# Patient Record
Sex: Female | Born: 1955 | Race: Black or African American | Hispanic: No | Marital: Single | Smoking: Never smoker
Health system: Southern US, Community
[De-identification: ages and names within clinical notes are randomized; demographics above are authoritative.]

## PROBLEM LIST (undated history)

## (undated) DIAGNOSIS — I639 Cerebral infarction, unspecified: Secondary | ICD-10-CM

## (undated) DIAGNOSIS — I10 Essential (primary) hypertension: Secondary | ICD-10-CM

## (undated) DIAGNOSIS — E7800 Pure hypercholesterolemia, unspecified: Secondary | ICD-10-CM

## (undated) HISTORY — PX: APPENDECTOMY: SHX54

## (undated) HISTORY — PX: ABDOMINAL HYSTERECTOMY: SHX81

## (undated) HISTORY — DX: Cerebral infarction, unspecified: I63.9

---

## 2014-06-13 ENCOUNTER — Emergency Department (HOSPITAL_BASED_OUTPATIENT_CLINIC_OR_DEPARTMENT_OTHER)
Admission: EM | Admit: 2014-06-13 | Discharge: 2014-06-13 | Disposition: A | Discharge status: Home / Self Care | Payer: No Typology Code available for payment source | Attending: Emergency Medicine | Admitting: Emergency Medicine

## 2014-06-13 ENCOUNTER — Encounter (HOSPITAL_BASED_OUTPATIENT_CLINIC_OR_DEPARTMENT_OTHER): Payer: Self-pay | Admitting: Emergency Medicine

## 2014-06-13 DIAGNOSIS — E7800 Pure hypercholesterolemia, unspecified: Secondary | ICD-10-CM | POA: Insufficient documentation

## 2014-06-13 DIAGNOSIS — T465X5A Adverse effect of other antihypertensive drugs, initial encounter: Secondary | ICD-10-CM | POA: Insufficient documentation

## 2014-06-13 DIAGNOSIS — I10 Essential (primary) hypertension: Secondary | ICD-10-CM | POA: Insufficient documentation

## 2014-06-13 DIAGNOSIS — T783XXA Angioneurotic edema, initial encounter: Secondary | ICD-10-CM | POA: Insufficient documentation

## 2014-06-13 DIAGNOSIS — R221 Localized swelling, mass and lump, neck: Secondary | ICD-10-CM

## 2014-06-13 DIAGNOSIS — R220 Localized swelling, mass and lump, head: Secondary | ICD-10-CM | POA: Insufficient documentation

## 2014-06-13 DIAGNOSIS — Z79899 Other long term (current) drug therapy: Secondary | ICD-10-CM | POA: Diagnosis not present

## 2014-06-13 HISTORY — DX: Pure hypercholesterolemia, unspecified: E78.00

## 2014-06-13 HISTORY — DX: Essential (primary) hypertension: I10

## 2014-06-13 NOTE — ED Provider Notes (Addendum)
CSN: [id number]     Arrival date & time 06/13/14  0272 History   First MD Initiated Contact with Patient 06/13/14 0708     No chief complaint on file.  Chief complaint swelling of face  (Consider location/radiation/quality/duration/timing/severity/associated sxs/prior Treatment) HPI Patient noticed swelling of her face and lips onset yesterday 12:15 PM. She was seen at her PMDs office. Lisinopril was stopped. She was administered Depo-Medrol, also told to take Benadryl. She presents today as swelling continues. She denies difficulty speaking, swallowing or breathing. No other associated symptoms. She feels slightly worse today. No past medical history on file. past medical history hypertension hypercholesterolemia No past surgical history on file. No family history on file. History  Substance Use Topics  . Smoking status: Not on file  . Smokeless tobacco: Not on file  . Alcohol Use: Not on file   no tobacco no alcohol no drug OB History   No data available     Review of Systems  Constitutional: Negative.   HENT: Positive for facial swelling.   Respiratory: Negative.   Cardiovascular: Negative.   Gastrointestinal: Negative.   Musculoskeletal: Negative.   Skin: Negative.   Neurological: Negative.   Psychiatric/Behavioral: Negative.   All other systems reviewed and are negative.     Allergies  Review of patient's allergies indicates not on file.  Home Medications  Atorvastatin, Zantac, Norvasc Benadryl, HCTZ,lisinopril; Prior to Admission medications   Not on File   BP 163/92  Pulse 100  Temp(Src) 98.2 F (36.8 C) (Oral)  Resp 18  Ht  (1.626 m)  Wt 180 lb (81.647 kg)  BMI 30.88 kg/m2  SpO2 100% Physical Exam  Nursing note and vitals reviewed. Constitutional: She appears well-developed and well-nourished. No distress.  Healing secretions well no distress  HENT:  Head: Normocephalic and atraumatic.  Right Ear: External ear normal.  Left Ear: External ear  normal.  Mouth/Throat: Oropharynx is clear and moist.  Lips swollen cheeks are slightly swollen. No swelling of tongue or uvula  Eyes: Conjunctivae are normal. Pupils are equal, round, and reactive to light.  Neck: Neck supple. No tracheal deviation present. No thyromegaly present.  Cardiovascular: Normal rate and regular rhythm.   No murmur heard. Pulmonary/Chest: Effort normal and breath sounds normal.  Speaks in paragraphs no respiratory distress  Abdominal: Soft. Bowel sounds are normal. She exhibits no distension. There is no tenderness.  Musculoskeletal: Normal range of motion. She exhibits no edema and no tenderness.  Neurological: She is alert. Coordination normal.  Skin: Skin is warm and dry. No rash noted.  Psychiatric: She has a normal mood and affect.    ED Course  Procedures (including critical care time) Labs Review Labs Reviewed - No data to display  Imaging Review No results found.   EKG Interpretation None      MDM  No further treatment indicated presently. I've advised patient to watch for difficulty swallowing breathing or speaking. Return immediately if she develops any of those problems .She is to stay off lisinopril. Final diagnoses:  None   Diagnosis angioedema     Doug Sou, MD 06/13/14 5366  Doug Sou, MD 06/13/14 [phone]

## 2014-06-13 NOTE — Discharge Instructions (Signed)
Angioedema Your body is having a reaction to lisinopril. You cannot take more lisinopril or the class of medications known as ACE inhibitors. Return immediately for difficulty speaking swallowing or breathing. Angioedema is sudden puffiness (swelling), often of the skin. It can happen:  On your face or privates (genitals).  In your belly (abdomen) or other body parts. It usually happens quickly and gets better in 1 or 2 days. It often starts at night and is found when you wake up. You may get red, itchy patches of skin (hives). Attacks can be dangerous if your breathing passages get puffy. The condition may happen only once, or it can come back at random times. It may happen for several years before it goes away for good. HOME CARE  Only take medicines as told by your doctor.  Always carry your emergency allergy medicines with you.  Wear a medical bracelet as told by your doctor.  Avoid things that you know will cause attacks (triggers). GET HELP IF:  You have another attack.  Your attacks happen more often or get worse.  The condition was passed to you by your parents and you want to have children. GET HELP RIGHT AWAY IF:   Your mouth, tongue, or lips are very puffy.  You have trouble breathing.  You have trouble swallowing.  You pass out (faint). MAKE SURE YOU:   Understand these instructions.  Will watch your condition.  Will get help right away if you are not doing well or get worse. Document Released: 09/13/2009 Document Revised: 07/16/2013 Document Reviewed: 05/19/2013 [facility] Patient Information 2015 [place], [place]. This information is not intended to replace advice given to you by your health care provider. Make sure you discuss any questions you have with your health care provider.

## 2014-06-13 NOTE — ED Notes (Signed)
Lip swelling secondary to lisinopril. Went to dr yesterday for same.

## 2021-01-23 ENCOUNTER — Observation Stay (HOSPITAL_COMMUNITY)
Admission: EM | Admit: 2021-01-23 | Discharge: 2021-01-24 | Disposition: A | Discharge status: Home / Self Care | Payer: Self-pay | Attending: Emergency Medicine | Admitting: Emergency Medicine

## 2021-01-23 ENCOUNTER — Encounter (HOSPITAL_COMMUNITY): Payer: Self-pay

## 2021-01-23 ENCOUNTER — Emergency Department (HOSPITAL_COMMUNITY): Payer: Self-pay

## 2021-01-23 ENCOUNTER — Other Ambulatory Visit: Payer: Self-pay

## 2021-01-23 DIAGNOSIS — Z79899 Other long term (current) drug therapy: Secondary | ICD-10-CM | POA: Insufficient documentation

## 2021-01-23 DIAGNOSIS — R112 Nausea with vomiting, unspecified: Secondary | ICD-10-CM | POA: Insufficient documentation

## 2021-01-23 DIAGNOSIS — E876 Hypokalemia: Secondary | ICD-10-CM | POA: Insufficient documentation

## 2021-01-23 DIAGNOSIS — G43829 Menstrual migraine, not intractable, without status migrainosus: Secondary | ICD-10-CM

## 2021-01-23 DIAGNOSIS — I1A0 Resistant hypertension: Secondary | ICD-10-CM

## 2021-01-23 DIAGNOSIS — E785 Hyperlipidemia, unspecified: Secondary | ICD-10-CM

## 2021-01-23 DIAGNOSIS — R42 Dizziness and giddiness: Principal | ICD-10-CM | POA: Insufficient documentation

## 2021-01-23 DIAGNOSIS — G43909 Migraine, unspecified, not intractable, without status migrainosus: Secondary | ICD-10-CM

## 2021-01-23 DIAGNOSIS — R519 Headache, unspecified: Secondary | ICD-10-CM

## 2021-01-23 DIAGNOSIS — I10 Essential (primary) hypertension: Secondary | ICD-10-CM | POA: Insufficient documentation

## 2021-01-23 DIAGNOSIS — Z20822 Contact with and (suspected) exposure to covid-19: Secondary | ICD-10-CM | POA: Insufficient documentation

## 2021-01-23 LAB — CBC WITH DIFFERENTIAL/PLATELET
Abs Immature Granulocytes: 0.09 10*3/uL — ABNORMAL HIGH (ref 0.00–0.07)
Basophils Absolute: 0 10*3/uL (ref 0.0–0.1)
Basophils Relative: 0 %
Eosinophils Absolute: 0 10*3/uL (ref 0.0–0.5)
Eosinophils Relative: 0 %
HCT: 38.7 % (ref 36.0–46.0)
Hemoglobin: 12.4 g/dL (ref 12.0–15.0)
Immature Granulocytes: 1 %
Lymphocytes Relative: 7 %
Lymphs Abs: 1 10*3/uL (ref 0.7–4.0)
MCH: 28.4 pg (ref 26.0–34.0)
MCHC: 32 g/dL (ref 30.0–36.0)
MCV: 88.6 fL (ref 80.0–100.0)
Monocytes Absolute: 0.7 10*3/uL (ref 0.1–1.0)
Monocytes Relative: 5 %
Neutro Abs: 13.7 10*3/uL — ABNORMAL HIGH (ref 1.7–7.7)
Neutrophils Relative %: 87 %
Platelets: 389 10*3/uL (ref 150–400)
RBC: 4.37 MIL/uL (ref 3.87–5.11)
RDW: 16.8 % — ABNORMAL HIGH (ref 11.5–15.5)
WBC: 15.6 10*3/uL — ABNORMAL HIGH (ref 4.0–10.5)
nRBC: 0 % (ref 0.0–0.2)

## 2021-01-23 LAB — COMPREHENSIVE METABOLIC PANEL
ALT: 26 U/L (ref 0–44)
AST: 23 U/L (ref 15–41)
Albumin: 3.9 g/dL (ref 3.5–5.0)
Alkaline Phosphatase: 77 U/L (ref 38–126)
Anion gap: 11 (ref 5–15)
BUN: 11 mg/dL (ref 8–23)
CO2: 25 mmol/L (ref 22–32)
Calcium: 9.5 mg/dL (ref 8.9–10.3)
Chloride: 102 mmol/L (ref 98–111)
Creatinine, Ser: 0.74 mg/dL (ref 0.44–1.00)
GFR, Estimated: >60 mL/min (ref >60)
Glucose, Bld: 186 mg/dL — ABNORMAL HIGH (ref 70–99)
Potassium: 3.3 mmol/L — ABNORMAL LOW (ref 3.5–5.1)
Sodium: 138 mmol/L (ref 135–145)
Total Bilirubin: 0.5 mg/dL (ref 0.3–1.2)
Total Protein: 8.6 g/dL — ABNORMAL HIGH (ref 6.5–8.1)

## 2021-01-23 LAB — URINALYSIS, ROUTINE W REFLEX MICROSCOPIC
Bilirubin Urine: NEGATIVE
Glucose, UA: 150 mg/dL — AB
Hgb urine dipstick: NEGATIVE
Ketones, ur: 5 mg/dL — AB
Leukocytes,Ua: NEGATIVE
Nitrite: NEGATIVE
Protein, ur: NEGATIVE mg/dL
Specific Gravity, Urine: 1.011 (ref 1.005–1.030)
pH: 7 (ref 5.0–8.0)

## 2021-01-23 LAB — RESP PANEL BY RT-PCR (FLU A&B, COVID) ARPGX2
Influenza A by PCR: NEGATIVE
Influenza B by PCR: NEGATIVE
SARS Coronavirus 2 by RT PCR: NEGATIVE

## 2021-01-23 LAB — TROPONIN I (HIGH SENSITIVITY)
Troponin I (High Sensitivity): 4 ng/L (ref <18)
Troponin I (High Sensitivity): 3 ng/L (ref <18)

## 2021-01-23 IMAGING — CR DG CHEST 2V
2 series · 2 of 2 positions shown · non-contrast
Comparison: None.

CLINICAL DATA: Shortness of breath, dizziness

EXAM:
CHEST - 2 VIEW

[chest lat]
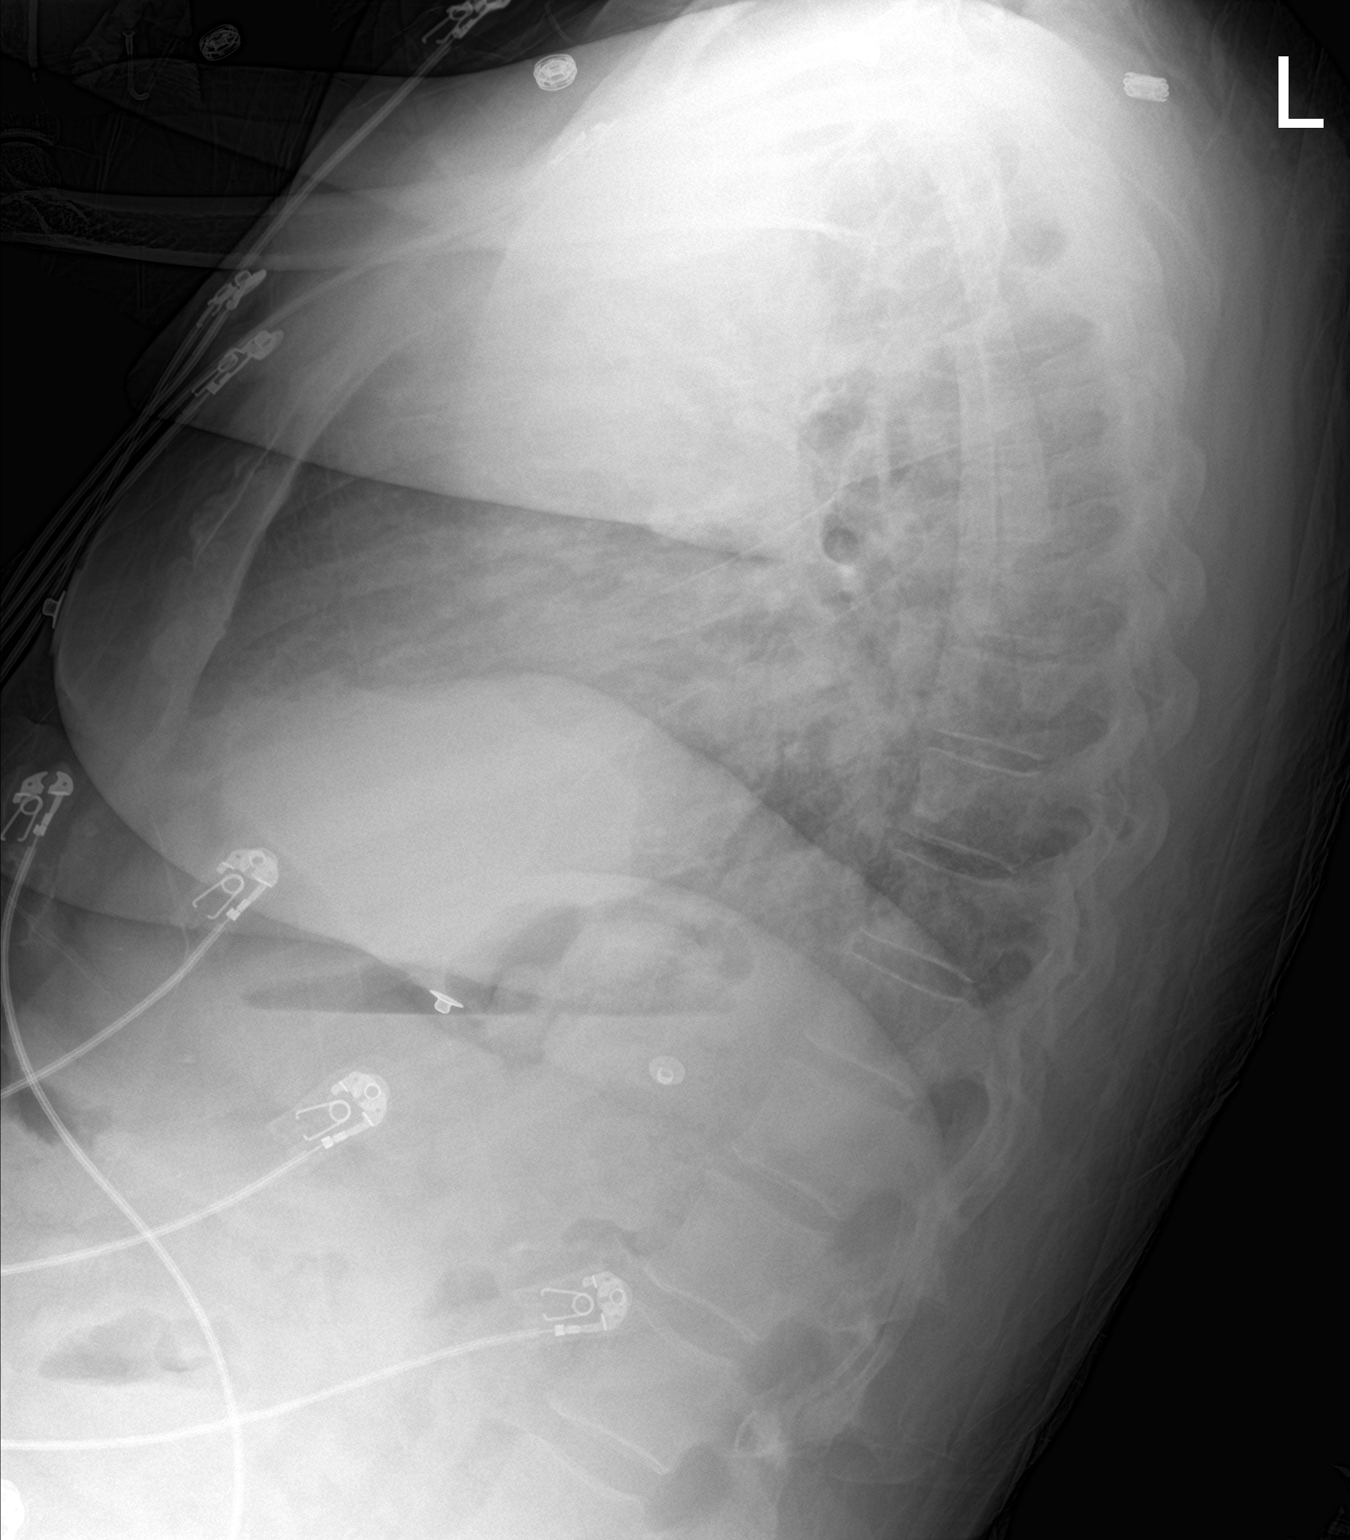

[chest ap]
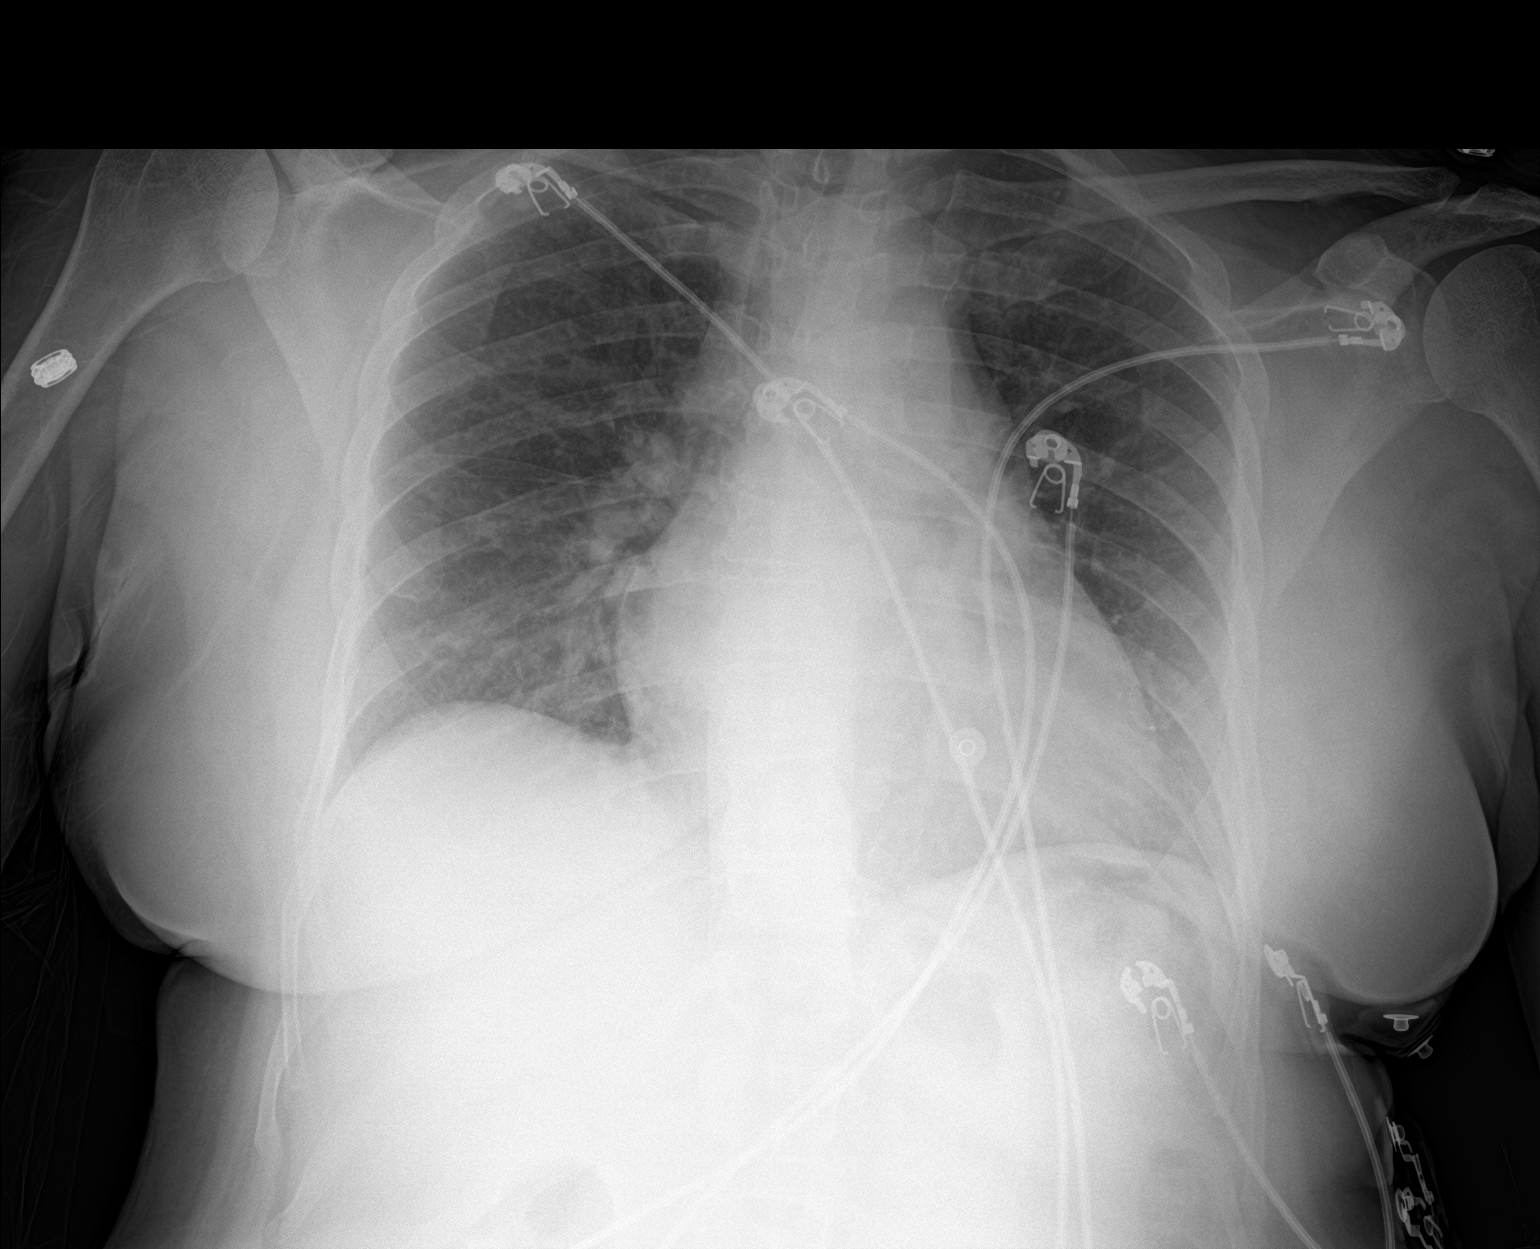

[2 of 2 positions shown; findings below may reference images not displayed]

FINDINGS: Prominent cardiac silhouette. Some mild pulmonary vascular
congestion with indistinct vascularity as well as perihilar hazy
opacity fissural thickening. No pneumothorax or layering pleural
effusion. No acute osseous or soft tissue abnormality. Telemetry
leads overlie the chest.
IMPRESSION: Prominent cardiac silhouette, possibly accentuated by portable
technique though could reflect cardiomegaly particularly given the
presence of some pulmonary vascular congestion and hazy perihilar
predominant opacity which could reflect early developing. Appearance
suggestive of CHF/volume. Correlate with clinical symptoms.

## 2021-01-23 IMAGING — CT CT HEAD W/O CM
4 series · 16 of 47 positions shown, 18 images · non-contrast
Comparison: None.

CLINICAL DATA: Dizziness

EXAM:
CT HEAD WITHOUT CONTRAST
TECHNIQUE: Contiguous axial images were obtained from the base of the skull
through the vertex without intravenous contrast.

[Series 3: head wo · axial · 0.41mm/px · z∈[-106,-6]mm · 7 of 28 slices shown, 9 images]
[im 4/28  brain]
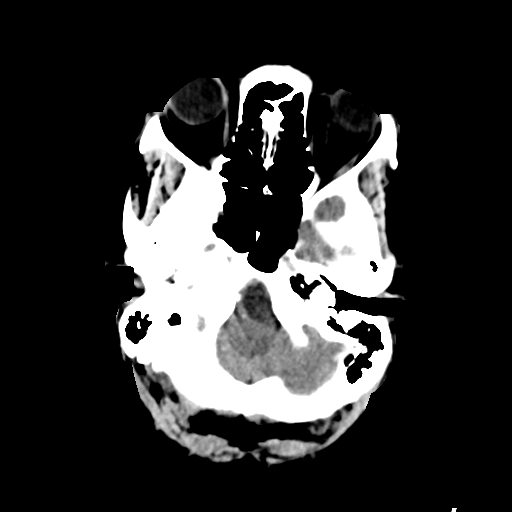
[im 4/28  bone]
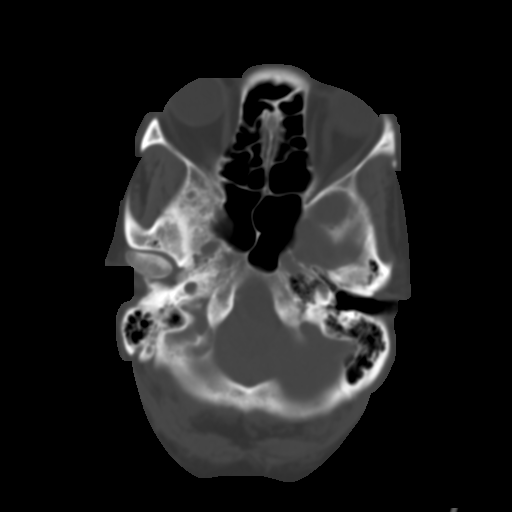
[im 7/28  brain]
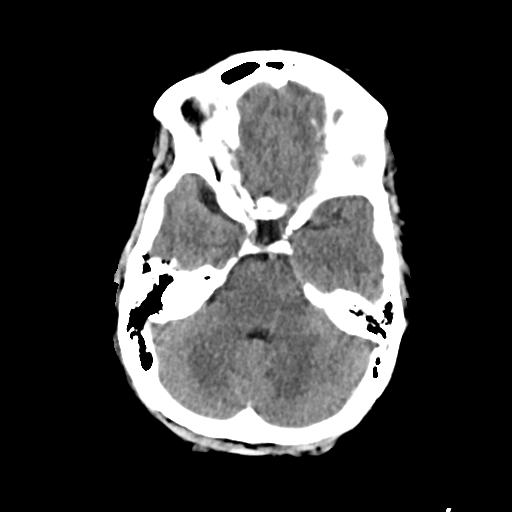
[im 11/28  brain]
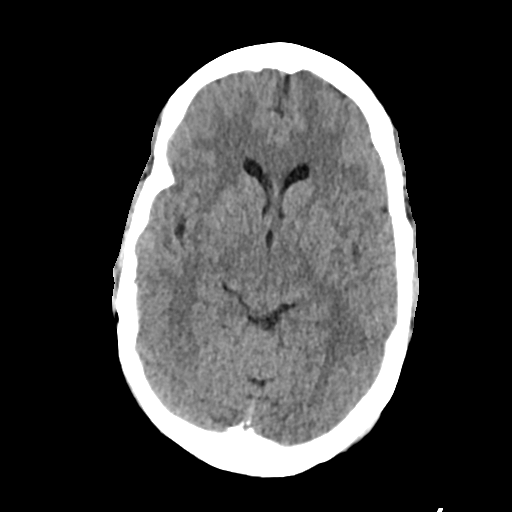
[im 14/28  brain]
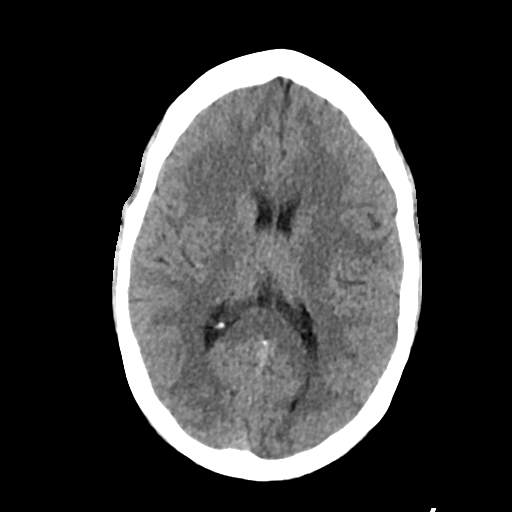
[im 17/28  brain]
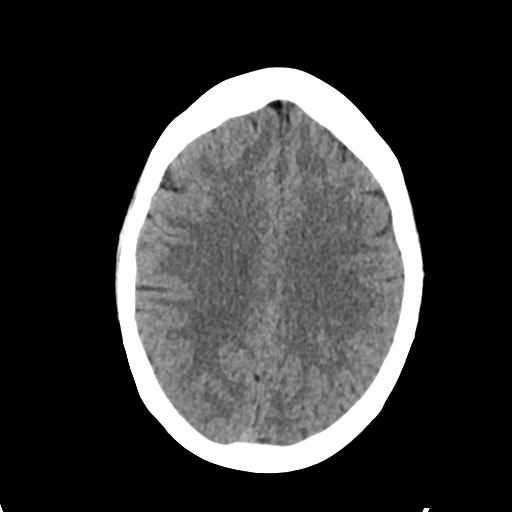
[im 17/28  bone]
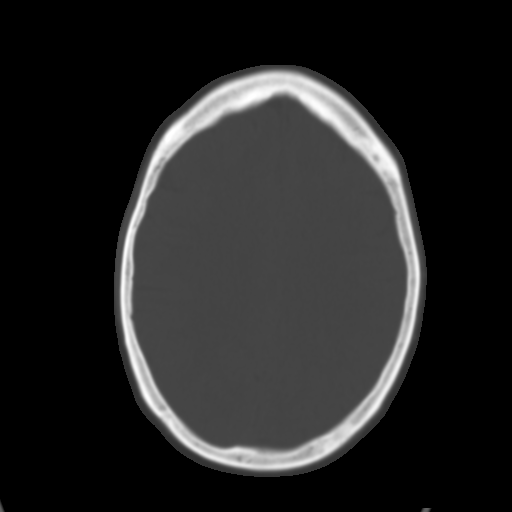
[im 21/28  brain]
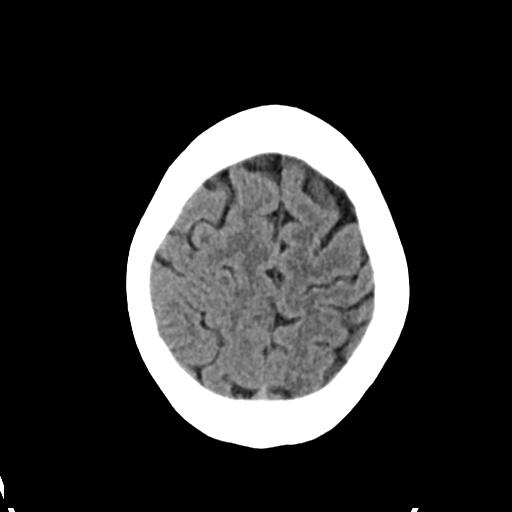
[im 24/28  brain]
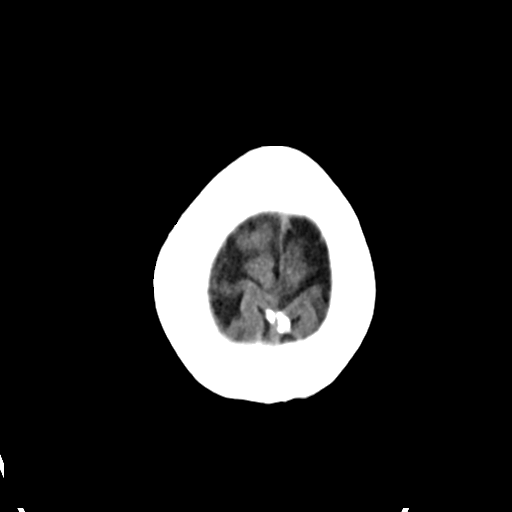

[Series 4: head bone · axial · 0.41mm/px · z∈[-109,-81]mm · 3 of 70 slices shown]
[im 7/70  bone]
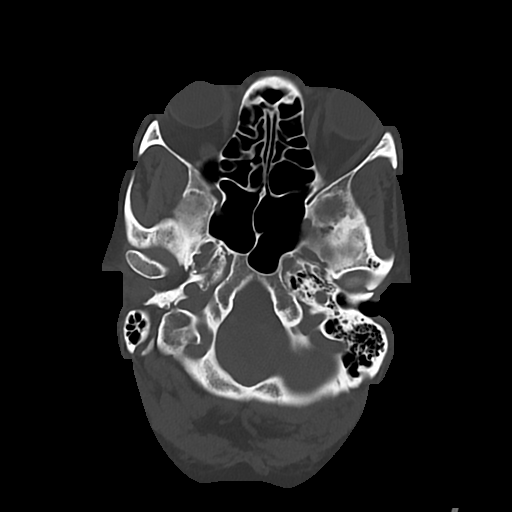
[im 14/70  bone]
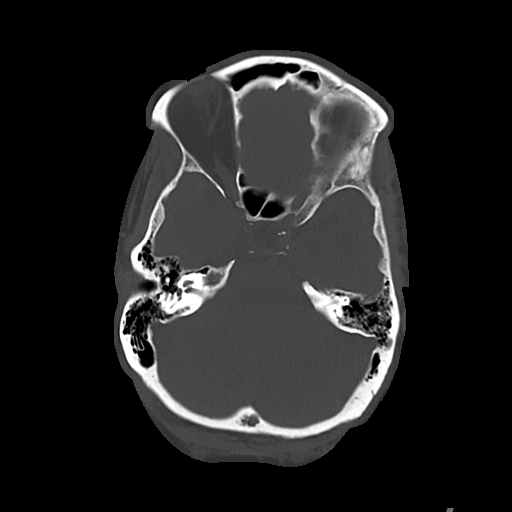
[im 21/70  bone]
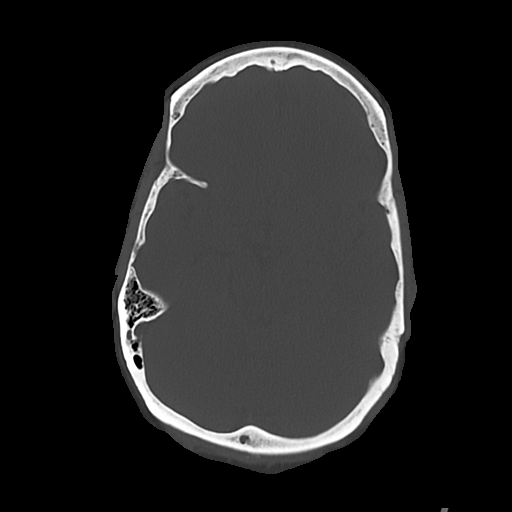

[Series 5: cor soft · coronal · 0.29mm/px · 3 of 68 slices shown]
[im 23/68  brain]
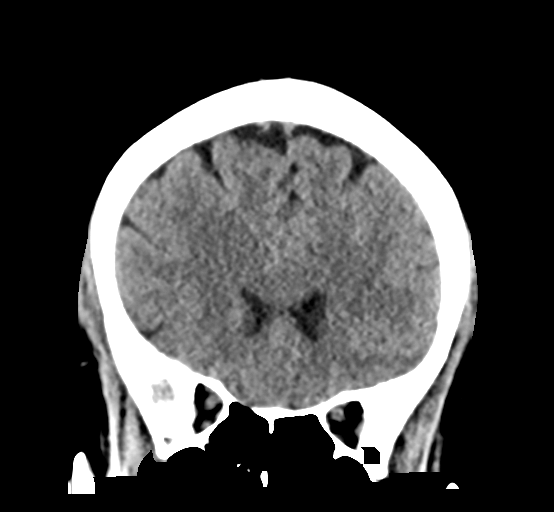
[im 30/68  brain]
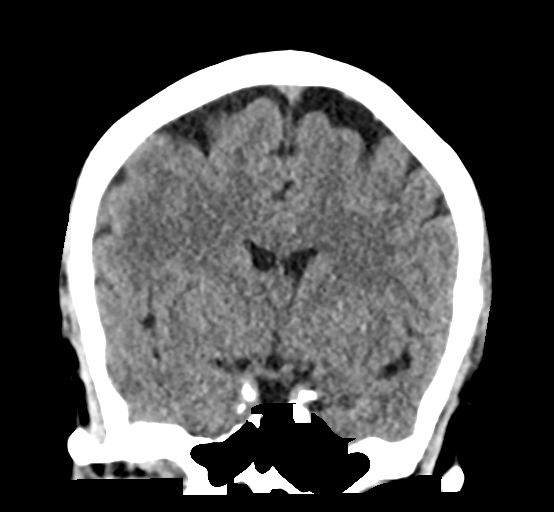
[im 38/68  brain]
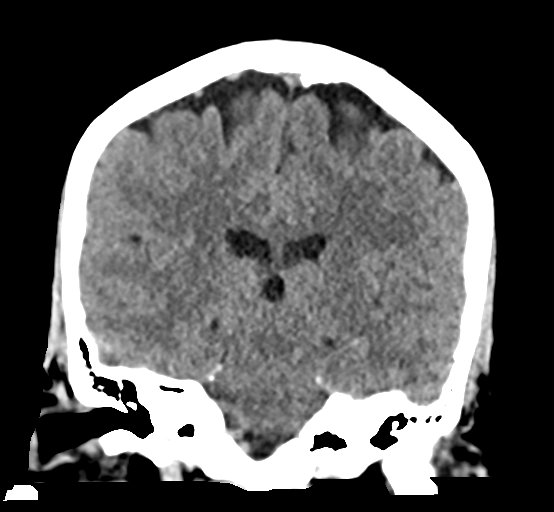

[Series 6: sag soft · sagittal · 0.29mm/px · 3 of 54 slices shown]
[im 18/54  brain]
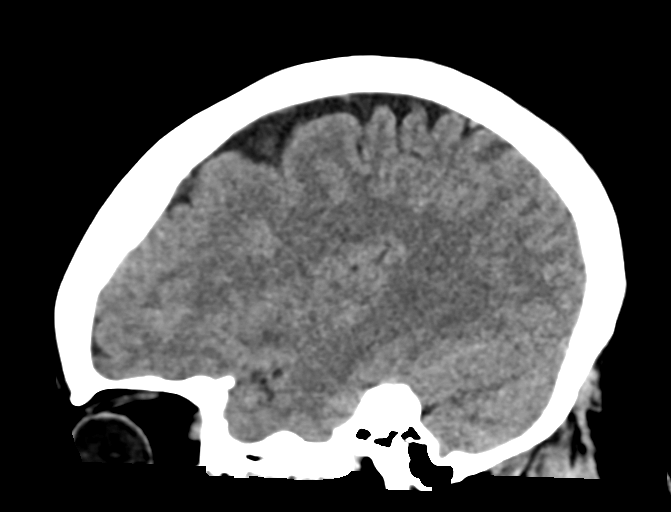
[im 27/54  brain]
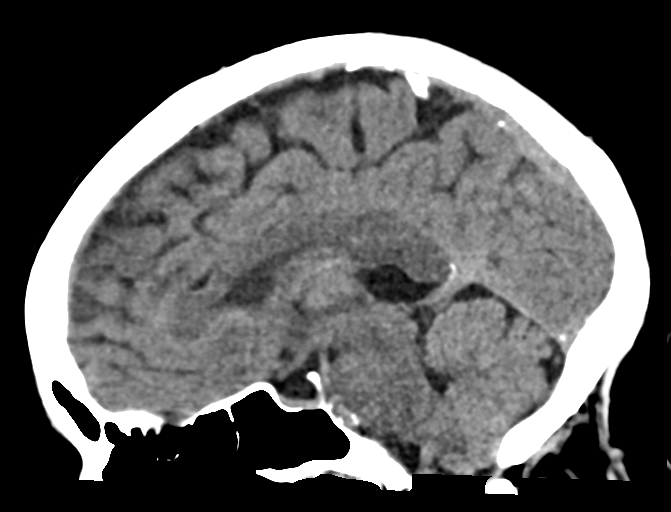
[im 36/54  brain]
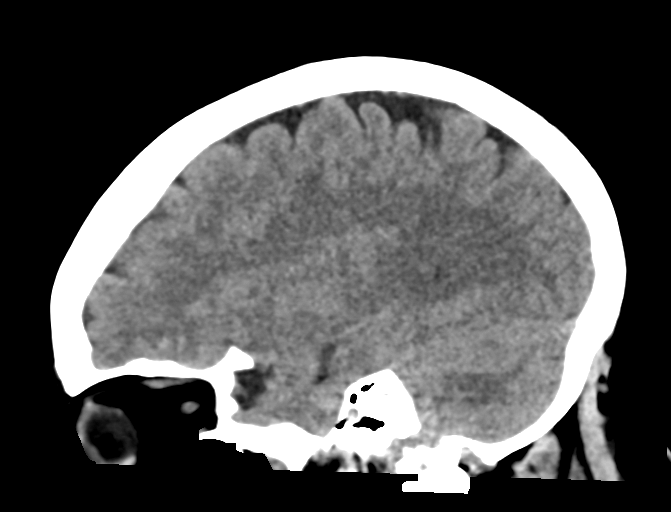

[16 of 47 positions shown; findings below may reference images not displayed]

FINDINGS: Brain: No evidence of acute infarction, hemorrhage, hydrocephalus,
extra-axial collection, visible mass lesion or mass effect.
Symmetric prominence of the ventricles, cisterns and sulci
compatible with parenchymal volume loss. Patchy areas of white
matter hypoattenuation are most compatible with chronic
microvascular angiopathy.

Vascular: No evidence of acute infarction, hemorrhage,
hydrocephalus, extra-axial collection, visible mass lesion or mass
effect.

Skull: No calvarial fracture or suspicious osseous lesion. No scalp
swelling or hematoma.

Sinuses/Orbits: Paranasal sinuses and mastoid air cells are
predominantly clear. Included orbital structures are unremarkable.

Other: None
IMPRESSION: 1. No acute intracranial findings.
2. Mild parenchymal volume loss and chronic microvascular
angiopathy.

## 2021-01-23 IMAGING — MR MR HEAD W/O CM
9 of 10 series · 41 of 48 positions shown · non-contrast
Comparison: Head CT from earlier today

CLINICAL DATA: Central vertigo

EXAM:
MRI HEAD WITHOUT CONTRAST
TECHNIQUE: Multiplanar, multiecho pulse sequences of the brain and surrounding
structures were obtained without intravenous contrast.

[Series 5: DWI · axial · 3.0mm · 0.88mm/px · z∈[-64,+69]mm · 10 of 96 slices shown (1 of 4)]
[im 1/96]
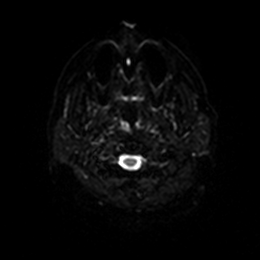
[im 11/96]
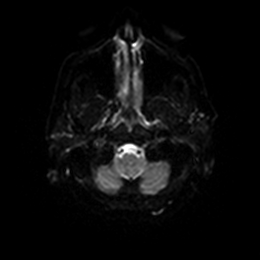
[im 22/96]
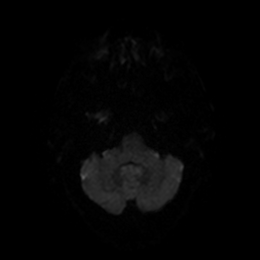
[im 32/96]
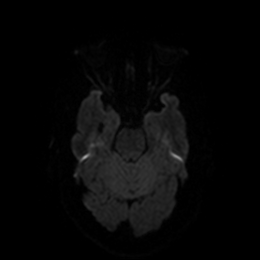
[im 43/96]
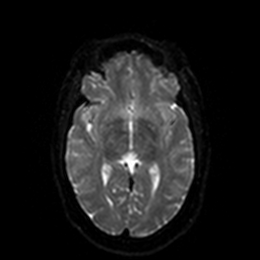
[im 53/96]
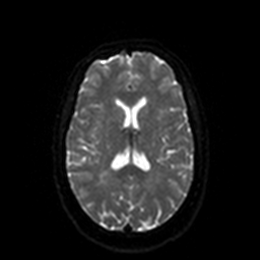
[im 64/96]
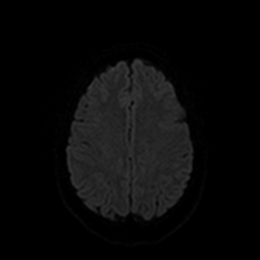
[im 74/96]
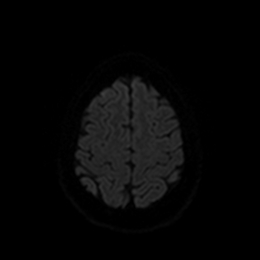
[im 85/96]
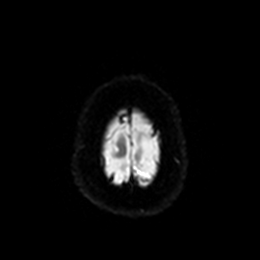
[im 96/96]
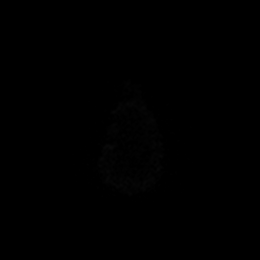

[Series 6: DWI · axial · 3.0mm · 0.88mm/px · z∈[-64,+69]mm · 4 of 48 slices shown (2 of 4)]
[im 1/48]
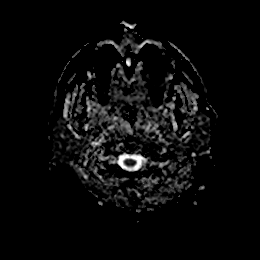
[im 16/48]
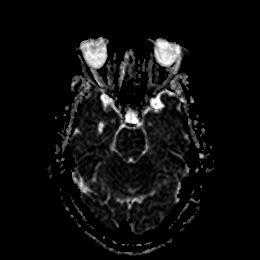
[im 32/48]
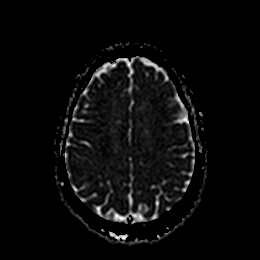
[im 48/48]
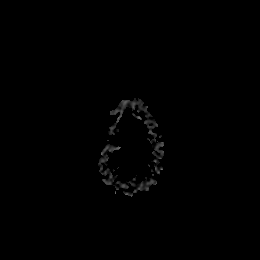

[Series 7: DWI · coronal · 4.0mm · 0.88mm/px · 8 of 68 slices shown (3 of 4)]
[im 1/68]
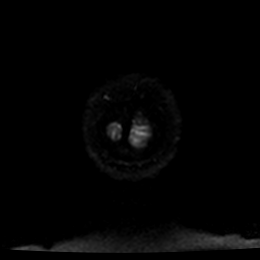
[im 10/68]
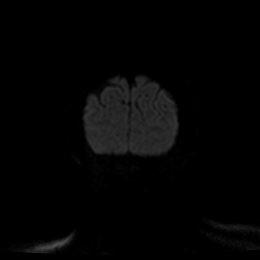
[im 20/68]
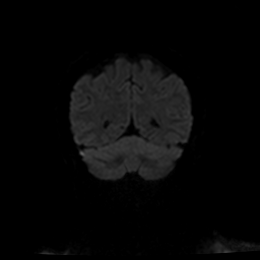
[im 29/68]
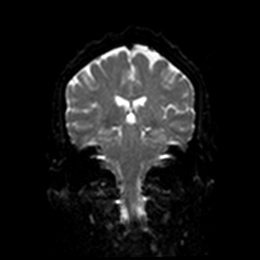
[im 39/68]
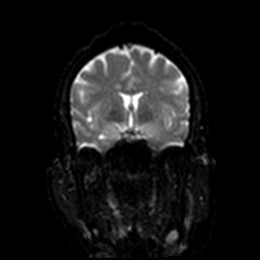
[im 48/68]
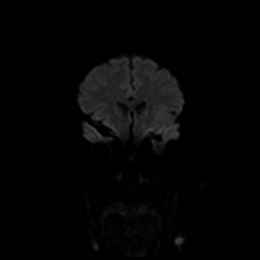
[im 58/68]
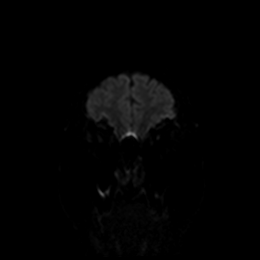
[im 68/68]
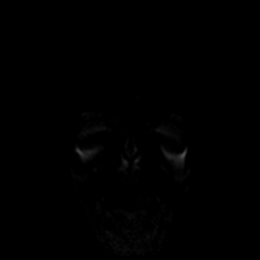

[Series 8: DWI · coronal · 4.0mm · 0.88mm/px · 4 of 34 slices shown (4 of 4)]
[im 1/34]
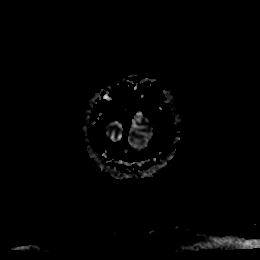
[im 12/34]
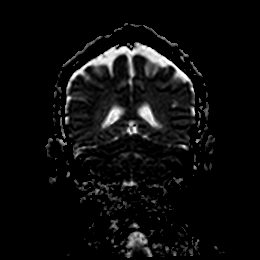
[im 23/34]
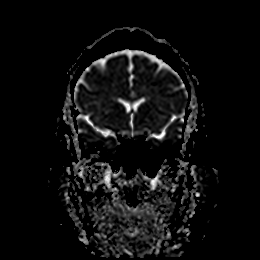
[im 34/34]
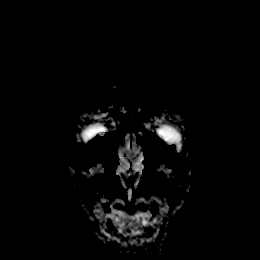

[Series 10: T2 · axial · 5.0mm · 0.72mm/px · z∈[-80,+60]mm · 3 of 25 slices shown (1 of 2)]
[im 1/25]
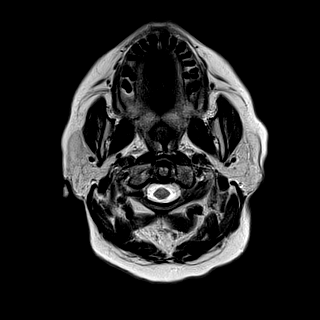
[im 13/25]
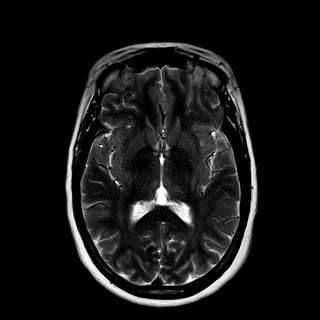
[im 25/25]
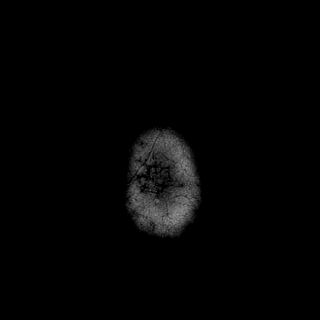

[Series 18: T1 · sagittal · 5.0mm · 0.90mm/px · 3 of 25 slices shown]
[im 1/25]
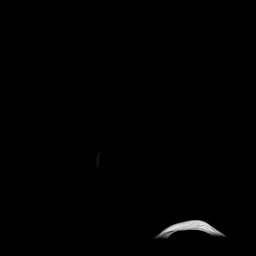
[im 13/25]
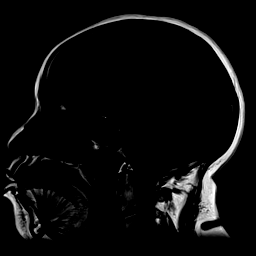
[im 25/25]
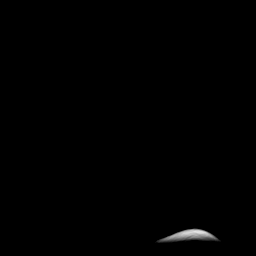

[Series 19: FLAIR · axial · 5.0mm · 0.90mm/px · z∈[-80,+60]mm · 3 of 25 slices shown]
[im 1/25]
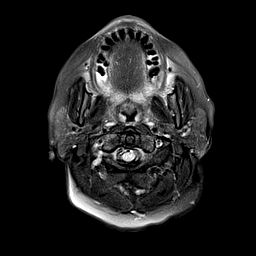
[im 13/25]
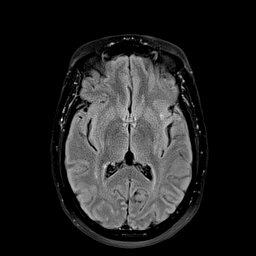
[im 25/25]
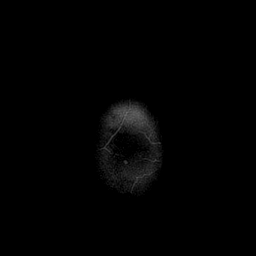

[Series 20: ax hemo · axial · 5.0mm · 0.86mm/px · z∈[-69,+67]mm · 3 of 25 slices shown]
[im 1/25]
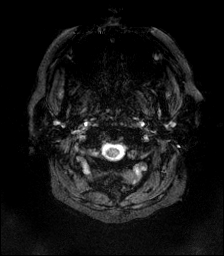
[im 13/25]
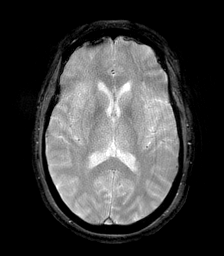
[im 25/25]
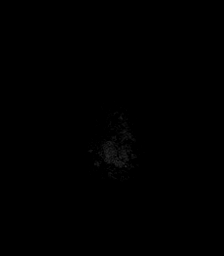

[Series 21: T2 · coronal · 5.0mm · 0.72mm/px · 3 of 29 slices shown (2 of 2)]
[im 1/29]
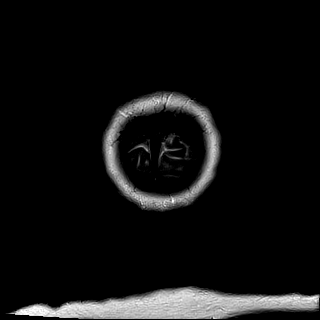
[im 15/29]
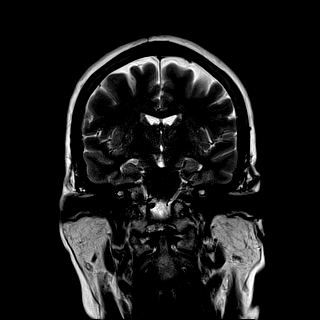
[im 29/29]
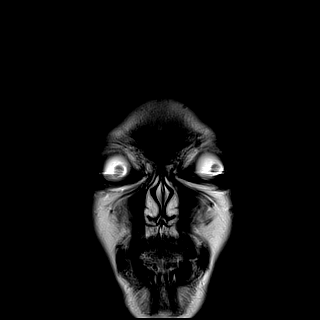

[41 of 48 positions shown; findings below may reference images not displayed]

FINDINGS: Brain: No acute infarction, hemorrhage, hydrocephalus, extra-axial
collection or mass lesion. Mild chronic small vessel ischemic type
change in the cerebral white matter. Normal brain volume.

Vascular: Preserved flow voids, including vertebrobasilar.

Skull and upper cervical spine: Normal marrow signal

Sinuses/Orbits: No acute finding
IMPRESSION: Negative for infarct or other acute finding.

## 2021-01-23 IMAGING — CT CT ANGIO HEAD
2 of 9 series · 16 of 47 positions shown · IV contrast (omnipaque)
Comparison: Brain MRI from earlier today

CLINICAL DATA: Dizziness and headache since this afternoon

EXAM:
CT ANGIOGRAPHY HEAD
TECHNIQUE: Multidetector CT imaging of the head was performed using the
standard protocol during bolus administration of intravenous
contrast. Multiplanar CT image reconstructions and MIPs were
obtained to evaluate the vascular anatomy.
CONTRAST:  75mL OMNIPAQUE IOHEXOL 350 MG/ML SOLN

[Series 7: headangio 1.0 mpr cor · coronal · 0.31mm/px · 3 of 201 slices shown]
[im 58/201  brain]
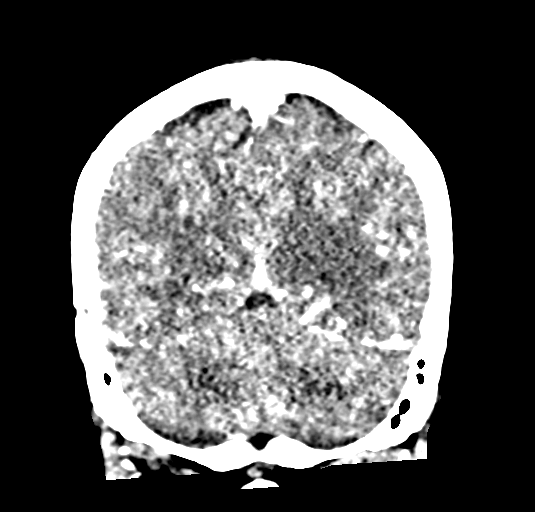
[im 86/201  brain]
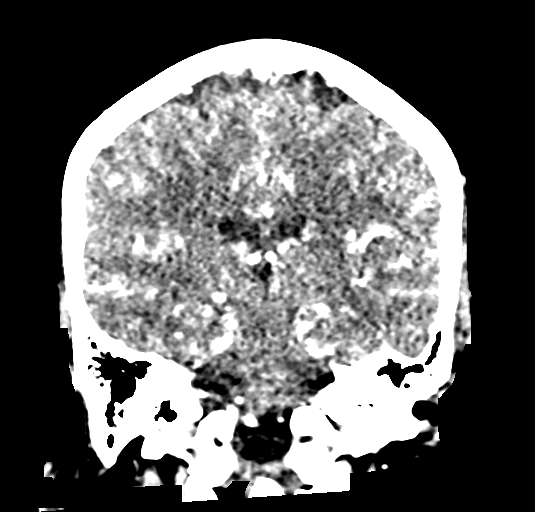
[im 115/201  brain]
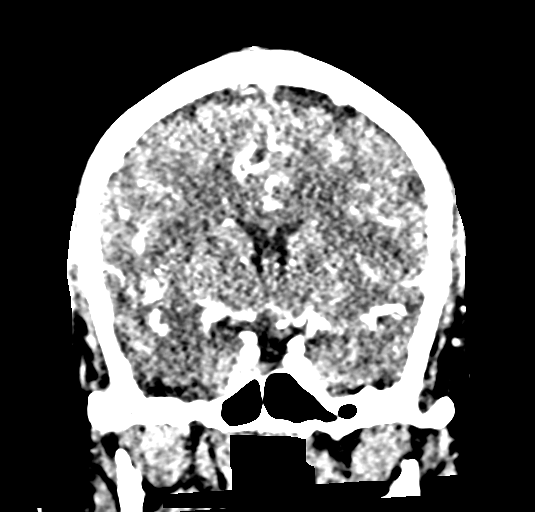

[Series 8: headangio 1.0 mpr ax · axial · 0.32mm/px · z∈[-138,-0]mm · 13 of 159 slices shown]
[im 10/159  brain]
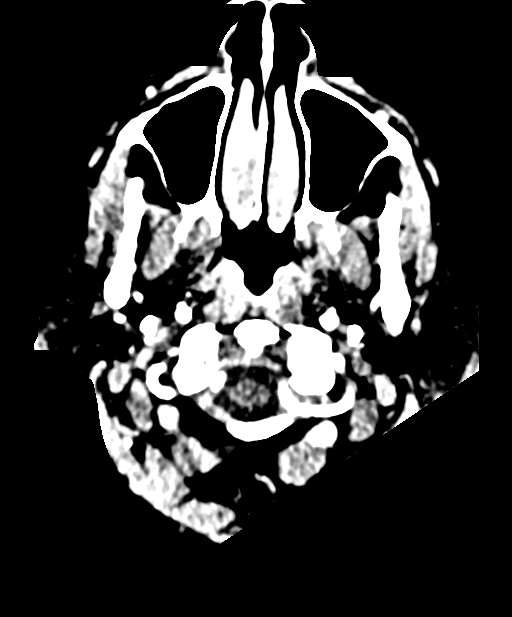
[im 20/159  bone]
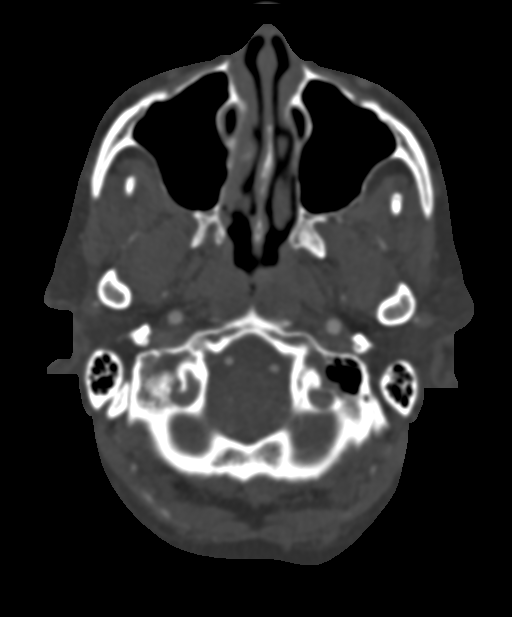
[im 30/159  brain]
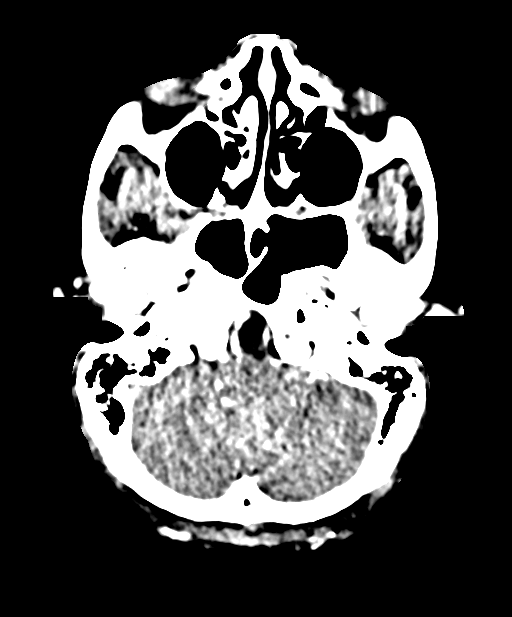
[im 50/159  bone]
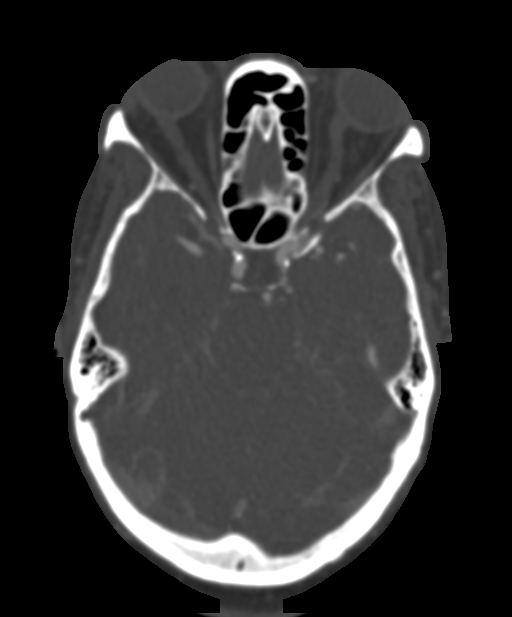
[im 60/159  brain]
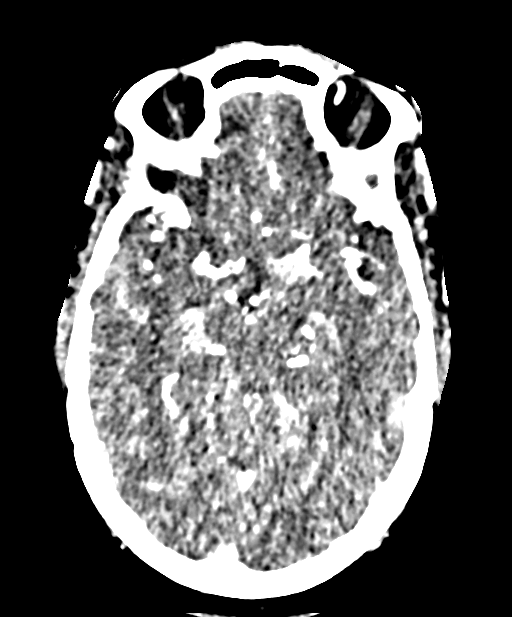
[im 70/159  bone]
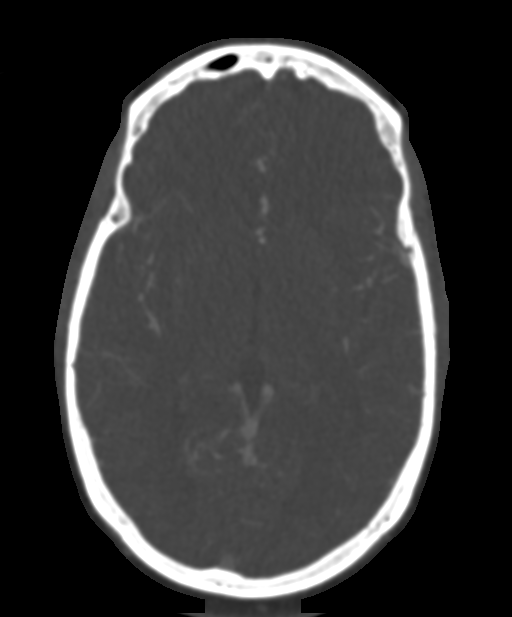
[im 80/159  brain]
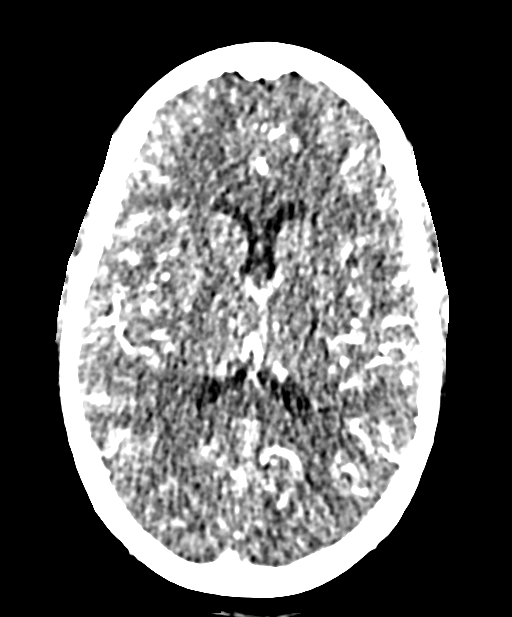
[im 89/159  bone]
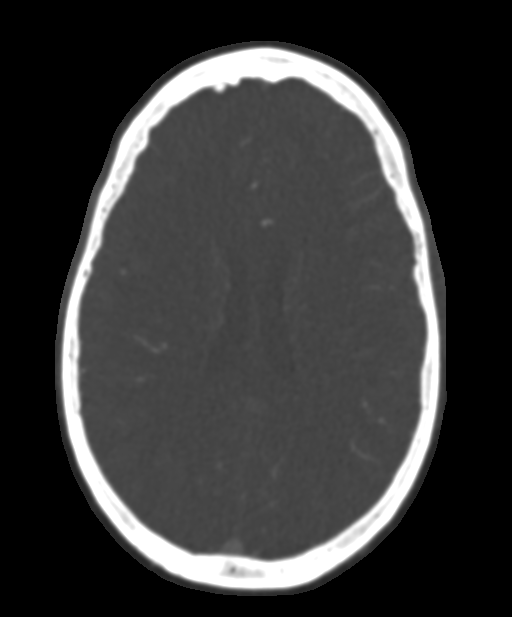
[im 99/159  brain]
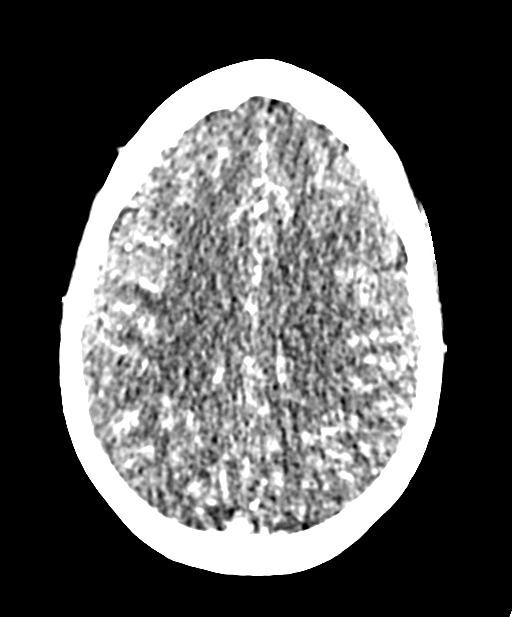
[im 109/159  bone]
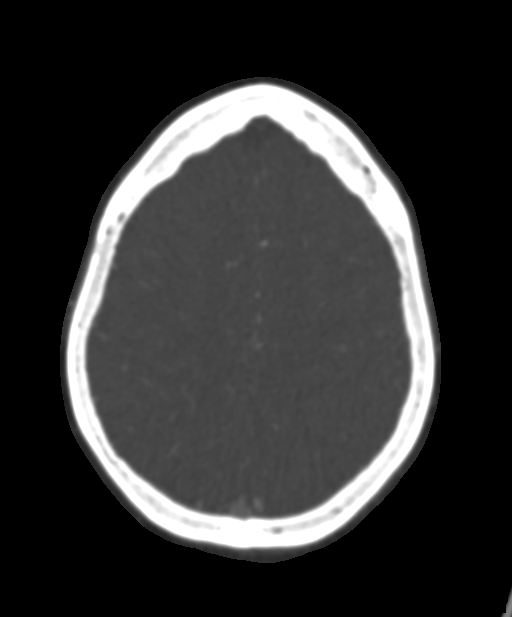
[im 129/159  brain]
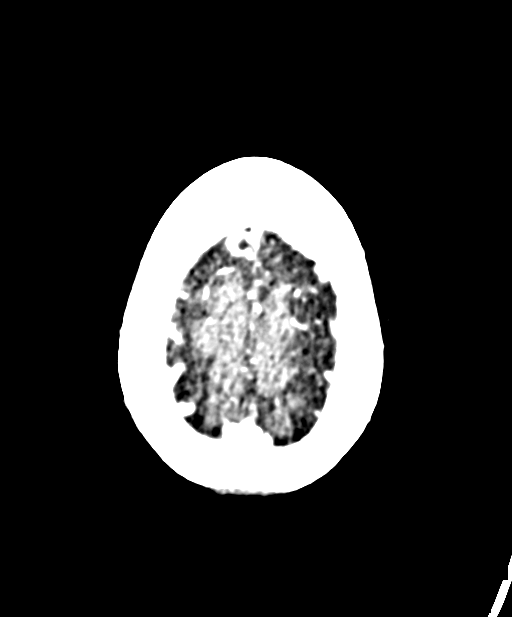
[im 139/159  bone]
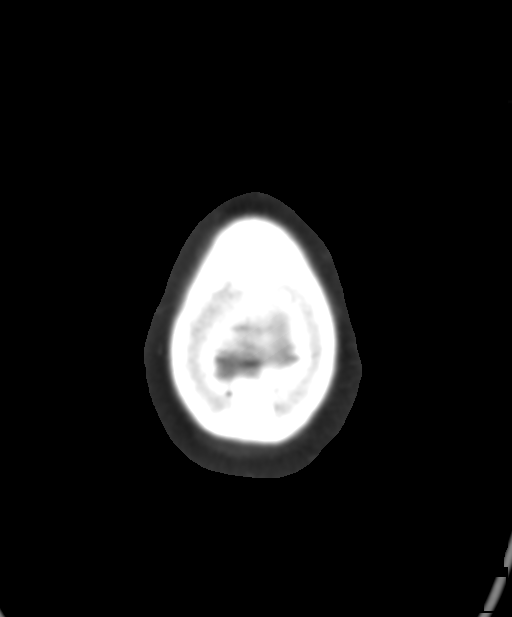
[im 149/159  brain]
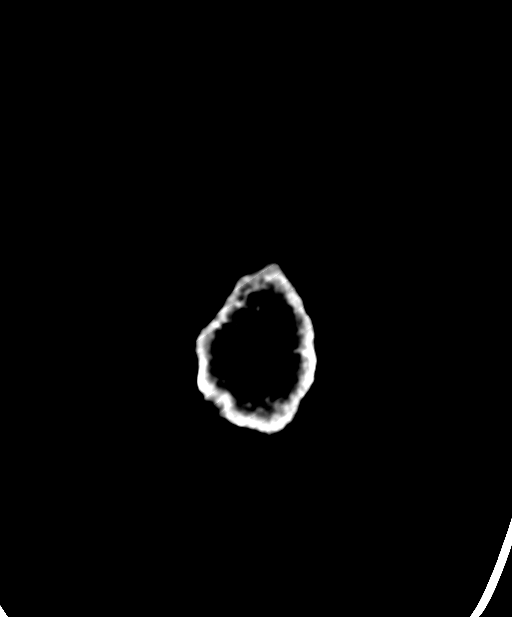

[16 of 47 positions shown; findings below may reference images not displayed]

FINDINGS: CT HEAD

Head CT was performed earlier the same day.

CTA HEAD

Anterior circulation: Atheromatous irregularity affecting bilateral
carotid siphons with 50% right paraclinoid segment stenosis. No
branch occlusion or flow limiting stenosis, generalized vessel
beading, or aneurysm seen.

Posterior circulation: Codominant vertebral arteries. Atheromatous
irregularity of bilateral distal V4 segments with advanced narrowing
on the left. Moderate distal basilar stenosis. Fetal type left PCA.
No branch occlusion or proximal flow limiting stenosis.

Venous sinuses: Unremarkable

Anatomic variants: As above

Review of the MIP images confirms the above findings.
IMPRESSION: 1. No emergent finding.
2. Intracranial atherosclerosis most heavily affecting the posterior
circulation with severe left V4 segment and moderate distal basilar
narrowings.

## 2021-01-23 MED ORDER — MECLIZINE HCL 25 MG PO TABS
25.0000 mg | ORAL_TABLET | Freq: Once | ORAL | Status: AC
  Administered 2021-01-23: 25 mg via ORAL
  Filled 2021-01-23: qty 1

## 2021-01-23 MED ORDER — AMLODIPINE BESYLATE 5 MG PO TABS
10.0000 mg | ORAL_TABLET | Freq: Once | ORAL | Status: AC
  Administered 2021-01-23: 10 mg via ORAL
  Filled 2021-01-23: qty 2

## 2021-01-23 MED ORDER — LOSARTAN POTASSIUM 50 MG PO TABS
50.0000 mg | ORAL_TABLET | Freq: Every day | ORAL | Status: DC
  Administered 2021-01-24: 50 mg via ORAL
  Filled 2021-01-23: qty 1

## 2021-01-23 MED ORDER — IOHEXOL 350 MG/ML SOLN
75.0000 mL | Freq: Once | INTRAVENOUS | Status: AC | PRN
  Administered 2021-01-23: 75 mL via INTRAVENOUS

## 2021-01-23 MED ORDER — ACETAMINOPHEN 325 MG PO TABS
650.0000 mg | ORAL_TABLET | Freq: Once | ORAL | Status: AC
  Administered 2021-01-23: 650 mg via ORAL
  Filled 2021-01-23: qty 2

## 2021-01-23 MED ORDER — ENOXAPARIN SODIUM 40 MG/0.4ML ~~LOC~~ SOLN
40.0000 mg | SUBCUTANEOUS | Status: DC
  Administered 2021-01-23: 40 mg via SUBCUTANEOUS
  Filled 2021-01-23: qty 0.4

## 2021-01-23 MED ORDER — HYDRALAZINE HCL 25 MG PO TABS
25.0000 mg | ORAL_TABLET | Freq: Once | ORAL | Status: AC
Start: 2021-01-23 — End: 2021-01-23
  Administered 2021-01-23: 25 mg via ORAL
  Filled 2021-01-23: qty 1

## 2021-01-23 MED ORDER — DROPERIDOL 2.5 MG/ML IJ SOLN
2.5000 mg | Freq: Once | INTRAMUSCULAR | Status: AC
  Administered 2021-01-23: 2.5 mg via INTRAVENOUS
  Filled 2021-01-23: qty 2

## 2021-01-23 MED ORDER — MECLIZINE HCL 12.5 MG PO TABS
12.5000 mg | ORAL_TABLET | Freq: Four times a day (QID) | ORAL | Status: DC | PRN
  Administered 2021-01-23 – 2021-01-24 (×2): 12.5 mg via ORAL
  Filled 2021-01-23 (×3): qty 1

## 2021-01-23 MED ORDER — SENNA 8.6 MG PO TABS
1.0000 | ORAL_TABLET | Freq: Two times a day (BID) | ORAL | Status: DC
  Administered 2021-01-23 – 2021-01-24 (×2): 8.6 mg via ORAL
  Filled 2021-01-23 (×2): qty 1

## 2021-01-23 MED ORDER — LACTATED RINGERS IV BOLUS
1000.0000 mL | Freq: Once | INTRAVENOUS | Status: AC
  Administered 2021-01-23: 1000 mL via INTRAVENOUS

## 2021-01-23 MED ORDER — PROCHLORPERAZINE EDISYLATE 10 MG/2ML IJ SOLN
10.0000 mg | Freq: Once | INTRAMUSCULAR | Status: AC
Start: 2021-01-23 — End: 2021-01-23
  Administered 2021-01-23: 10 mg via INTRAVENOUS
  Filled 2021-01-23: qty 2

## 2021-01-23 MED ORDER — POTASSIUM CHLORIDE CRYS ER 20 MEQ PO TBCR
40.0000 meq | EXTENDED_RELEASE_TABLET | Freq: Once | ORAL | Status: AC
  Administered 2021-01-23: 40 meq via ORAL
  Filled 2021-01-23: qty 2

## 2021-01-23 MED ORDER — ONDANSETRON HCL 4 MG/2ML IJ SOLN: 4.0000 mg | Freq: Four times a day (QID) | INTRAMUSCULAR | Status: DC | PRN

## 2021-01-23 MED ORDER — ACETAMINOPHEN 650 MG RE SUPP: 650.0000 mg | Freq: Four times a day (QID) | RECTAL | Status: DC | PRN

## 2021-01-23 MED ORDER — ACETAMINOPHEN 325 MG PO TABS
650.0000 mg | ORAL_TABLET | Freq: Four times a day (QID) | ORAL | Status: DC | PRN
  Administered 2021-01-23: 650 mg via ORAL
  Filled 2021-01-23: qty 2

## 2021-01-23 MED ORDER — KETOROLAC TROMETHAMINE 15 MG/ML IJ SOLN
15.0000 mg | Freq: Once | INTRAMUSCULAR | Status: AC
  Administered 2021-01-23: 15 mg via INTRAVENOUS
  Filled 2021-01-23: qty 1

## 2021-01-23 NOTE — ED Notes (Signed)
Attempted to call report to [facility], spoke with Charge RN, will call back directly to [employer] soon to give report .

## 2021-01-23 NOTE — ED Triage Notes (Signed)
Pt comes via [facility] for n/v/d and dizziness and headache since this afternoon.

## 2021-01-23 NOTE — ED Provider Notes (Signed)
[facility] EMERGENCY DEPARTMENT Provider Note   CSN: [id number] Arrival date & time: 01/23/21  0045   History Chief Complaint  Patient presents with  . Dizziness    Cheyenne Valenzuela is a 65 y.o. female.  The history is provided by the patient.  Dizziness She has history of hypertension, hyperlipidemia and comes in because of severe dizziness with associated nausea and vomiting.  Dizziness started this evening with an abrupt onset.  She describes a spinning sensation which is worse with any movement.  There has been associated nausea and vomiting.  She has vomited about 6 times.  She came in by ambulance and received a dose of ondansetron which did give temporary relief of nausea, but nausea has recurred.  She also developed a headache after multiple episodes of emesis.  She denies fever or chills.  She denies any ear pain or hearing loss or tinnitus.  She has never had an episode like this before.  Past Medical History:  Diagnosis Date  . Hypercholesteremia   . Hypertension     There are no problems to display for this patient.   Past Surgical History:  Procedure Laterality Date  . ABDOMINAL HYSTERECTOMY    . APPENDECTOMY       OB History   No obstetric history on file.     No family history on file.  Social History   Tobacco Use  . Smoking status: Never Smoker  Substance Use Topics  . Alcohol use: No    Home Medications Prior to Admission medications   Medication Sig Start Date End Date Taking? Authorizing Provider  amLODipine-benazepril (LOTREL) 10-20 MG per capsule Take 1 capsule by mouth daily.    [provider]  atorvastatin (LIPITOR) 20 MG tablet Take 20 mg by mouth daily.    [provider]  hydrochlorothiazide (HYDRODIURIL) 12.5 MG tablet Take 12.5 mg by mouth daily.    [provider]  ranitidine (ZANTAC) 150 MG capsule Take 150 mg by mouth 2 (two) times daily.    [provider]    Allergies     Lisinopril  Review of Systems   Review of Systems  Neurological: Positive for dizziness.  All other systems reviewed and are negative.   Physical Exam Updated Vital Signs BP (!) 189/93 (BP Location: Left Arm)   Pulse 71   Temp 97.7 F (36.5 C) (Oral)   Resp 15   SpO2 100%   Physical Exam Vitals and nursing note reviewed.   65 year old female, resting comfortably and in no acute distress. Vital signs are significant for elevated blood pressure. Oxygen saturation is 100%, which is normal. Head is normocephalic and atraumatic. PERRLA, EOMI. Oropharynx is clear.  Horizontal nystagmus is noted on right lateral gaze. Neck is nontender and supple without adenopathy or JVD. Back is nontender and there is no CVA tenderness. Lungs are clear without rales, wheezes, or rhonchi. Chest is nontender. Heart has regular rate and rhythm without murmur. Abdomen is soft, flat, nontender without masses or hepatosplenomegaly and peristalsis is normoactive. Extremities have no cyanosis or edema, full range of motion is present. Skin is warm and dry without rash. Neurologic: Mental status is normal, cranial nerves are intact, there are no motor or sensory deficits.  Nystagmus present as noted above.  Dizziness is reproduced by rightward gaze.  Dizziness also reproduced by any passive head movement.  Finger-nose testing is normal bilaterally.  ED Results / Procedures / Treatments   Labs (all labs ordered  are listed, but only abnormal results are displayed) Labs Reviewed  CBC WITH DIFFERENTIAL/PLATELET - Abnormal; Notable for the following components:      Result Value   WBC 15.6 (*)    RDW 16.8 (*)    Neutro Abs 13.7 (*)    Abs Immature Granulocytes 0.09 (*)    All other components within normal limits  COMPREHENSIVE METABOLIC PANEL - Abnormal; Notable for the following components:   Potassium 3.3 (*)    Glucose, Bld 186 (*)    Total Protein 8.6 (*)    All other components within normal  limits  RESP PANEL BY RT-PCR (FLU A&B, COVID) ARPGX2  URINALYSIS, ROUTINE W REFLEX MICROSCOPIC  TROPONIN I (HIGH SENSITIVITY)  TROPONIN I (HIGH SENSITIVITY)    EKG EKG Interpretation  Date/Time:  Sunday January 23 2021 01:58:07 EDT Ventricular Rate:  81 PR Interval:  176 QRS Duration: 80 QT Interval:  416 QTC Calculation: 483 R Axis:   0 Text Interpretation: Normal sinus rhythm Moderate voltage criteria for LVH, may be normal variant ( R in aVL , Cornell product ) ST & T wave abnormality, consider inferolateral ischemia Prolonged QT Abnormal ECG No old tracing to compare Confirmed by Dione Booze ([id number]) on 01/23/2021 3:15:44 AM   Radiology CT Head Wo Contrast  Result Date: 01/23/2021 CLINICAL DATA:  Dizziness EXAM: CT HEAD WITHOUT CONTRAST TECHNIQUE: Contiguous axial images were obtained from the base of the skull through the vertex without intravenous contrast. COMPARISON:  None. FINDINGS: Brain: No evidence of acute infarction, hemorrhage, hydrocephalus, extra-axial collection, visible mass lesion or mass effect. Symmetric prominence of the ventricles, cisterns and sulci compatible with parenchymal volume loss. Patchy areas of white matter hypoattenuation are most compatible with chronic microvascular angiopathy. Vascular: No evidence of acute infarction, hemorrhage, hydrocephalus, extra-axial collection, visible mass lesion or mass effect. Skull: No calvarial fracture or suspicious osseous lesion. No scalp swelling or hematoma. Sinuses/Orbits: Paranasal sinuses and mastoid air cells are predominantly clear. Included orbital structures are unremarkable. Other: None IMPRESSION: 1. No acute intracranial findings. 2. 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 Electronically Signed   By: Kreg Shropshire M.D.   On: 01/23/2021 02:23    Procedures Procedures   Medications Ordered in ED Medications  droperidol (INAPSINE) 2.5 MG/ML injection 2.5 mg (has no administration in  time range)  lactated ringers bolus 1,000 mL (has no administration in time range)  potassium chloride SA (KLOR-CON) CR tablet 40 mEq (has no administration in time range)    ED Course  I have reviewed the triage vital signs and the nursing notes.  Pertinent labs & imaging results that were available during my care of the patient were reviewed by me and considered in my medical decision making (see chart for details).  MDM Rules/Calculators/A&P Peripheral vertigo.  No red flags to suggest more serious pathology.  CT of head was ordered at triage and shows no acute process.  Labs show mild hypokalemia, and she is given a dose of oral potassium.  Moderate leukocytosis is present and felt to represent a stress reaction.  ECG has borderline QT prolongation.  She will be given IV fluids, droperidol, oral K-Dur.  Old records are reviewed, and she has no relevant past visits.  She had partial relief of symptoms with above-noted treatment.  She continues to have headache and continues to have some vertigo but nausea has resolved.  Will give meclizine and acetaminophen, sent for MRI to rule out stroke.  Case is signed out to  Dr. Audley Hose.  Final Clinical Impression(s) / ED Diagnoses Final diagnoses:  Vertigo  Hypokalemia  Bad headache  Elevated blood pressure reading with diagnosis of hypertension    Rx / DC Orders ED Discharge Orders    None       Dione Booze, MD 01/23/21 [phone]

## 2021-01-23 NOTE — ED Notes (Signed)
Pt still in MRI 

## 2021-01-23 NOTE — H&P (Signed)
Date: 01/23/2021               Patient Name:  Cheyenne Valenzuela MRN: [id number]  DOB: 09-24-1956 Age / Sex: 65 y.o., female   PCP: Ananias Pilgrim, MD         Medical Service: Internal Medicine Teaching Service         Attending Physician: Dr. Mayford Knife, Dorene Ar, MD    First Contact: Dr. Cyndie Chime Pager: [phone]  Second Contact: Dr. Ephriam Knuckles Pager: [phone]       After Hours (After 5p/  First Contact Pager: [phone]  weekends / holidays): Second Contact Pager: [phone]   Chief Complaint: Vertigo  History of Present Illness:   Cheyenne Valenzuela is a 65 year old female with past medical history of hypertension, hyperlipidemia who presented to the ED for sudden onset of dizziness with associated nausea and vomiting.  She stated that the dizziness happened after dinner last night.  Spinning sensation per ED provider.  States that she vomited about 6 times since last night.  Also report associated headache with photophobia.  She states that the dizziness is constant overnight but worse with movement or lying on her right side, and better with rest or lying on her left side.  Never had vertigo before.  Denies history of migraine headache.  States that her vision was blurry during the vertigo episode but returned to normal at this time.  Denies any hearing loss.  Denies any recent illness, viral infection or Covid infection.  Denies neck stiffness, chest pain, shortness of breath abdominal pain, dysuria.  Patient states that all her symptoms have resolved.   Patient denies any chronic medical condition.  Patient is not taking medication at home.  Per chart review, looks that she has not seen a PCP since 2017.  States that her systolic blood pressure at home is usually in the 160s.  In the ED, CBC remarkable for leukocytosis of 15.6, likely reactive.  CMP showed mild hypokalemia of 3.3 with normal creatinine.  CT head shows no acute intracranial findings with chronic microvascular angiopathy.  Brain MRI  was negative for any infarct.  CTA was obtained because of patient's severe posterior headache, which showed no emergent finding.  There was intracranial atherosclerosis affecting the posterior circulation with severe left V4 narrowings.  Meds:  No outpatient medications have been marked as taking for the 01/23/21 encounter [facility] Encounter).     Allergies: Allergies as of 01/22/2021 - Review Complete 06/13/2014  Allergen Reaction Noted  . Lisinopril Swelling 06/13/2014   Past Medical History:  Diagnosis Date  . Hypercholesteremia   . Hypertension     Family History:  Sister has TIA  Social History:  Lives alone, dependent with ADLs Denies alcohol, cigarettes or drug use  Review of Systems: A complete ROS was negative except as per HPI.   Physical Exam: Blood pressure (!) 181/93, pulse (!) 101, temperature 97.7 F (36.5 C), temperature source Oral, resp. rate 18, SpO2 97 %. Physical Exam Constitutional:      General: She is not in acute distress. HENT:     Head: Normocephalic.  Eyes:     General:        Right eye: No discharge.        Left eye: No discharge.     Conjunctiva/sclera: Conjunctivae normal.  Pulmonary:     Effort: Pulmonary effort is normal. No respiratory distress.     Breath sounds: Normal breath sounds.  Abdominal:     General: Bowel  sounds are normal. There is no distension.     Tenderness: There is no abdominal tenderness.  Musculoskeletal:     Right lower leg: No edema.     Left lower leg: No edema.  Skin:    General: Skin is warm.  Neurological:     Mental Status: She is alert.     Comments: Cranial nerves no deficit Right lateral nystagmus 5/5 strength of upper and lower extremities bilaterally Sensation intact When standing, patient tends to drift to her right side  Psychiatric:        Mood and Affect: Mood normal.     EKG: personally reviewed my interpretation is normal sinus with LVH, T wave inversion of II, III, avF, V4-6.  No  prior tracing to compare  CXR: personally reviewed my interpretation is unremarkable  Assessment & Plan by Problem: Principal Problem:   Vertigo Active Problems:   Hypertension   Hyperlipidemia   Migraine headache  Cheyenne Valenzuela is a 65 year old female with past medical history of hypertension, hyperlipidemia who presented to the ED for sudden onset of dizziness with associated nausea and vomiting, suspicious for BPPV and migraine headache.  Vertigo This sudden onset of vertigo, triggered by movement is consistent with BPPV.    Physical exam pointed towards the affected right side.  Her concomitant headache with photophobia can also suggest vestibular migraine.  Low suspicion for vestibular neuritis or Mnire disease.  Unremarkable CT and brain MRI rules out other central causes. -PT/OT with vestibular rehab -Meclizine and Zofran as needed for symptomatic management  Hypertension Per chart review, patient was taking losartan 50 mg with HCTZ 12.5 mg in 2017.  She has not been taking any medications recently.  Reports systolic blood pressure at home 160s.  Will resume her blood pressure medication slowly to avoid dropping too quickly. -Resume losartan 50 mg daily  Intracranial atherosclerosis Posterior headache-resolved Hyperlipidemia Per ED provider, patient reports severe posterior headache.  CTA was negative for any aneurysm but showed intracranial atherosclerosis.  Will obtain lipid panel, hemoglobin A1c for secondary prevention. -Pending lipid panel and A1c -Lower blood pressure gradually  Leukocytosis Likely reactive.  CBC in a.m.  CODE: Full Diet: regular IVF: NA DVT: lovenox  Dispo: Admit patient to Observation with expected length of stay less than 2 midnights.  Signed: Doran Stabler, DO 01/23/2021, 2:33 PM  Pager: [phone] After 5pm on weekdays and 1pm on weekends: On Call pager: [phone]

## 2021-01-23 NOTE — ED Provider Notes (Signed)
Patient signed out to me is pending repeat evaluation however she remains unsteady in her gait.  MRI of the brain appears unremarkable for acute findings.  Patient is complaining to me of a severe headache in the posterior region.  States that she does not typically get headaches like these.  CT angio of the brain pursued with no evidence of aneurysms.  Blood pressure still remains high at 552-589 systolic.  Given Norvasc with no improvement.  Given hydralazine as well.  Given the myriad of her symptoms, will be brought into the hospitalist team for further evaluation.    Luna Fuse, MD 01/23/21 [phone]

## 2021-01-23 NOTE — ED Notes (Signed)
Pt to MRI at this time.

## 2021-01-23 NOTE — ED Triage Notes (Signed)
Emergency Medicine Provider Triage Evaluation Note  Marney Treloar , a 65 y.o. female  was evaluated in triage.  Pt complains of dizziness.  The patient endorses room spinning dizziness and lightheadedness, onset today accompanied by posterior headache, nausea, vomiting, diarrhea, and shortness of breath.  No fever chills, chest pain, abdominal pain, diaphoresis, numbness, visual changes, or weakness.  Patient has had difficulty walking due to feeling off balance since symptoms began more than 12 hours ago.  Review of Systems  Positive: Dizziness, lightheadedness, headache, nausea, vomiting, shortness of breath Negative: Abdominal pain, chest pain Karenz fever, chills, diaphoresis, numbness, visual changes, weakness  Physical Exam  BP (!) 189/93 (BP Location: Left Arm)   Pulse 71   Temp 97.7 F (36.5 C) (Oral)   Resp 15   SpO2 100%  Gen:   Awake, no distress, ill-appearing HEENT:  Atraumatic  Resp:  Normal effort  Cardiac:  Normal rate  Abd:   Nondistended, nontender  MSK:   Able to stand with assistance.  Unsteady gait. Neuro:  Speech clear   Medical Decision Making  Medically screening exam initiated at 1:52 AM.  Appropriate orders placed.  Guillermo Difrancesco was informed that the remainder of the evaluation will be completed by another provider, this initial triage assessment does not replace that evaluation, and the importance of remaining in the ED until their evaluation is complete.  Clinical Impression  65 year old female presenting with dizziness, headache, nausea, vomiting, diarrhea, and shortness of breath since this afternoon.  There is concern for a posterior stroke.  She is hypertensive and will order head CT to evaluate for hypertensive hemorrhage.  Labs have also been ordered.  She will require close evaluation for further work-up in the emergency department.   Frederik Pear A, PA-C 01/23/21 [phone]

## 2021-01-23 NOTE — ED Notes (Signed)
Patient transported to CT 

## 2021-01-23 NOTE — ED Notes (Signed)
Attempted 2 more times to call report to 5N RN.

## 2021-01-24 ENCOUNTER — Encounter (HOSPITAL_COMMUNITY): Payer: Self-pay | Admitting: Internal Medicine

## 2021-01-24 DIAGNOSIS — R42 Dizziness and giddiness: Secondary | ICD-10-CM

## 2021-01-24 DIAGNOSIS — E785 Hyperlipidemia, unspecified: Secondary | ICD-10-CM

## 2021-01-24 DIAGNOSIS — I10 Essential (primary) hypertension: Secondary | ICD-10-CM

## 2021-01-24 DIAGNOSIS — R519 Headache, unspecified: Secondary | ICD-10-CM

## 2021-01-24 LAB — BASIC METABOLIC PANEL
Anion gap: 8 (ref 5–15)
BUN: 9 mg/dL (ref 8–23)
CO2: 27 mmol/L (ref 22–32)
Calcium: 9.2 mg/dL (ref 8.9–10.3)
Chloride: 106 mmol/L (ref 98–111)
Creatinine, Ser: 0.95 mg/dL (ref 0.44–1.00)
GFR, Estimated: >60 mL/min (ref >60)
Glucose, Bld: 108 mg/dL — ABNORMAL HIGH (ref 70–99)
Potassium: 3.7 mmol/L (ref 3.5–5.1)
Sodium: 141 mmol/L (ref 135–145)

## 2021-01-24 LAB — LIPID PANEL
Cholesterol: 394 mg/dL — ABNORMAL HIGH (ref 0–200)
HDL: 33 mg/dL — ABNORMAL LOW (ref >40)
LDL Cholesterol: 331 mg/dL — ABNORMAL HIGH (ref 0–99)
Total CHOL/HDL Ratio: 11.9 RATIO
Triglycerides: 150 mg/dL — ABNORMAL HIGH (ref <150)
VLDL: 30 mg/dL (ref 0–40)

## 2021-01-24 LAB — HIV ANTIBODY (ROUTINE TESTING W REFLEX): HIV Screen 4th Generation wRfx: NONREACTIVE

## 2021-01-24 LAB — CBC
HCT: 36.7 % (ref 36.0–46.0)
Hemoglobin: 11.8 g/dL — ABNORMAL LOW (ref 12.0–15.0)
MCH: 27.9 pg (ref 26.0–34.0)
MCHC: 32.2 g/dL (ref 30.0–36.0)
MCV: 86.8 fL (ref 80.0–100.0)
Platelets: 436 10*3/uL — ABNORMAL HIGH (ref 150–400)
RBC: 4.23 MIL/uL (ref 3.87–5.11)
RDW: 17 % — ABNORMAL HIGH (ref 11.5–15.5)
WBC: 9.9 10*3/uL (ref 4.0–10.5)
nRBC: 0 % (ref 0.0–0.2)

## 2021-01-24 LAB — HEMOGLOBIN A1C
Hgb A1c MFr Bld: 6.7 % — ABNORMAL HIGH (ref 4.8–5.6)
Mean Plasma Glucose: 146 mg/dL

## 2021-01-24 MED ORDER — ASPIRIN EC 81 MG PO TBEC: 81.0000 mg | DELAYED_RELEASE_TABLET | Freq: Every day | ORAL | 0 refills | Status: AC

## 2021-01-24 MED ORDER — SUMATRIPTAN SUCCINATE 50 MG PO TABS: 50.0000 mg | ORAL_TABLET | Freq: Once | ORAL | 0 refills | Status: DC | PRN

## 2021-01-24 MED ORDER — PREDNISONE 10 MG PO TABS: ORAL_TABLET | ORAL | 0 refills | Status: AC

## 2021-01-24 MED ORDER — LOSARTAN POTASSIUM 50 MG PO TABS: 50.0000 mg | ORAL_TABLET | Freq: Every day | ORAL | 0 refills | Status: DC

## 2021-01-24 MED ORDER — ATORVASTATIN CALCIUM 80 MG PO TABS: 80.0000 mg | ORAL_TABLET | Freq: Every day | ORAL | 0 refills | Status: DC

## 2021-01-24 NOTE — Progress Notes (Signed)
Patient discharging home. Instructions given, all concerns addressed. Waiting on case manager to get help on prescription medications.

## 2021-01-24 NOTE — Discharge Instructions (Signed)
Benign Positional Vertigo Vertigo is the feeling that you or your surroundings are moving when they are not. Benign positional vertigo is the most common form of vertigo. This is usually a harmless condition (benign). This condition is positional. This means that symptoms are triggered by certain movements and positions. This condition can be dangerous if it occurs while you are doing something that could cause harm to you or others. This includes activities such as driving or operating machinery. What are the causes? The inner ear has fluid-filled canals that help your brain sense movement and balance. When the fluid moves, the brain receives messages about your body's position. With benign positional vertigo, crystals in the inner ear break free and disturb the inner ear area. This causes your brain to receive confusing messages about your body's position. What increases the risk? You are more likely to develop this condition if:  You are a woman.  You are 50 years of age or older.  You have recently had a head injury.  You have an inner ear disease. What are the signs or symptoms? Symptoms of this condition usually happen when you move your head or your eyes in different directions. Symptoms may start suddenly, and usually last for less than a minute. They include:  Loss of balance and falling.  Feeling like you are spinning or moving.  Feeling like your surroundings are spinning or moving.  Nausea and vomiting.  Blurred vision.  Dizziness.  Involuntary eye movement (nystagmus). Symptoms can be mild and cause only minor problems, or they can be severe and interfere with daily life. Episodes of benign positional vertigo may return (recur) over time. Symptoms may improve over time. How is this diagnosed? This condition may be diagnosed based on:  Your medical history.  Physical exam of the head, neck, and ears.  Positional tests to check for or stimulate vertigo. You may be  asked to turn your head and change positions, such as going from sitting to lying down. A health care provider will watch for symptoms of vertigo. You may be referred to a health care provider who specializes in ear, nose, and throat problems (ENT, or otolaryngologist) or a provider who specializes in disorders of the nervous system (neurologist). How is this treated? This condition may be treated in a session in which your health care provider moves your head in specific positions to help the displaced crystals in your inner ear move. Treatment for this condition may take several sessions. Surgery may be needed in severe cases, but this is rare. In some cases, benign positional vertigo may resolve on its own in 2-4 weeks.   Follow these instructions at home: Safety  Move slowly. Avoid sudden body or head movements or certain positions, as told by your health care provider.  Avoid driving until your health care provider says it is safe for you to do so.  Avoid operating heavy machinery until your health care provider says it is safe for you to do so.  Avoid doing any tasks that would be dangerous to you or others if vertigo occurs.  If you have trouble walking or keeping your balance, try using a cane for stability. If you feel dizzy or unstable, sit down right away.  Return to your normal activities as told by your health care provider. Ask your health care provider what activities are safe for you. General instructions  Take over-the-counter and prescription medicines only as told by your health care provider.  Drink enough fluid   to keep your urine pale yellow.  Keep all follow-up visits as told by your health care provider. This is important. Contact a health care provider if:  You have a fever.  Your condition gets worse or you develop new symptoms.  Your family or friends notice any behavioral changes.  You have nausea or vomiting that gets worse.  You have numbness or a  prickling and tingling sensation. Get help right away if you:  Have difficulty speaking or moving.  Are always dizzy.  Faint.  Develop severe headaches.  Have weakness in your legs or arms.  Have changes in your hearing or vision.  Develop a stiff neck.  Develop sensitivity to light. Summary  Vertigo is the feeling that you or your surroundings are moving when they are not. Benign positional vertigo is the most common form of vertigo.  This condition is caused by crystals in the inner ear that become displaced. This causes a disturbance in an area of the inner ear that helps your brain sense movement and balance.  Symptoms include loss of balance and falling, feeling that you or your surroundings are moving, nausea and vomiting, and blurred vision.  This condition can be diagnosed based on symptoms, a physical exam, and positional tests.  Follow safety instructions as told by your health care provider. You will also be told when to contact your health care provider in case of problems. This information is not intended to replace advice given to you by your health care provider. Make sure you discuss any questions you have with your health care provider. Document Revised: 08/19/2019 Document Reviewed: 03/06/2018 Elsevier Patient Education  2021 [employer] Inc[employer]  

## 2021-01-24 NOTE — Evaluation (Signed)
Physical Therapy Evaluation/Vestibular Evaluation  Patient Details Name: Cheyenne Valenzuela MRN: [id number] DOB: Jun 25, 1956 Today's Date: 01/24/2021   History of Present Illness  65 y.o. female admitted as observation on 01/23/21 for sudden onset dizziness, N/V, HA.  MRI and CT negative.  CTA reveals intracranial atherosclerosis affecting the posterior circulation with severe left V4 narrowings.  Pt with significant PMH of HTN.  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  See testing below.  All of her canal/BPPV testing was negative.  HEP x1 gaze stability exercises given and reviewed.  She is significantly better than yesterday and if she does her HEP she should not need therapy follow up at discharge.  I screened for OT needs, none needed.  PT will continue to follow acutely until d/c confirmed.      Follow Up Recommendations No PT follow up    Equipment Recommendations  None recommended by PT    Recommendations for Other Services       Precautions / Restrictions Precautions Precautions: None      Mobility  Bed Mobility Overal bed mobility: Independent                  Transfers Overall transfer level: Independent                  Ambulation/Gait Ambulation/Gait assistance: Modified independent (Device/Increase time) Gait Distance (Feet): 200 Feet Assistive device: None Gait Pattern/deviations: Step-through pattern;Staggering left;Staggering right Gait velocity: decreased Gait velocity interpretation: >2.62 ft/sec, indicative of community ambulatory General Gait Details: pt with mildly staggering gait pattern, slow turns, self corrects without assist.  Stairs            Wheelchair Mobility    Modified Rankin (Stroke Patients Only)       Balance Overall balance assessment: Needs assistance                           High level balance activites: Other (comment);Head turns (picking up  object from floor) High Level Balance Comments: supervision for safety       01/24/21 0001  Vestibular Assessment  General Observation head tipped to the R  Symptom Behavior  Subjective history of current problem sudden onset spinning, no URI, no head trauma, blurry, but no dobule vision, no recent medication change, no recent whiplash, does not wear glasses (readers on her table), some intermittent tinnitus, no fullness, no hearing loss,  Type of Dizziness  Spinning  Frequency of Dizziness near constant  Duration of Dizziness long  Symptom Nature Motion provoked;Positional  Aggravating Factors Turning head sideways;Turning head quickly;Rolling to right  Relieving Factors Not applicable  Progression of Symptoms Better  History of similar episodes none  Oculomotor Exam  Oculomotor Alignment Normal  Gaze-induced  Right beating nystagmus with R gaze;Right beating nystagmus with L gaze (follows alexander's law)  Smooth Pursuits Saccades (however, may be nystagmus intrusions)  Saccades Intact  Oculomotor Exam-Fixation Suppressed   Left Head Impulse pos  Right Head Impulse neg  Vestibulo-Ocular Reflex  VOR 1 Head Only (x 1 viewing) symptomatic with horizontal, slow speed  VOR Cancellation Normal  Auditory  Comments grossly equal  Positional Testing  Dix-Hallpike Dix-Hallpike Right;Dix-Hallpike Left  Horizontal Canal Testing Horizontal Canal Right;Horizontal Canal Left  Dix-Hallpike Right  Dix-Hallpike Right Duration 0  Dix-Hallpike Right Symptoms No nystagmus  Dix-Hallpike Left  Dix-Hallpike Left Duration 0  Dix-Hallpike Left Symptoms No nystagmus  Horizontal Canal  Right  Horizontal Canal Right Duration 0  Horizontal Canal Right Symptoms Normal  Horizontal Canal Left  Horizontal Canal Left Duration 0  Horizontal Canal Left Symptoms Normal           Pertinent Vitals/Pain Pain Assessment: No/denies pain    Home Living Family/patient expects to be discharged to::  Private residence Living Arrangements: Alone Available Help at Discharge: Family;Available PRN/intermittently Type of Home: House Home Access: Stairs to enter Entrance Stairs-Rails: Right Entrance Stairs-Number of Steps: 5 Home Layout: One level        Prior Function Level of Independence: Independent         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     Higher education careers adviser   Dominant Hand: Right    Extremity/Trunk Assessment   Upper Extremity Assessment Upper Extremity Assessment: Overall WFL for tasks assessed    Lower Extremity Assessment Lower Extremity Assessment: Overall WFL for tasks assessed    Cervical / Trunk Assessment Cervical / Trunk Assessment: Normal  Communication   Communication: No difficulties  Cognition Arousal/Alertness: Awake/alert Behavior During Therapy: WFL for tasks assessed/performed Overall Cognitive Status: Within Functional Limits for tasks assessed                                        General Comments      Exercises Other Exercises Other Exercises: x1 seated horizontal head shaking gaze stability exercises.   Assessment/Plan    PT Assessment Patient needs continued PT services  PT Problem List Decreased balance       PT Treatment Interventions DME instruction;Gait training;Stair training;Functional mobility training;Therapeutic activities;Therapeutic exercise;Balance training;Patient/family education    PT Goals (Current goals can be found in the Care Plan section)  Acute Rehab PT Goals Patient Stated Goal: to get better, return to work PT Goal Formulation: With patient/family Time For Goal Achievement: 02/07/21 Potential to Achieve Goals: Good    Frequency Min 3X/week   Barriers to discharge        Co-evaluation               AM-PAC PT "6 Clicks" Mobility  Outcome Measure Help needed turning from your back to your side while in a flat bed without using bedrails?: None Help needed  moving from lying on your back to sitting on the side of a flat bed without using bedrails?: None Help needed moving to and from a bed to a chair (including a wheelchair)?: None Help needed standing up from a chair using your arms (e.g., wheelchair or bedside chair)?: None Help needed to walk in hospital room?: A Little Help needed climbing 3-5 steps with a railing? : A Little 6 Click Score: 22    End of Session   Activity Tolerance: Patient tolerated treatment well Patient left: in bed;with call bell/phone within reach;with family/visitor present   PT Visit Diagnosis: Dizziness and giddiness (R42);Unsteadiness on feet (R26.81)    Time: 3710-6269 (significant time spent on assessment) PT Time Calculation (min) (ACUTE ONLY): 45 min   Charges:   PT Evaluation $PT Eval Low Complexity: 1 Low PT Treatments $Gait Training: 8-22 mins       Corinna Capra, PT, DPT  Acute Rehabilitation [phone] pager [phone]) [phone] office

## 2021-01-24 NOTE — Progress Notes (Signed)
OT Cancellation Note  Patient Details Name: Cheyenne Valenzuela MRN: [id number] DOB: 11/14/1955   Cancelled Treatment:    Reason Eval/Treat Not Completed: OT screened, no needs identified, will sign off. Per Lurena Joiner, PT and prior notes pt's symptoms are resolving. Pt is ambulating indep around room and completing all ADLs indep. No OT needs at this time.   Rafal Archuleta A Arlean Thies 01/24/2021, 11:10 AM

## 2021-01-24 NOTE — Discharge Summary (Signed)
Name: Cheyenne Valenzuela MRN: [id number] DOB: [birth year] 65 y.o. PCP: Ananias Pilgrim, MD  Date of Admission: 01/23/2021  1:36 AM Date of Discharge: 01/24/2021 Attending Physician: Dr. Criselda Peaches  Discharge Diagnosis: Principal Problem:   Vertigo Active Problems:   Hypertension   Hyperlipidemia   Migraine headache    Discharge Medications: Allergies as of 01/24/2021      Reactions   Lisinopril Swelling      Medication List    TAKE these medications   aspirin EC 81 MG tablet Take 1 tablet (81 mg total) by mouth daily. Swallow whole.   atorvastatin 80 MG tablet Commonly known as: Lipitor Take 1 tablet (80 mg total) by mouth daily.   losartan 50 MG tablet Commonly known as: COZAAR Take 1 tablet (50 mg total) by mouth daily.   predniSONE 10 MG tablet Commonly known as: DELTASONE Take 6 tablets (60 mg total) by mouth daily with breakfast for 5 days, THEN 4 tablets (40 mg total) daily with breakfast for 2 days, THEN 3 tablets (30 mg total) daily with breakfast for 1 day, THEN 2 tablets (20 mg total) daily with breakfast for 1 day, THEN 1 tablet (10 mg total) daily with breakfast for 1 day, THEN 0.5 tablets (5 mg total) daily with breakfast for 1 day. Start taking on: January 24, 2021   SUMAtriptan 50 MG tablet Commonly known as: Imitrex Take 1 tablet (50 mg total) by mouth once as needed for migraine. May repeat in 2 hours if headache persists or recurs.       Disposition and follow-up:   CheyenneEternity Valenzuela was discharged from [facility] in Good condition.  At the hospital follow up visit please address:  1.  Follow-up:  A. Needs OP follow up with neurology     B. Check BP, restarted on Losartan 50 mg will likely need increase in BP medication     2.  Labs / imaging needed at time of follow-up: CBC   3.  Pending labs/ test needing follow-up: Hgb A1C   4.  Medication Changes  Started: Asprin 81 mg, Atorvastatin 80 mg, Prednisone taper, sumatriptan 50  mg  Stopped: n/a   Changed: n/a    Follow-up Appointments:  Follow-up Information    [facility] Follow up in 1 week(s).   Why: The front desk will call you for appointment Contact information: [address] [place] [address] [phone]              Hospital Course by problem list: Vertigo Posterior HA Patient with acute onset of vertigo started Saturday evening with associated nausea and vomiting. Made worse with movement, though she describes it as constant. First episode of vertigo. With the vertigo she also developed severe posterior HA with photophobia, consistent with migraine. No past history of migraine.  On physical exam she demonstrated right lateral nystagmus and when standing she would drift towards her right, no other focal neurologic deficits were apprecitated. She denied and tinnitus or hearing changes. Patient found to have elevated WBC of 15.6, likely reactive and mild hypokalemia of 3.3 with normal creatine. CT head showed no acute intracranial findings, MRI was negative for infarct though CTA was obtained due to patients concomitant severe posterior HA which was significant for intracranial atherosclerosis affecting the posterior circulation with with severe left V4 narrowing. Symptoms were managed with Meclizine and Zofran and patient reported resolution of vertigo and HA this morning. PT/OT evaluated the patient and did  not recommend continued therapy. 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 Patient sent home with Prednisone taper for potential vestibular neuronitis and sumatriptan as abortive if migraine reoccurs. Will require OP neurology follow-up.   Hypertension Per chart review, patient was taking losartan 50 mg with HCTZ 12.5 mg in 2017.  She has not been taking any medications recently. BP on arrival was 183/93 and remained high (160s-180s systolic)  through stay. Plan to  resume her blood pressure medication slowly to avoid dropping too quickly. She will go home on losartan 50 mg daily and will likely need to restart HCTZ at OP follow-up.   Intracranial atherosclerosis Hyperlipidemia As per above CTA head significant for intracranial atherosclerosis affecting the posterior circulation with with severe left V4 narrowing. Lipid panel was concerning for total cholesterol of 394, triglycerides of 150, HDL 33 and LDL 331. A1C was checked but had not resulted at time of discharge. ASCVD calculated between 22-31% thus Atorvastatin 80 mg and Asprin 81 mg were started at time of discharged.    Discharge Subjective: Patient reports she feel much better today. She is up and walking around and tolerating diet well. She denies dizziness and HA. She was informed of new medications and importance of close OP follow-up and expresses she understands.   Discharge Exam:   BP (!) 185/97 (BP Location: Right Arm)   Pulse 79   Temp 97.9 F (36.6 C) (Oral)   Resp 17   SpO2 98%  Constitutional: well-appearing person sitting in bed, in no acute distress HENT: normocephalic atraumatic, mucous membranes moist Eyes: conjunctiva non-erythematous Cardiovascular: regular rate and rhythm, no m/r/g Pulmonary/Chest: normal work of breathing on room air, lungs clear to auscultation bilaterally Abdominal: soft, non-tender, non-distended MSK: normal bulk and tone Neurological: alert & oriented x 3, Cranial nerved II-XII grossly intact, 4+/5 strength in bilateral upper and lower extremities, Gilberto Better was negative bilateral, negative pronator drift, negative Romberg  Skin: warm and dry  Pertinent Labs, Studies, and Procedures:  CBC Latest Ref Rng & Units 01/24/2021 01/23/2021  WBC 4.0 - 10.5 K/uL 9.9 15.6(H)  Hemoglobin 12.0 - 15.0 g/dL 11.8(L) 12.4  Hematocrit 36.0 - 46.0 % 36.7 38.7  Platelets 150 - 400 K/uL 436(H) 389    CMP Latest Ref Rng & Units 01/24/2021  01/23/2021  Glucose 70 - 99 mg/dL 161(W) 960(A)  BUN 8 - 23 mg/dL 9 11  Creatinine 5.40 - 1.00 mg/dL 9.81 1.91  Sodium 478 - 145 mmol/L 141 138  Potassium 3.5 - 5.1 mmol/L 3.7 3.3(L)  Chloride 98 - 111 mmol/L 106 102  CO2 22 - 32 mmol/L 27 25  Calcium 8.9 - 10.3 mg/dL 9.2 9.5  Total Protein 6.5 - 8.1 g/dL - 8.6(H)  Total Bilirubin 0.3 - 1.2 mg/dL - 0.5  Alkaline Phos 38 - 126 U/L - 77  AST 15 - 41 U/L - 23  ALT 0 - 44 U/L - 26    CT Angio Head W or Wo Contrast  Result Date: 01/23/2021 CLINICAL DATA:  Dizziness and headache since this afternoon EXAM: CT ANGIOGRAPHY HEAD TECHNIQUE: Multidetector CT imaging of the head was performed using the standard protocol during bolus administration of intravenous contrast. Multiplanar CT image reconstructions and MIPs were obtained to evaluate the vascular anatomy. CONTRAST:  68mL OMNIPAQUE IOHEXOL 350 MG/ML SOLN COMPARISON:  Brain MRI from earlier today FINDINGS: CT HEAD Head CT was performed earlier the same day. CTA HEAD Anterior circulation: Atheromatous  irregularity affecting bilateral carotid siphons with 50% right paraclinoid segment stenosis. No branch occlusion or flow limiting stenosis, generalized vessel beading, or aneurysm seen. Posterior circulation: Codominant vertebral arteries. Atheromatous irregularity of bilateral distal V4 segments with advanced narrowing on the left. Moderate distal basilar stenosis. Fetal type left PCA. No branch occlusion or proximal flow limiting stenosis. Venous sinuses: Unremarkable Anatomic variants: As above Review of the MIP images confirms the above findings. IMPRESSION: 1. No emergent finding. 2. Intracranial atherosclerosis most heavily affecting the posterior circulation with severe left V4 segment and moderate distal basilar narrowings. Electronically Signed   By: Marnee SpringJonathon  Watts M.D.   On: 01/23/2021 11:09   DG Chest 2 View  Result Date: 01/23/2021 CLINICAL DATA:  Shortness of breath, dizziness EXAM: CHEST -  2 VIEW COMPARISON:  None. FINDINGS: Prominent cardiac silhouette. Some mild pulmonary vascular congestion with indistinct vascularity as well as perihilar hazy opacity fissural thickening. No pneumothorax or layering pleural effusion. No acute osseous or soft tissue abnormality. Telemetry leads overlie the chest. IMPRESSION: Prominent cardiac silhouette, possibly accentuated by portable technique though could reflect cardiomegaly particularly given the presence of some pulmonary vascular congestion and hazy perihilar predominant opacity which could reflect early developing. Appearance suggestive of CHF/volume. Correlate with clinical symptoms. Electronically Signed   By: Kreg ShropshirePrice  DeHay M.D.   On: 01/23/2021 04:18   CT Head Wo Contrast  Result Date: 01/23/2021 CLINICAL DATA:  Dizziness EXAM: CT HEAD WITHOUT CONTRAST TECHNIQUE: Contiguous axial images were obtained from the base of the skull through the vertex without intravenous contrast. COMPARISON:  None. FINDINGS: Brain: No evidence of acute infarction, hemorrhage, hydrocephalus, extra-axial collection, visible mass lesion or mass effect. Symmetric prominence of the ventricles, cisterns and sulci compatible with parenchymal volume loss. Patchy areas of white matter hypoattenuation are most compatible with chronic microvascular angiopathy. Vascular: No evidence of acute infarction, hemorrhage, hydrocephalus, extra-axial collection, visible mass lesion or mass effect. Skull: No calvarial fracture or suspicious osseous lesion. No scalp swelling or hematoma. Sinuses/Orbits: Paranasal sinuses and mastoid air cells are predominantly clear. Included orbital structures are unremarkable. Other: None IMPRESSION: 1. No acute intracranial findings. 2. 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 Electronically Signed   By: Kreg ShropshirePrice  DeHay M.D.   On: 01/23/2021 02:23   MR BRAIN WO CONTRAST  Result Date: 01/23/2021 CLINICAL DATA:  Central vertigo EXAM:  MRI HEAD WITHOUT CONTRAST TECHNIQUE: Multiplanar, multiecho pulse sequences of the brain and surrounding structures were obtained without intravenous contrast. COMPARISON:  Head CT from earlier today FINDINGS: Brain: No acute infarction, hemorrhage, hydrocephalus, extra-axial collection or mass lesion. Mild chronic small vessel ischemic type change in the cerebral white matter. Normal brain volume. Vascular: Preserved flow voids, including vertebrobasilar. Skull and upper cervical spine: Normal marrow signal Sinuses/Orbits: No acute finding IMPRESSION: Negative for infarct or other acute finding. Electronically Signed   By: Marnee SpringJonathon  Watts M.D.   On: 01/23/2021 08:21     Discharge Instructions: Discharge Instructions    Call MD for:  persistant dizziness or light-headedness   Complete by: As directed    Call MD for:  persistant nausea and vomiting   Complete by: As directed    Diet - low sodium heart healthy   Complete by: As directed    Discharge instructions   Complete by: As directed    Dear Ms. Andrey CampanileWilson,  It was a pleasure getting to take care of you during your stay at the hospital. You were here because of a severe headache and symptoms  of vertigo. We believe your symptoms may have been caused by either vestibular migraine (a type of migraine headache that also causes vertigo), vestibular neuritis (inflammation of the inner ear), or a transient ischemic attack (when a part of your brain doesn't get enough blood, but the symptoms are temporary). While you were here we found your blood pressure to be very high, your cholesterol to be very high and there to be some narrowing of the blood vessels in the back part of your brain.   We are starting you on some new medications: 1. For your blood pressure you will continue taking losartan (Cozaar) 50 mg, one time per day. They might add more blood pressure medicine at the follow-up appointment.  2. Take Asprin 81 mg, one time per day, to help prevent  blood clots in the narrowed vessels. 3. Take Lipitor 80 mg, one time a day to help lower your cholesterol.  4. If you have another migraine headache, take sumatriptan (Imitrex) 50 mg. You ONLY TAKE THIS IF YOU HAVE A SEVERE HEADACHE. You do not need to take it everyday.   5. You will also have a Prednisone (steroid) taper. Follow the instructions on the package. This is to stop the inflammation in the ear that may have been causing the dizziness.   You will have a follow-up in our clinic in about one week. The clinic will call you to set up that appointment. Please let us know if you have any questions or concerns, [phone]. We all hope you continue to feel well!   Increase activity slowly   Complete by: As directed       Signed: Lucretia Roers, Medical Student 01/24/2021, 3:30 PM   Pager: [phone]

## 2021-02-02 ENCOUNTER — Encounter: Payer: Self-pay | Admitting: Internal Medicine

## 2021-02-02 ENCOUNTER — Other Ambulatory Visit (HOSPITAL_COMMUNITY): Payer: Self-pay

## 2021-02-02 ENCOUNTER — Ambulatory Visit (INDEPENDENT_AMBULATORY_CARE_PROVIDER_SITE_OTHER): Payer: Self-pay | Admitting: Student

## 2021-02-02 ENCOUNTER — Encounter: Payer: Self-pay | Admitting: Student

## 2021-02-02 VITALS — BP 185/104 | HR 96 | Temp 98.3°F | Wt 179.3 lb

## 2021-02-02 DIAGNOSIS — R739 Hyperglycemia, unspecified: Secondary | ICD-10-CM

## 2021-02-02 DIAGNOSIS — Z0000 Encounter for general adult medical examination without abnormal findings: Secondary | ICD-10-CM

## 2021-02-02 DIAGNOSIS — R42 Dizziness and giddiness: Secondary | ICD-10-CM

## 2021-02-02 DIAGNOSIS — I672 Cerebral atherosclerosis: Secondary | ICD-10-CM

## 2021-02-02 DIAGNOSIS — E119 Type 2 diabetes mellitus without complications: Secondary | ICD-10-CM

## 2021-02-02 DIAGNOSIS — E785 Hyperlipidemia, unspecified: Secondary | ICD-10-CM

## 2021-02-02 DIAGNOSIS — I10 Essential (primary) hypertension: Secondary | ICD-10-CM

## 2021-02-02 MED ORDER — LOSARTAN POTASSIUM 50 MG PO TABS: 50.0000 mg | ORAL_TABLET | Freq: Every day | ORAL | 3 refills | Status: DC

## 2021-02-02 MED ORDER — LOSARTAN POTASSIUM 50 MG PO TABS
50.0000 mg | ORAL_TABLET | Freq: Every day | ORAL | 3 refills | Status: DC
  Filled 2021-02-02: qty 30, 30d supply, fill #0

## 2021-02-02 MED ORDER — HYDROCHLOROTHIAZIDE 12.5 MG PO TABS: 12.5000 mg | ORAL_TABLET | Freq: Every day | ORAL | 3 refills | Status: DC

## 2021-02-02 MED ORDER — ATORVASTATIN CALCIUM 80 MG PO TABS: 80.0000 mg | ORAL_TABLET | Freq: Every day | ORAL | 3 refills | Status: DC

## 2021-02-02 MED ORDER — ATORVASTATIN CALCIUM 80 MG PO TABS
80.0000 mg | ORAL_TABLET | Freq: Every day | ORAL | 3 refills | Status: DC
  Filled 2021-02-02: qty 30, 30d supply, fill #0
  Filled 2021-03-29: qty 30, 30d supply, fill #1
  Filled 2021-11-22: qty 30, 30d supply, fill #2

## 2021-02-02 MED ORDER — HYDROCHLOROTHIAZIDE 12.5 MG PO TABS
12.5000 mg | ORAL_TABLET | Freq: Every day | ORAL | 3 refills | Status: DC
  Filled 2021-02-02: qty 30, 30d supply, fill #0

## 2021-02-02 NOTE — Assessment & Plan Note (Addendum)
Elevated lipid panel during admission. ASCVD calculated between 22-31%. Continue on statin and repeat lipid panel at next OV.

## 2021-02-02 NOTE — Patient Instructions (Addendum)
It was a pleasure meeting you in clinic. Today we discussed:   Hospital follow up: I am so glad to hear your vertigo has improved. Please let us know if your symptoms come back. You will also need to follow up with neurology when you get the chance.  Hypertension: Please continue losartan 50 mg daily and start Hydrochlorothiazide. Please continue you statin and aspirin as well. Please bring you blood pressure meter to your next visit or a log of your readings.   Cough: Please try over the counter Flonase to see if that helps with the runny nose and cough  Elevated A1c: You A1c was elevated in the hospital we will recheck it today. If it remains elevated we will need to discuss diabetes at your next visit.  I will call with results of your labwork today and you can pick up your prescriptions at the [facility]  Please follow up in 2-3 weeks.  If you have any questions or concerns, please call our clinic at [phone] between 9am-5pm and after hours call [phone] and ask for the internal medicine resident on call. If you feel you are having a medical emergency please call 911.   Thank you, we look forward to helping you remain healthy!

## 2021-02-03 ENCOUNTER — Encounter: Payer: Self-pay | Admitting: Student

## 2021-02-03 LAB — BMP8+ANION GAP
Anion Gap: 12 mmol/L (ref 10.0–18.0)
BUN/Creatinine Ratio: 26 (ref 12–28)
BUN: 20 mg/dL (ref 8–27)
CO2: 29 mmol/L (ref 20–29)
Calcium: 9.5 mg/dL (ref 8.7–10.3)
Chloride: 101 mmol/L (ref 96–106)
Creatinine, Ser: 0.77 mg/dL (ref 0.57–1.00)
Glucose: 110 mg/dL — ABNORMAL HIGH (ref 65–99)
Potassium: 4.4 mmol/L (ref 3.5–5.2)
Sodium: 142 mmol/L (ref 134–144)
eGFR: 86 mL/min/{1.73_m2} (ref >59)

## 2021-02-03 LAB — HEMOGLOBIN A1C
Est. average glucose Bld gHb Est-mCnc: 166 mg/dL
Hgb A1c MFr Bld: 7.4 % — ABNORMAL HIGH (ref 4.8–5.6)

## 2021-02-04 ENCOUNTER — Encounter: Payer: Self-pay | Admitting: Student

## 2021-02-04 DIAGNOSIS — E1169 Type 2 diabetes mellitus with other specified complication: Secondary | ICD-10-CM | POA: Insufficient documentation

## 2021-02-04 DIAGNOSIS — Z0000 Encounter for general adult medical examination without abnormal findings: Secondary | ICD-10-CM | POA: Insufficient documentation

## 2021-02-04 DIAGNOSIS — E119 Type 2 diabetes mellitus without complications: Secondary | ICD-10-CM | POA: Insufficient documentation

## 2021-02-04 DIAGNOSIS — I672 Cerebral atherosclerosis: Secondary | ICD-10-CM | POA: Insufficient documentation

## 2021-02-04 NOTE — Assessment & Plan Note (Signed)
Admitted for vertigo, headache, N/v on 01/23/2021. No acute finding on CT head or MRI.  CTA significant for intracranial atherosclerosis affecting the posterior circulation with with severe left V4 narrowing. Treated with with Meclizine and Zofran. Symptoms attributed to vestibular migraine vs vestibular neuronitis, vs TIA. Treated with prednisone for possible vestibular neuronitis which patient finished about 4 days ago and sumatriptan for headache. She has been feeling well since discharge has not needed meclizine or sumatriptan and has been headache free. Has not set up follow up with neurology.   - continue ASA and statin - Follow up with neurology

## 2021-02-04 NOTE — Assessment & Plan Note (Signed)
Patient with history of hypertension previously on losartan and HCTZ in 2017 but has been off medications since her PCP retired in 2017. Found to be hypertensive during admission in the 160-180s. She reports home BP generally in 130-140s. Was start on losartan 50 mg daily during admission which she is tolerating well. BP Readings from Last 3 Encounters:  02/02/21 (!) 185/104  01/24/21 (!) 185/97  06/13/14 163/92   BP remains high in office today. She denies CP, HA, n/v, weakness, numbness, tingling, or blurred vision. BMP wnl today. Will add HCTZ 12.5 mg in addition to losartan. Follow up in 2-3 weeks

## 2021-02-04 NOTE — Assessment & Plan Note (Signed)
A1c during admission of 6.7. Recently completes 5 day prednisone taper. Will repeat A1c today.  A1c of 7.2 on repeat today. Will discuss diabetes treatment at follow up in 2 weeks.

## 2021-02-04 NOTE — Assessment & Plan Note (Signed)
Patient due to mammogram and colonoscopy would like to defer this until she qualifies for Medicare later this year.

## 2021-02-04 NOTE — Progress Notes (Signed)
New Patient Office Visit  Subjective:  Patient ID: Cheyenne Valenzuela, female    DOB: [birth year]  Age: 65 y.o. MRN: [id number]  CC:  Chief Complaint  Patient presents with  . Follow-up    Est. Care, vertigo     HPI Cheyenne Valenzuela iw a 65 year old female with history of hypertension and hyperlipidemia presents for hospital follow up of vertigo and to establish care. Please refer to problem based charting for further details and assessment and plan of current problem and chronic medical conditions.   Past Medical History:  Diagnosis Date  . Hypercholesteremia   . Hypertension     Past Surgical History:  Procedure Laterality Date  . ABDOMINAL HYSTERECTOMY    . APPENDECTOMY      Notes sister with diabetes and aunt with history of unknown cancer.  Social History   Socioeconomic History  . Marital status: Single    Spouse name: Not on file  . Number of children: Not on file  . Years of education: Not on file  . Highest education level: Not on file  Occupational History  . Occupation: Retired  Tobacco Use  . Smoking status: Never Smoker  . Smokeless tobacco: Never Used  Substance and Sexual Activity  . Alcohol use: No  . Drug use: Not on file  . Sexual activity: Not on file  Other Topics Concern  . Not on file  Social History Narrative  . Not on file   Social Determinants of Health   Financial Resource Strain: Not on file  Food Insecurity: Not on file  Transportation Needs: Not on file  Physical Activity: Not on file  Stress: Not on file  Social Connections: Not on file  Intimate Partner Violence: Not on file    ROS Review of Systems  Constitutional: Negative for activity change, chills and fever.  HENT: Negative.   Eyes: Negative for photophobia and visual disturbance.  Respiratory: Negative.   Cardiovascular: Negative for chest pain and leg swelling.  Gastrointestinal: Negative for abdominal pain, nausea and vomiting.  Endocrine: Negative.   Genitourinary:  Negative for dysuria and flank pain.  Musculoskeletal: Negative.   Skin: Negative.   Neurological: Negative for dizziness, syncope, speech difficulty, weakness, light-headedness, numbness and headaches.  Psychiatric/Behavioral: Negative.     Objective:   Today's Vitals: BP (!) 185/104 (BP Location: Left Arm, Patient Position: Sitting, Cuff Size: Small)   Pulse 96   Temp 98.3 F (36.8 C) (Oral)   Wt 179 lb 4.8 oz (81.3 kg)   SpO2 100%   BMI 30.78 kg/m   Physical Exam Constitutional:      Appearance: Normal appearance.  HENT:     Head: Normocephalic and atraumatic.     Right Ear: External ear normal.     Left Ear: External ear normal.     Mouth/Throat:     Mouth: Mucous membranes are moist.     Pharynx: Oropharynx is clear.  Eyes:     Pupils: Pupils are equal, round, and reactive to light.  Cardiovascular:     Rate and Rhythm: Normal rate and regular rhythm.     Pulses: Normal pulses.     Heart sounds: Normal heart sounds.  Pulmonary:     Effort: Pulmonary effort is normal.     Breath sounds: Normal breath sounds.  Abdominal:     General: Abdomen is flat.  Musculoskeletal:     Right lower leg: No edema.     Left lower leg: No edema.  Skin:  General: Skin is warm and dry.     Capillary Refill: Capillary refill takes less than 2 seconds.  Neurological:     General: No focal deficit present.     Mental Status: She is alert and oriented to person, place, and time. Mental status is at baseline.  Psychiatric:        Mood and Affect: Mood normal.        Behavior: Behavior normal.     Assessment & Plan:   Problem List Items Addressed This Visit      Cardiovascular and Mediastinum   Elevated blood pressure reading with diagnosis of hypertension - Primary   Relevant Medications   atorvastatin (LIPITOR) 80 MG tablet   hydrochlorothiazide (HYDRODIURIL) 12.5 MG tablet   losartan (COZAAR) 50 MG tablet   Other Relevant Orders   BMP8+Anion Gap (Completed)     Other    Hyperlipidemia    Continue on statin and repeat lipid panel at next OV.      Relevant Medications   atorvastatin (LIPITOR) 80 MG tablet   hydrochlorothiazide (HYDRODIURIL) 12.5 MG tablet   losartan (COZAAR) 50 MG tablet    Other Visit Diagnoses    Hyperglycemia       Relevant Orders   Hemoglobin A1c (Completed)      Outpatient Encounter Medications as of 02/02/2021  Medication Sig  . [DISCONTINUED] hydrochlorothiazide (HYDRODIURIL) 12.5 MG tablet Take 1 tablet (12.5 mg total) by mouth daily.  Marland Kitchen aspirin EC 81 MG tablet Take 1 tablet (81 mg total) by mouth daily. Swallow whole.  Marland Kitchen atorvastatin (LIPITOR) 80 MG tablet Take 1 tablet (80 mg total) by mouth daily.  . hydrochlorothiazide (HYDRODIURIL) 12.5 MG tablet Take 1 tablet (12.5 mg total) by mouth daily.  Marland Kitchen losartan (COZAAR) 50 MG tablet Take 1 tablet (50 mg total) by mouth daily.  . predniSONE (DELTASONE) 10 MG tablet Take 6 tablets (60 mg total) by mouth daily with breakfast for 5 days, THEN 4 tablets (40 mg total) daily with breakfast for 2 days, THEN 3 tablets (30 mg total) daily with breakfast for 1 day, THEN 2 tablets (20 mg total) daily with breakfast for 1 day, THEN 1 tablet (10 mg total) daily with breakfast for 1 day, THEN 0.5 tablets (5 mg total) daily with breakfast for 1 day.  . SUMAtriptan (IMITREX) 50 MG tablet Take 1 tablet (50 mg total) by mouth once as needed for migraine. May repeat in 2 hours if headache persists or recurs.  . [DISCONTINUED] atorvastatin (LIPITOR) 80 MG tablet Take 1 tablet (80 mg total) by mouth daily.  . [DISCONTINUED] atorvastatin (LIPITOR) 80 MG tablet Take 1 tablet (80 mg total) by mouth daily.  . [DISCONTINUED] losartan (COZAAR) 50 MG tablet Take 1 tablet (50 mg total) by mouth daily.  . [DISCONTINUED] losartan (COZAAR) 50 MG tablet Take 1 tablet (50 mg total) by mouth daily.   No facility-administered encounter medications on file as of 02/02/2021.    Follow-up: Return in about 2 weeks  (around 02/16/2021).   Quincy Simmonds, MD

## 2021-02-07 NOTE — Progress Notes (Signed)
Internal Medicine Clinic Attending ? ?Case discussed with Dr. Liang  At the time of the visit.  We reviewed the resident?s history and exam and pertinent patient test results.  I agree with the assessment, diagnosis, and plan of care documented in the resident?s note. ? ?

## 2021-02-23 ENCOUNTER — Encounter: Payer: Self-pay | Admitting: Internal Medicine

## 2021-02-24 ENCOUNTER — Other Ambulatory Visit (HOSPITAL_COMMUNITY): Payer: Self-pay

## 2021-02-24 ENCOUNTER — Other Ambulatory Visit: Payer: Self-pay

## 2021-02-24 ENCOUNTER — Ambulatory Visit: Payer: Self-pay | Admitting: Internal Medicine

## 2021-02-24 ENCOUNTER — Encounter: Payer: Self-pay | Admitting: Internal Medicine

## 2021-02-24 VITALS — BP 175/89 | HR 90 | Temp 98.7°F | Ht 63.5 in | Wt 169.6 lb

## 2021-02-24 DIAGNOSIS — E119 Type 2 diabetes mellitus without complications: Secondary | ICD-10-CM

## 2021-02-24 DIAGNOSIS — I10 Essential (primary) hypertension: Secondary | ICD-10-CM

## 2021-02-24 DIAGNOSIS — E785 Hyperlipidemia, unspecified: Secondary | ICD-10-CM

## 2021-02-24 DIAGNOSIS — Z0000 Encounter for general adult medical examination without abnormal findings: Secondary | ICD-10-CM

## 2021-02-24 MED ORDER — METFORMIN HCL 500 MG PO TABS
1000.0000 mg | ORAL_TABLET | Freq: Two times a day (BID) | ORAL | 2 refills | Status: DC
  Filled 2021-02-24: qty 120, 30d supply, fill #0
  Filled 2021-03-29: qty 120, 30d supply, fill #1
  Filled 2021-11-22: qty 120, 30d supply, fill #2

## 2021-02-24 MED ORDER — LOSARTAN POTASSIUM-HCTZ 100-25 MG PO TABS
1.0000 | ORAL_TABLET | Freq: Every day | ORAL | 11 refills | Status: DC
  Filled 2021-02-24: qty 30, 30d supply, fill #0
  Filled 2021-03-29: qty 30, 30d supply, fill #1
  Filled 2021-05-16: qty 30, 30d supply, fill #2
  Filled 2021-06-24: qty 30, 30d supply, fill #3

## 2021-02-24 NOTE — Patient Instructions (Addendum)
Thank you for trusting me with your care. To recap, today we discussed the following:   1. Primary hypertension - losartan-hydrochlorothiazide (HYZAAR) 100-25 MG tablet; Take 1 tablet by mouth daily.  Dispense: 30 tablet; Refill: 11  2. Hyperlipidemia, unspecified hyperlipidemia type  - Lipid Profile - Continue atorvastatin  3. Type 2 diabetes mellitus without complication, without long-term current use of insulin (HCC)  - start metformin, titrate as described on instruction sheet.

## 2021-02-24 NOTE — Assessment & Plan Note (Addendum)
Currently taking HCTZ 12.5 mg and losartan 50 mg. No adverse side effects. Blood pressure readings at home have been elevated (150-200 systolic).  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 BP today 175/89. Goal <130/80 - Increase to HCTZ 25 mg - losartan 100 mg combination pill - Recheck in 1 month. If still elevated, consider adding amlodipine - Recheck BMP at next visit

## 2021-02-24 NOTE — Assessment & Plan Note (Addendum)
Reports healthy diet consisting mostly of vegetables. She does not eat meat and has eliminated sugar completely except for occasional juice. Exercise consists of walking one mile daily. A1C of 7.2 two weeks ago and 6.7 one month ago. Finished a course of prednisone one week ago. Denies increased urination, changes in vision, and numbness / tingling of her fingers / toes.   A&P: Recently diagnosed diabetes in the setting of metabolic syndrome and possible posterior circulation stroke last month. Uncontrolled with A1C of 7.2. Goal A1C <6.5 - Discussed diagnosis of diabetes at length along with common complications to avoid with appropriate medical management - Start metformin 500 mg twice daily - Increase to 150 minutes of high intensity exercise per week - Follow up in 3 months for A1C recheck. Consider increasing metformin to 1000 mg twice daily if still elevated.

## 2021-02-24 NOTE — Assessment & Plan Note (Signed)
Patient was offered screening tests today but opted to defer for 2 months until she qualifies for Medicare.

## 2021-02-24 NOTE — Progress Notes (Signed)
Attestation for Student Documentation:  I personally was present and performed or re-performed the history, physical exam and medical decision-making activities of this service and have verified that the service and findings are accurately documented in the student's note.  Albertha Ghee, MD 02/24/2021, 3:27 PM

## 2021-02-24 NOTE — Progress Notes (Addendum)
    Subjective:   Patient ID: Cheyenne Valenzuela female   DOB: Dec 26, 1955 65 y.o.   MRN: [id number]  HPI: Cheyenne Valenzuela is a 65 y.o. female with a past medical history of hypertension, migraine, intracranial atherosclerosis, diabetes, hyperlipidemia, and vertigo who presents today for a follow up for newly diagnosed diabetes.   Please see problem based charting for more details.    Past Medical History:  Diagnosis Date  . Hypercholesteremia   . Hypertension    Current Outpatient Medications  Medication Sig Dispense Refill  . losartan-hydrochlorothiazide (HYZAAR) 100-25 MG tablet Take 1 tablet by mouth daily. 30 tablet 11  . metFORMIN (GLUCOPHAGE) 500 MG tablet Take 2 tablets (1,000 mg total) by mouth 2 (two) times daily with a meal. Follow instruction given to titrate medication from 500 mg daily to 2000 mg daily. 120 tablet 2  . atorvastatin (LIPITOR) 80 MG tablet Take 1 tablet (80 mg total) by mouth daily. 30 tablet 3  . SUMAtriptan (IMITREX) 50 MG tablet Take 1 tablet (50 mg total) by mouth once as needed for migraine. May repeat in 2 hours if headache persists or recurs. 30 tablet 0   No current facility-administered medications for this visit.   Family History  Problem Relation Age of Onset  . Diabetes Sister    Social History   Socioeconomic History  . Marital status: Single    Spouse name: Not on file  . Number of children: Not on file  . Years of education: Not on file  . Highest education level: Not on file  Occupational History  . Occupation: Retired  Tobacco Use  . Smoking status: Never Smoker  . Smokeless tobacco: Never Used  Substance and Sexual Activity  . Alcohol use: No  . Drug use: Not on file  . Sexual activity: Not on file  Other Topics Concern  . Not on file  Social History Narrative  . Not on file   Social Determinants of Health   Financial Resource Strain: Not on file  Food Insecurity: Not on file  Transportation Needs: Not on file  Physical  Activity: Not on file  Stress: Not on file  Social Connections: Not on file   Review of Systems: Pertinent items noted in HPI and remainder of comprehensive ROS otherwise negative. Objective:  Physical Exam: Vitals:   02/24/21 1340  BP: (!) 175/89  Pulse: 90  Temp: 98.7 F (37.1 C)  TempSrc: Oral  SpO2: 99%  Weight: 169 lb 9.6 oz (76.9 kg)  Height: 5' 3.5" (1.613 m)    General: Well appearing and in no acute distress, appears stated age Neuro: A&O x4, normal affect Cardiovascular: RRR, no m/r/g. Normal S1 and S2 without S3 or S4. Pulses 2+ in bilateral UE and LE. No peripheral edema Pulmonary: CTAB, no wheezes, rhonchi or rales. Normal WOB, no clubbing  Skin: Warm and dry with no rashes, cuts, or bruises MSK: Normal ROM of all extremities. Strength 5/5 in bilateral UE and LE   Assessment & Plan:  Please see problem based charting for more details.

## 2021-02-24 NOTE — Assessment & Plan Note (Signed)
Started atorvastatin 80 mg one month ago and has been tolerating it well. Denies family history of hyperlipidemia. Reports having elevated lipids once several years ago.   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 LDL 331 one month ago with ASCVD risk of 22-31%. Goal LDL <70.  - Continue atorvastatin 80 mg  - Lipid panel today - expect 50% reduction in LDL. Consider adding ezetimibe at next visit.

## 2021-02-25 ENCOUNTER — Telehealth: Payer: Self-pay | Admitting: Internal Medicine

## 2021-02-25 DIAGNOSIS — E785 Hyperlipidemia, unspecified: Secondary | ICD-10-CM

## 2021-02-25 LAB — LIPID PANEL
Chol/HDL Ratio: 6.3 ratio — ABNORMAL HIGH (ref 0.0–4.4)
Cholesterol, Total: 220 mg/dL — ABNORMAL HIGH (ref 100–199)
HDL: 35 mg/dL — ABNORMAL LOW (ref >39)
LDL Chol Calc (NIH): 163 mg/dL — ABNORMAL HIGH (ref 0–99)
Triglycerides: 119 mg/dL (ref 0–149)
VLDL Cholesterol Cal: 22 mg/dL (ref 5–40)

## 2021-02-25 MED ORDER — EZETIMIBE 10 MG PO TABS: 10.0000 mg | ORAL_TABLET | Freq: Every day | ORAL | 1 refills | Status: DC

## 2021-02-25 MED ORDER — EZETIMIBE 10 MG PO TABS
10.0000 mg | ORAL_TABLET | Freq: Every day | ORAL | 1 refills | Status: DC
  Filled 2021-02-25: qty 30, 30d supply, fill #0

## 2021-02-25 NOTE — Telephone Encounter (Signed)
Attempted to call x 2. Patient has been started on high intensity statin for very elevated LDL with appropriate 50% reduction. LDL is now 163. I suspect recent event was a posterior stroke and would like to target LDL <70. Starting Ezetimide and expect Ezetimide to reduce lipids but will also need further reduction. Referring to lipid clinic for consideration of PCSK-9.  I will send this to patient via mychart.

## 2021-02-26 ENCOUNTER — Other Ambulatory Visit (HOSPITAL_COMMUNITY): Payer: Self-pay

## 2021-02-28 ENCOUNTER — Other Ambulatory Visit (HOSPITAL_COMMUNITY): Payer: Self-pay

## 2021-02-28 NOTE — Progress Notes (Signed)
Internal Medicine Clinic Attending  Case discussed with Dr. Steen  At the time of the visit.  We reviewed the resident's history and exam and pertinent patient test results.  I agree with the assessment, diagnosis, and plan of care documented in the resident's note.  

## 2021-03-02 NOTE — Addendum Note (Signed)
Addended by: Neomia Dear on: 03/02/2021 05:13 PM   Modules accepted: Orders

## 2021-03-09 ENCOUNTER — Other Ambulatory Visit (HOSPITAL_COMMUNITY): Payer: Self-pay

## 2021-03-14 ENCOUNTER — Encounter: Payer: Self-pay | Admitting: Internal Medicine

## 2021-03-29 ENCOUNTER — Other Ambulatory Visit (HOSPITAL_COMMUNITY): Payer: Self-pay

## 2021-04-12 ENCOUNTER — Encounter: Payer: Self-pay | Admitting: *Deleted

## 2021-05-16 ENCOUNTER — Other Ambulatory Visit (HOSPITAL_COMMUNITY): Payer: Self-pay

## 2021-06-24 ENCOUNTER — Encounter: Payer: Self-pay | Admitting: Internal Medicine

## 2021-06-24 ENCOUNTER — Other Ambulatory Visit: Payer: Self-pay

## 2021-06-24 ENCOUNTER — Ambulatory Visit (INDEPENDENT_AMBULATORY_CARE_PROVIDER_SITE_OTHER): Payer: Self-pay | Admitting: Internal Medicine

## 2021-06-24 ENCOUNTER — Other Ambulatory Visit (HOSPITAL_COMMUNITY): Payer: Self-pay

## 2021-06-24 DIAGNOSIS — R059 Cough, unspecified: Secondary | ICD-10-CM | POA: Insufficient documentation

## 2021-06-24 DIAGNOSIS — R0981 Nasal congestion: Secondary | ICD-10-CM

## 2021-06-24 HISTORY — DX: Cough, unspecified: R05.9

## 2021-06-24 MED ORDER — BENZONATATE 100 MG PO CAPS
200.0000 mg | ORAL_CAPSULE | Freq: Three times a day (TID) | ORAL | 0 refills | Status: DC | PRN
Start: 2021-06-24 — End: 2021-12-12
  Filled 2021-06-24: qty 30, 5d supply, fill #0

## 2021-06-24 MED ORDER — FLUTICASONE PROPIONATE 50 MCG/ACT NA SUSP
1.0000 | Freq: Every day | NASAL | 0 refills | Status: DC
  Filled 2021-06-24: qty 16, 60d supply, fill #0

## 2021-06-24 NOTE — Progress Notes (Signed)
  [facility] Internal Medicine Residency Telephone Encounter Continuity Care Appointment  HPI:  This telephone encounter was created for [name] on 06/24/2021 for the following purpose/cc cough nasal congestion.   Past Medical History:  Past Medical History:  Diagnosis Date   Hypercholesteremia    Hypertension      ROS:  Review of Systems  Constitutional:  Negative for chills, diaphoresis, fever, malaise/fatigue and weight loss.  Respiratory:  Positive for cough. Negative for hemoptysis, sputum production, shortness of breath and wheezing.   Cardiovascular:  Negative for chest pain and palpitations.  Gastrointestinal:  Negative for abdominal pain, constipation, diarrhea, nausea and vomiting.  Endo/Heme/Allergies:  Negative for environmental allergies.     Assessment / Plan / Recommendations:  Please see A&P under problem oriented charting for assessment of the patient's acute and chronic medical conditions.  As always, pt is advised that if symptoms worsen or new symptoms arise, they should go to an urgent care facility or to to ER for further evaluation.   Consent and Medical Decision Making:  Patient discussed with Dr. Mayford Knife This is a telephone encounter between Cheyenne Valenzuela and Dolan Amen on 06/24/2021 for cough and nasal congestion. The visit was conducted with the patient located at home and Dolan Amen at [facility]. The patient's identity was confirmed using their DOB and current address. The patient has consented to being evaluated through a telephone encounter and understands the associated risks (an examination cannot be done and the patient may need to come in for an appointment) / benefits (allows the patient to remain at home, decreasing exposure to coronavirus). I personally spent 10 minutes on medical discussion.

## 2021-06-24 NOTE — Assessment & Plan Note (Signed)
Patient presents with a 2-day history of cough and nasal congestion. This past Wednesday, patient developed nasal congestion and her cough began this Thursday.  Patient is tried Robitussin and Mucinex, and has not noticed any change in her symptoms. She denies fevers, myalgias, joint pain, abdominal pain, nausea, vomiting, diarrhea, constipation, environmental allergies, diaphoresis, or watery itchy eyes.  She did get a COVID test which came back negative.  Assessment: Patient presenting with nasal congestion and cough, with minimal relief with Mucinex and Robitussin.  Today we discussed Tessalon Perles for cough suppression, and Flonase for nonallergic rhinitis. - Continue Mucinex and Robitussin - Ordered Flonase 1 spray per nares daily - Tessalon Perles 100 3 times daily as needed

## 2021-06-27 NOTE — Progress Notes (Signed)
Internal Medicine Clinic Attending ? ?Case discussed with Dr. Winters  At the time of the visit.  We reviewed the resident?s history and exam and pertinent patient test results.  I agree with the assessment, diagnosis, and plan of care documented in the resident?s note.  ?

## 2021-08-29 ENCOUNTER — Other Ambulatory Visit: Payer: Self-pay

## 2021-08-29 ENCOUNTER — Ambulatory Visit (INDEPENDENT_AMBULATORY_CARE_PROVIDER_SITE_OTHER): Payer: Medicare Other | Admitting: Internal Medicine

## 2021-08-29 ENCOUNTER — Other Ambulatory Visit (HOSPITAL_COMMUNITY): Payer: Self-pay

## 2021-08-29 ENCOUNTER — Encounter: Payer: Self-pay | Admitting: Internal Medicine

## 2021-08-29 VITALS — BP 161/84 | HR 80 | Temp 98.1°F | Ht 63.0 in | Wt 171.9 lb

## 2021-08-29 DIAGNOSIS — Z1211 Encounter for screening for malignant neoplasm of colon: Secondary | ICD-10-CM | POA: Diagnosis not present

## 2021-08-29 DIAGNOSIS — Z0000 Encounter for general adult medical examination without abnormal findings: Secondary | ICD-10-CM

## 2021-08-29 DIAGNOSIS — E119 Type 2 diabetes mellitus without complications: Secondary | ICD-10-CM

## 2021-08-29 DIAGNOSIS — E785 Hyperlipidemia, unspecified: Secondary | ICD-10-CM

## 2021-08-29 DIAGNOSIS — I10 Essential (primary) hypertension: Secondary | ICD-10-CM | POA: Diagnosis not present

## 2021-08-29 LAB — POCT GLYCOSYLATED HEMOGLOBIN (HGB A1C): Hemoglobin A1C: 6.5 % — AB (ref 4.0–5.6)

## 2021-08-29 LAB — GLUCOSE, CAPILLARY: Glucose-Capillary: 115 mg/dL — ABNORMAL HIGH (ref 70–99)

## 2021-08-29 MED ORDER — LOSARTAN POTASSIUM-HCTZ 100-25 MG PO TABS
1.0000 | ORAL_TABLET | Freq: Every day | ORAL | 11 refills | Status: DC
  Filled 2021-08-29: qty 30, 30d supply, fill #0
  Filled 2021-11-22: qty 30, 30d supply, fill #1

## 2021-08-29 MED ORDER — EZETIMIBE 10 MG PO TABS
10.0000 mg | ORAL_TABLET | Freq: Every day | ORAL | 1 refills | Status: DC
  Filled 2021-08-29: qty 90, 90d supply, fill #0
  Filled 2022-01-10: qty 90, 90d supply, fill #1

## 2021-08-29 MED ORDER — AMLODIPINE BESYLATE 5 MG PO TABS
5.0000 mg | ORAL_TABLET | Freq: Every day | ORAL | 11 refills | Status: DC
  Filled 2021-08-29: qty 30, 30d supply, fill #0
  Filled 2021-11-21: qty 30, 30d supply, fill #1

## 2021-08-29 NOTE — Patient Instructions (Signed)
Thank you, Ms.Frederik Schmidt for allowing [place] to provide your care today.   1) I have added amlodipine 5 mg daily to your blood pressure medications.  2) I have added zetia to your cholesterol medications.  3) Your A1c is 6.5, which is better! Continue metformin and regular exercise.  4) I have placed a referral for colonoscopy and eye exam.  I have ordered the following labs for you:   Lab Orders         Glucose, capillary         BMP8+Anion Gap         POC Hbg A1C      Referrals ordered today:    Referral Orders         Ambulatory referral to Ophthalmology         Ambulatory referral to Gastroenterology      I have ordered the following medication/changed the following medications:   Stop the following medications: Medications Discontinued During This Encounter  Medication Reason   losartan-hydrochlorothiazide (HYZAAR) 100-25 MG tablet Reorder   ezetimibe (ZETIA) 10 MG tablet Reorder     Start the following medications: Meds ordered this encounter  Medications   losartan-hydrochlorothiazide (HYZAAR) 100-25 MG tablet    Sig: Take 1 tablet by mouth daily.    Dispense:  30 tablet    Refill:  11    IM program   amLODipine (NORVASC) 5 MG tablet    Sig: Take 1 tablet (5 mg total) by mouth daily.    Dispense:  30 tablet    Refill:  11   ezetimibe (ZETIA) 10 MG tablet    Sig: Take 1 tablet (10 mg total) by mouth daily.    Dispense:  90 tablet    Refill:  1    IM program     Follow up:  1 month    Remember: to bring your blood pressure log  Should you have any questions or concerns please call the internal medicine clinic at [phone].

## 2021-08-29 NOTE — Progress Notes (Signed)
   CC: routine follow-up   HPI:  Ms.Verena Derstine is a 65 y.o. with past medical history as noted below who presents to the clinic today for routine follow-up. Please see problem-based list for further details, assessments, and plans.  Past Medical History:  Diagnosis Date   Hypercholesteremia    Hypertension    Review of Systems:   Review of Systems  Constitutional: Negative.   HENT: Negative.    Eyes: Negative.   Respiratory: Negative.    Cardiovascular: Negative.   Gastrointestinal: Negative.   Genitourinary: Negative.   Musculoskeletal: Negative.   Skin: Negative.   Neurological: Negative.   Endo/Heme/Allergies: Negative.   Psychiatric/Behavioral: Negative.     Physical Exam:  Vitals:   08/29/21 1320 08/29/21 1422  BP: (!) 162/84 (!) 161/84  Pulse: 86 80  Temp: 98.1 F (36.7 C)   TempSrc: Oral   SpO2: 100%   Weight: 171 lb 14.4 oz (78 kg)   Height: 5\' 3"  (1.6 m)    Physical Exam Constitutional:      General: She is not in acute distress.    Appearance: Normal appearance.  HENT:     Head: Normocephalic and atraumatic.  Eyes:     Extraocular Movements: Extraocular movements intact.     Pupils: Pupils are equal, round, and reactive to light.  Cardiovascular:     Rate and Rhythm: Normal rate and regular rhythm.     Heart sounds: No murmur heard.   No friction rub. No gallop.  Pulmonary:     Effort: Pulmonary effort is normal.     Breath sounds: Normal breath sounds. No wheezing, rhonchi or rales.  Abdominal:     General: Abdomen is flat. There is no distension.  Musculoskeletal:        General: Normal range of motion.  Skin:    General: Skin is warm and dry.  Neurological:     Mental Status: She is alert and oriented to person, place, and time. Mental status is at baseline.  Psychiatric:        Mood and Affect: Mood normal.        Behavior: Behavior normal.    Assessment & Plan:   See Encounters Tab for problem based charting.  Patient seen with  Dr.  

## 2021-08-29 NOTE — Assessment & Plan Note (Addendum)
The patient got a screening mammogram recently which showed an area of some suspicion.  She is getting a diagnostic mammogram in 2 days. The patient last had a colonoscopy in 2014.  At that time, she was found to have a sessile, serrated polyp.  The patient states that this polyp was found then, however "I got an appendectomy and they found that there were no polyps."  Unable to see further details, however it appears that the patient has been recommended a repeat colonoscopy because of this polyp. She is also due for an ophthalmology eye exam for her diabetes.  Declined vaccines today.  -Referral to ophthalmology for eye exam -Referral to GI in [facility] for colonoscopy

## 2021-08-29 NOTE — Assessment & Plan Note (Addendum)
Patient is on Hyzaar 100-25 mg daily.  BP x2 are elevated in the clinic today (160s systolic).  -Added amlodipine 5 mg daily today -Patient given log to record her blood pressure at home, instructed to bring this to her next visit

## 2021-08-29 NOTE — Assessment & Plan Note (Signed)
Lipid panel from May showed total cholesterol 220, LDL 163, HDL 35.  This has gone down from her lipid panel from before, which showed LDL in the 300s.  She has been on atorvastatin 80 mg for the past 7 months.  -Given that her DL is still above goal, added ezetimibe 10 mg daily today

## 2021-08-29 NOTE — Assessment & Plan Note (Addendum)
Patient is on metformin 500 mg twice daily.  She has been exercising regularly, stating that she walks a few times a week.  A1c of 6.5 today, down from 7.2 in May.  -Continue current diabetic regimen

## 2021-08-30 LAB — BMP8+ANION GAP
Anion Gap: 15 mmol/L (ref 10.0–18.0)
BUN/Creatinine Ratio: 15 (ref 12–28)
BUN: 23 mg/dL (ref 8–27)
CO2: 24 mmol/L (ref 20–29)
Calcium: 9.7 mg/dL (ref 8.7–10.3)
Chloride: 102 mmol/L (ref 96–106)
Creatinine, Ser: 1.56 mg/dL — ABNORMAL HIGH (ref 0.57–1.00)
Glucose: 85 mg/dL (ref 70–99)
Potassium: 3.6 mmol/L (ref 3.5–5.2)
Sodium: 141 mmol/L (ref 134–144)
eGFR: 37 mL/min/{1.73_m2} — ABNORMAL LOW (ref >59)

## 2021-08-30 NOTE — Progress Notes (Signed)
Internal Medicine Clinic Attending ? ?I saw and evaluated the patient.  I personally confirmed the key portions of the history and exam documented by Dr. Bonanno and I reviewed pertinent patient test results.  The assessment, diagnosis, and plan were formulated together and I agree with the documentation in the resident?s note. ? ?

## 2021-11-21 ENCOUNTER — Other Ambulatory Visit: Payer: Self-pay

## 2021-11-21 ENCOUNTER — Other Ambulatory Visit (HOSPITAL_COMMUNITY): Payer: Self-pay

## 2021-11-21 ENCOUNTER — Ambulatory Visit (INDEPENDENT_AMBULATORY_CARE_PROVIDER_SITE_OTHER): Payer: Medicare Other | Admitting: Student

## 2021-11-21 ENCOUNTER — Encounter: Payer: Self-pay | Admitting: Student

## 2021-11-21 DIAGNOSIS — I10 Essential (primary) hypertension: Secondary | ICD-10-CM | POA: Diagnosis not present

## 2021-11-21 DIAGNOSIS — J101 Influenza due to other identified influenza virus with other respiratory manifestations: Secondary | ICD-10-CM

## 2021-11-21 HISTORY — DX: Influenza due to other identified influenza virus with other respiratory manifestations: J10.1

## 2021-11-21 NOTE — Assessment & Plan Note (Signed)
Patient with history of hypertension, on Hyzaar 100-25 mg daily and recently added Norvasc 5 mg daily.  Blood pressure is elevated today (150s to 160s systolics) despite recent addition of Norvasc.  Upon chart review, appears that patient has not refilled Hyzaar since last November.  Asked patient if she received refills and states that she is unsure.  Discussed that she has about 11 refills left at [facility] outpatient pharmacy and to obtain refills prior to additional increases in antihypertensive therapy.  She confirms understanding.  Advised patient to keep a blood pressure log until follow-up visit.  Plan: -Restart Hyzaar and Norvasc -Keep blood pressure log until next visit -Please confirm that she is obtaining refills prior to increasing antihypertensive therapy in the future

## 2021-11-21 NOTE — Progress Notes (Signed)
° °  CC: Follow-up of influenza, hypertension  HPI:  [name] is a 66 y.o. female with history listed below presenting to the [facility] for follow-up of influenza and hypertension. Please see individualized problem based charting for full HPI.  Past Medical History:  Diagnosis Date   Hypercholesteremia    Hypertension     Review of Systems:  Negative aside from that listed in individualized problem based charting.  Physical Exam:  Vitals:   11/21/21 1340  BP: (!) 155/99  Pulse: 82  Temp: 98.3 F (36.8 C)  TempSrc: Oral  SpO2: 100%  Weight: 166 lb 11.2 oz (75.6 kg)  Height: 5\' 3"  (1.6 m)   Physical Exam Constitutional:      Appearance: Normal appearance.  Eyes:     Extraocular Movements: Extraocular movements intact.     Conjunctiva/sclera: Conjunctivae normal.     Pupils: Pupils are equal, round, and reactive to light.  Cardiovascular:     Rate and Rhythm: Normal rate and regular rhythm.     Pulses: Normal pulses.     Heart sounds: Normal heart sounds. No murmur heard.   No gallop.  Pulmonary:     Effort: Pulmonary effort is normal.     Breath sounds: Normal breath sounds. No wheezing, rhonchi or rales.  Abdominal:     General: Bowel sounds are normal. There is no distension.     Palpations: Abdomen is soft.     Tenderness: There is no abdominal tenderness.  Musculoskeletal:        General: No swelling. Normal range of motion.  Skin:    General: Skin is warm and dry.  Neurological:     General: No focal deficit present.     Mental Status: She is alert and oriented to person, place, and time.  Psychiatric:        Mood and Affect: Mood normal.        Behavior: Behavior normal.     Assessment & Plan:   See Encounters Tab for problem based charting.  Patient discussed with Dr. 

## 2021-11-21 NOTE — Patient Instructions (Signed)
Cheyenne Valenzuela,  It was a pleasure seeing you in the clinic today.   I anticipate that your symptoms will improve over the next week or so. Please try your best to maintain nutrition. Please make sure to refill your blood pressure medications from the [facility]. Please keep a blood pressure log with you and bring it to your next visit. Please come back in 2 weeks.  Please call our clinic at [phone] if you have any questions or concerns. The best time to call is Monday-Friday from 9am-4pm, but there is someone available 24/7 at the same number. If you need medication refills, please notify your pharmacy one week in advance and they will send [place] a request.   Thank you for letting us take part in your care. We look forward to seeing you next time!

## 2021-11-21 NOTE — Assessment & Plan Note (Signed)
Patient tested positive for influenza A on 11/14/2021 at an urgent care and was thought to have a superimposed pneumonia on chest x-ray.  She was prescribed Tamiflu along with a 10-day course of Augmentin and a 5-day course of azithromycin for possible CAP coverage.  She presents today with persistence of fatigue.  States that she finished her course of antibiotics a couple of days ago and is now feeling a little better.  She actually reports more improvement after stopping antibiotics than while taking them.  Her main complaint today is that she is still having loss of appetite.  States that she is hungry but does not want to eat any food once she smells it.  Otherwise, her symptoms are gradually improving.  Discussed that patient's food aversion may be secondary to changes in taste with recent antiviral and antibiotic medications.  She does report that she was able to eat more yesterday and today after having stopped antibiotics on Saturday.  Advised patient to continue with p.o. intake as tolerated and to supplement with protein shakes to prevent malnutrition.  Expect for her symptoms to continue to improve given that influenza A diagnosis was just 1 week ago.  She confirms understanding.  Discussed calling [facility] should her symptoms persist or worsen over this next week.  Plan: -Increase p.o. intake as tolerated -Follow-up in 2 weeks

## 2021-11-22 ENCOUNTER — Other Ambulatory Visit (HOSPITAL_COMMUNITY): Payer: Self-pay

## 2021-11-23 NOTE — Progress Notes (Signed)
Internal Medicine Clinic Attending  Case discussed with Dr. Jinwala  At the time of the visit.  We reviewed the resident's history and exam and pertinent patient test results.  I agree with the assessment, diagnosis, and plan of care documented in the resident's note.  

## 2021-12-01 ENCOUNTER — Other Ambulatory Visit (HOSPITAL_COMMUNITY): Payer: Self-pay

## 2021-12-05 ENCOUNTER — Encounter: Payer: Medicare Other | Admitting: Student

## 2021-12-12 ENCOUNTER — Ambulatory Visit (INDEPENDENT_AMBULATORY_CARE_PROVIDER_SITE_OTHER): Payer: Medicare Other | Admitting: Internal Medicine

## 2021-12-12 ENCOUNTER — Encounter: Payer: Self-pay | Admitting: Internal Medicine

## 2021-12-12 ENCOUNTER — Other Ambulatory Visit: Payer: Self-pay

## 2021-12-12 VITALS — BP 163/80 | HR 85 | Wt 166.5 lb

## 2021-12-12 DIAGNOSIS — E119 Type 2 diabetes mellitus without complications: Secondary | ICD-10-CM | POA: Diagnosis not present

## 2021-12-12 DIAGNOSIS — Z23 Encounter for immunization: Secondary | ICD-10-CM

## 2021-12-12 DIAGNOSIS — E782 Mixed hyperlipidemia: Secondary | ICD-10-CM | POA: Diagnosis not present

## 2021-12-12 DIAGNOSIS — I10 Essential (primary) hypertension: Secondary | ICD-10-CM | POA: Diagnosis not present

## 2021-12-12 DIAGNOSIS — Z1211 Encounter for screening for malignant neoplasm of colon: Secondary | ICD-10-CM

## 2021-12-12 LAB — GLUCOSE, CAPILLARY: Glucose-Capillary: 93 mg/dL (ref 70–99)

## 2021-12-12 LAB — POCT GLYCOSYLATED HEMOGLOBIN (HGB A1C): Hemoglobin A1C: 6.5 % — AB (ref 4.0–5.6)

## 2021-12-12 MED ORDER — AMLODIPINE BESYLATE 10 MG PO TABS
10.0000 mg | ORAL_TABLET | Freq: Every day | ORAL | 11 refills | Status: DC
Start: 2021-12-12 — End: 2022-05-12
  Filled 2021-12-12 – 2022-01-10 (×2): qty 30, 30d supply, fill #0
  Filled 2022-02-06: qty 30, 30d supply, fill #1
  Filled 2022-03-16: qty 30, 30d supply, fill #2

## 2021-12-12 MED ORDER — METFORMIN HCL 500 MG PO TABS
1000.0000 mg | ORAL_TABLET | Freq: Two times a day (BID) | ORAL | 2 refills | Status: DC
  Filled 2021-12-12: qty 120, 30d supply, fill #0

## 2021-12-12 NOTE — Progress Notes (Signed)
? ?  CC: HTN ? ?HPI: ? ?[name] is a 66 y.o. with a PMHx as listed below who presents to the clinic for HTN.  ? ?Please see the Encounters tab for problem-based Assessment & Plan regarding status of patient's acute and chronic conditions. ? ?Past Medical History:  ?Diagnosis Date  ? Cough 06/24/2021  ? Hypercholesteremia   ? Hypertension   ? Influenza A 11/21/2021  ? ?Review of Systems: Review of Systems  ?Constitutional:  Negative for chills, fever, malaise/fatigue and weight loss.  ?Eyes:  Negative for blurred vision and double vision.  ?Respiratory:  Negative for cough and shortness of breath.   ?Cardiovascular:  Negative for chest pain and leg swelling.  ?Neurological:  Negative for dizziness and headaches.  ? ?Physical Exam: ? ?There were no vitals filed for this visit. ?Physical Exam ?Vitals and nursing note reviewed.  ?Constitutional:   ?   General: She is not in acute distress. ?   Appearance: She is normal weight.  ?Cardiovascular:  ?   Rate and Rhythm: Normal rate and regular rhythm.  ?   Heart sounds: No murmur heard. ?Pulmonary:  ?   Effort: Pulmonary effort is normal. No respiratory distress.  ?   Breath sounds: Normal breath sounds. No wheezing, rhonchi or rales.  ?Abdominal:  ?   General: Bowel sounds are normal. There is no distension.  ?   Palpations: Abdomen is soft.  ?   Tenderness: There is no abdominal tenderness. There is no guarding.  ?Musculoskeletal:  ?   Right lower leg: No edema.  ?   Left lower leg: No edema.  ?Skin: ?   General: Skin is warm and dry.  ?Neurological:  ?   General: No focal deficit present.  ?   Mental Status: She is alert and oriented to person, place, and time. Mental status is at baseline.  ?Psychiatric:     ?   Mood and Affect: Mood normal.     ?   Behavior: Behavior normal.     ?   Thought Content: Thought content normal.     ?   Judgment: Judgment normal.  ? ? ?Assessment & Plan:  ? ?See Encounters Tab for problem based charting. ? ?Patient discussed with Dr.   Lafonda Mosses ? ?

## 2021-12-12 NOTE — Assessment & Plan Note (Signed)
Ms. Ewton states that she continues to take atorvastatin 80 mg daily and Zetia 10 mg daily.  No difficulty with medications at this time. ? ?Assessment/plan: ?- Continue with current management ?- Lipid panel pending ?

## 2021-12-12 NOTE — Assessment & Plan Note (Addendum)
Cheyenne Valenzuela states that she restarted her amlodipine and losartan-HCTZ approximately 1 week ago. Prior to that, she was feeling under the weather due to influenza and had not been taking her medications.  Of note, she states she is taking 2 tablets of amlodipine, to equal 10 mg/day. She has been taking her blood pressure at home since February 15 with SBP's ranging between 147-165.  She denies any chest pain, shortness of breath, orthopnea, leg swelling.  She denies any known snoring at home but states she regularly wakes up from her sleep not feeling refreshed.  She denies any known kidney disease. ? ?Assessment/plan: ?On review of patient's blood pressure log at home, there is no significant change in her blood pressure from when she restarted her medications.  At this time, Cheyenne Valenzuela would like to hold off on adding any additional agents. However we discussed extensively the importance of working up secondary causes given that she is maximized on 3 agents. ? ?- Continue amlodipine 10 mg daily and losartan-HCTZ 100-25 mg daily ?- Sleep study ordered ?- BMP ordered and pending ?- Follow-up in 3/4 weeks.  Counseled patient that if blood pressure remains uncontrolled at that time, she would likely need an additional medication.  Would consider beta-blocker such as Coreg ?

## 2021-12-12 NOTE — Patient Instructions (Signed)
It was nice seeing you today! Thank you for choosing [facility] for your Primary Care.  ?  ?Today we talked about:  ? ?High blood pressure: Continue taking Amlodipine 10 mg and Losartan-HCTZ 100-25 mg daily. We are going to order a sleep study to check for sleep apnea.  ?Let's follow up in 3-4 weeks. If you pressure is still high then, we will likely need to add an additional medication ?I have ordered blood work today. I will call you with the results.  ?

## 2021-12-12 NOTE — Assessment & Plan Note (Signed)
Cheyenne Valenzuela states that she has been taking metformin daily without difficulties. ? ?Assessment/plan: ?Lab Results  ?Component Value Date  ? HGBA1C 6.5 (A) 12/12/2021  ? ?A1c stable at this time.  No medication changes made.  Metformin refilled. ? ?- Continue metformin 1000 mg twice daily ?- Follow-up in 3 months with PCP ?

## 2021-12-13 ENCOUNTER — Other Ambulatory Visit (HOSPITAL_COMMUNITY): Payer: Self-pay

## 2021-12-14 ENCOUNTER — Other Ambulatory Visit (HOSPITAL_COMMUNITY): Payer: Self-pay

## 2021-12-14 LAB — LIPID PANEL
Chol/HDL Ratio: 11.4 ratio — ABNORMAL HIGH (ref 0.0–4.4)
Cholesterol, Total: 342 mg/dL — ABNORMAL HIGH (ref 100–199)
HDL: 30 mg/dL — ABNORMAL LOW (ref >39)
LDL Chol Calc (NIH): 267 mg/dL — ABNORMAL HIGH (ref 0–99)
Triglycerides: 216 mg/dL — ABNORMAL HIGH (ref 0–149)
VLDL Cholesterol Cal: 45 mg/dL — ABNORMAL HIGH (ref 5–40)

## 2021-12-14 LAB — BMP8+ANION GAP
Anion Gap: 14 mmol/L (ref 10.0–18.0)
BUN/Creatinine Ratio: 16 (ref 12–28)
BUN: 16 mg/dL (ref 8–27)
CO2: 25 mmol/L (ref 20–29)
Calcium: 9.9 mg/dL (ref 8.7–10.3)
Chloride: 103 mmol/L (ref 96–106)
Creatinine, Ser: 1.01 mg/dL — ABNORMAL HIGH (ref 0.57–1.00)
Glucose: 97 mg/dL (ref 70–99)
Potassium: 3.8 mmol/L (ref 3.5–5.2)
Sodium: 142 mmol/L (ref 134–144)
eGFR: 62 mL/min/{1.73_m2} (ref >59)

## 2021-12-14 NOTE — Progress Notes (Signed)
Internal Medicine Clinic Attending  Case discussed with Dr. Basaraba  At the time of the visit.  We reviewed the resident's history and exam and pertinent patient test results.  I agree with the assessment, diagnosis, and plan of care documented in the resident's note.  

## 2021-12-15 ENCOUNTER — Encounter: Payer: Self-pay | Admitting: *Deleted

## 2021-12-15 ENCOUNTER — Telehealth: Payer: Self-pay | Admitting: Internal Medicine

## 2021-12-15 ENCOUNTER — Other Ambulatory Visit (HOSPITAL_COMMUNITY): Payer: Self-pay

## 2021-12-15 NOTE — Telephone Encounter (Signed)
Attempted to contact Cheyenne Valenzuela regarding her lipid panel. No answer. VM left.  ?

## 2021-12-16 ENCOUNTER — Telehealth: Payer: Self-pay

## 2021-12-16 ENCOUNTER — Other Ambulatory Visit (HOSPITAL_COMMUNITY): Payer: Self-pay

## 2021-12-16 NOTE — Telephone Encounter (Signed)
Need 100 day prescriptions sent for Hyzaar, Metformin and Atorvastatin. ?

## 2021-12-16 NOTE — Telephone Encounter (Signed)
Aneta Mins from [facility] called he stated the pt insurance is requesting a 100 day supply for the following rx : ?metFORMIN (GLUCOPHAGE) 500 MG tablet ? ?atorvastatin (LIPITOR) 80 MG tablet ? ?losartan-hydrochlorothiazide (HYZAAR) 100-25 MG tablet ? ?Aneta Mins call [phone] ?

## 2021-12-16 NOTE — Telephone Encounter (Signed)
Patient called she is returning your call regarding her lab results call back:[phone] ?

## 2021-12-18 ENCOUNTER — Other Ambulatory Visit: Payer: Self-pay | Admitting: Internal Medicine

## 2021-12-18 DIAGNOSIS — E119 Type 2 diabetes mellitus without complications: Secondary | ICD-10-CM

## 2021-12-18 DIAGNOSIS — E785 Hyperlipidemia, unspecified: Secondary | ICD-10-CM

## 2021-12-18 DIAGNOSIS — I10 Essential (primary) hypertension: Secondary | ICD-10-CM

## 2021-12-18 MED ORDER — LOSARTAN POTASSIUM-HCTZ 100-25 MG PO TABS
1.0000 | ORAL_TABLET | Freq: Every day | ORAL | 2 refills | Status: DC
  Filled 2021-12-18: qty 100, 100d supply, fill #0
  Filled 2022-01-10: qty 90, 90d supply, fill #0
  Filled 2022-03-16: qty 90, 90d supply, fill #1

## 2021-12-18 MED ORDER — METFORMIN HCL 1000 MG PO TABS
1000.0000 mg | ORAL_TABLET | Freq: Two times a day (BID) | ORAL | 2 refills | Status: DC
  Filled 2021-12-18: qty 200, 100d supply, fill #0

## 2021-12-18 MED ORDER — ATORVASTATIN CALCIUM 80 MG PO TABS
80.0000 mg | ORAL_TABLET | Freq: Every day | ORAL | 2 refills | Status: DC
Start: 2021-12-18 — End: 2022-05-12
  Filled 2021-12-18: qty 100, 100d supply, fill #0
  Filled 2022-01-10: qty 90, 90d supply, fill #0
  Filled 2022-03-16: qty 90, 90d supply, fill #1

## 2021-12-18 NOTE — Progress Notes (Signed)
Refills sent for 100 day supply as requested. ? ?- metFORMIN (GLUCOPHAGE) 1000 MG tablet; Take 1 tablet (1,000 mg total) by mouth 2 (two) times daily with a meal.  Dispense: 200 tablet; Refill: 2 ?- losartan-hydrochlorothiazide (HYZAAR) 100-25 MG tablet; Take 1 tablet by mouth daily.  Dispense: 100 tablet; Refill: 2 ?- atorvastatin (LIPITOR) 80 MG tablet; Take 1 tablet (80 mg total) by mouth daily.  Dispense: 100 tablet; Refill: 2 ? ? ? ? ?

## 2021-12-19 ENCOUNTER — Telehealth: Payer: Self-pay | Admitting: Internal Medicine

## 2021-12-19 ENCOUNTER — Other Ambulatory Visit (HOSPITAL_COMMUNITY): Payer: Self-pay

## 2021-12-19 DIAGNOSIS — E785 Hyperlipidemia, unspecified: Secondary | ICD-10-CM

## 2021-12-19 NOTE — Telephone Encounter (Signed)
Contacted Cheyenne Valenzuela today and discussed her most recent lab work, specifically her lipid panel. I explained that total cholesterol and LDL are markedly elevated. Cheyenne Valenzuela reminds me that she was not taking her medications for ~ 2 weeks after feeling under the weather due to Influenza. She has been taking both Atorvastatin and Zetia daily without missing a dose since our last appointment on 3/06. I discussed the options of rechecking lipid panel at her next visit versus referral to Cardiology lipid clinic. Cheyenne Valenzuela would like to recheck first; she is in agreement with referral if cholesterol remains markedly above goal.  ?

## 2021-12-19 NOTE — Progress Notes (Signed)
Renal function improved drastically. Lipid panel with markedly elevated total cholesterol and LDL. Please see telephone note for details.

## 2021-12-19 NOTE — Assessment & Plan Note (Signed)
Contacted [name] today and discussed her most recent lab work, specifically her lipid panel. I explained that total cholesterol and LDL are markedly elevated. [name] reminds me that she was not taking her medications for ~ 2 weeks after feeling under the weather due to Influenza. She has been taking both Atorvastatin and Zetia daily without missing a dose since our last appointment on 3/06.  ? ?I discussed the options of rechecking lipid panel at her next visit versus referral to Cardiology lipid clinic. [name] would like to recheck first; she is in agreement with referral if cholesterol remains markedly above goal.  ?

## 2021-12-27 ENCOUNTER — Other Ambulatory Visit (HOSPITAL_COMMUNITY): Payer: Self-pay

## 2022-01-10 ENCOUNTER — Ambulatory Visit (INDEPENDENT_AMBULATORY_CARE_PROVIDER_SITE_OTHER): Payer: Medicare Other | Admitting: Internal Medicine

## 2022-01-10 ENCOUNTER — Other Ambulatory Visit (HOSPITAL_COMMUNITY): Payer: Self-pay

## 2022-01-10 VITALS — BP 159/90 | HR 78 | Temp 98.5°F | Ht 63.0 in | Wt 168.2 lb

## 2022-01-10 DIAGNOSIS — E785 Hyperlipidemia, unspecified: Secondary | ICD-10-CM | POA: Diagnosis not present

## 2022-01-10 DIAGNOSIS — I10 Essential (primary) hypertension: Secondary | ICD-10-CM

## 2022-01-10 DIAGNOSIS — Z1211 Encounter for screening for malignant neoplasm of colon: Secondary | ICD-10-CM | POA: Diagnosis not present

## 2022-01-10 NOTE — Patient Instructions (Addendum)
Thank you for trusting me with your care. To recap, today we discussed the following: ? ? ?1. Hyperlipidemia, unspecified hyperlipidemia type ?- Lipid Profile ? ?2. Hypertension ?- Continue Current regimen ?- Follow packet given for advice on measuring blood pressure. ? ?- Follow up in 1 month for blood pressure log review ? ? ? ?

## 2022-01-10 NOTE — Progress Notes (Signed)
? ?  CC: high cholesterol and high blood pressure ? ?HPI:[name] is a 66 y.o. female who presents for evaluation of hyperlipidemia and hyertension. Please see individual problem based A/P for details. ? ?Past Medical History:  ?Diagnosis Date  ? Cough 06/24/2021  ? Hypercholesteremia   ? Hypertension   ? Influenza A 11/21/2021  ? ?Review of Systems:   ?Review of Systems  ?Constitutional:  Negative for chills and fever.  ?Eyes:  Negative for blurred vision.  ?Neurological:  Negative for dizziness and headaches.   ? ?Physical Exam: ?Vitals:  ? 01/10/22 1411  ?BP: (!) 157/104  ?Pulse: 89  ?Temp: 98.5 ?F (36.9 ?C)  ?TempSrc: Oral  ?SpO2: 100%  ?Weight: 168 lb 3.2 oz (76.3 kg)  ?Height: 5\' 3"  (1.6 m)  ? ?Physical Exam ?Constitutional:   ?   Appearance: Normal appearance. She is normal weight.  ?Cardiovascular:  ?   Rate and Rhythm: Normal rate and regular rhythm.  ?Pulmonary:  ?   Effort: Pulmonary effort is normal.  ?   Breath sounds: Normal breath sounds.  ?Skin: ?   General: Skin is warm and dry.  ?Psychiatric:     ?   Mood and Affect: Mood normal.     ?   Behavior: Behavior normal.  ? ? ? ?Assessment & Plan:  ? ?See Encounters Tab for problem based charting. ? ?Patient discussed with Dr. ? ?

## 2022-01-11 ENCOUNTER — Encounter: Payer: Self-pay | Admitting: Internal Medicine

## 2022-01-11 LAB — LIPID PANEL
Chol/HDL Ratio: 6.8 ratio — ABNORMAL HIGH (ref 0.0–4.4)
Cholesterol, Total: 217 mg/dL — ABNORMAL HIGH (ref 100–199)
HDL: 32 mg/dL — ABNORMAL LOW (ref >39)
LDL Chol Calc (NIH): 149 mg/dL — ABNORMAL HIGH (ref 0–99)
Triglycerides: 197 mg/dL — ABNORMAL HIGH (ref 0–149)
VLDL Cholesterol Cal: 36 mg/dL (ref 5–40)

## 2022-01-11 LAB — FECAL OCCULT BLOOD, IMMUNOCHEMICAL: Fecal Occult Bld: NEGATIVE

## 2022-01-11 NOTE — Assessment & Plan Note (Signed)
See previous note from visit on 3/13. Rechecking lipid panel today. She has been taking atorvastatin and Zetia.  ?

## 2022-01-11 NOTE — Assessment & Plan Note (Signed)
Her for follow up of hypertension. Seen on 3/6 and she has kept a blood pressure log. Her SBP < 130 >50% of check. Wide ranged of blood pressure, with the lowest SBP 117 and the hightest reading 170. She has not been using a specific technique when checking blood pressure. She was provide with a packet of information today ( info on proper way to check BP, dash diet, and log) . She will keep this and given pressures are controlled at times we will defer workup for secondary cause at this time. Also looking at her refills it appeared she was not filling medication. She insured me this was not the case and she has medications at home. They are not cost preventative. Sleep study has not been scheduled yet. Plan to have patient follow up in one month so we can review blood pressure log.  ?

## 2022-01-11 NOTE — Progress Notes (Signed)
Internal Medicine Clinic Attending  Case discussed with Dr. Steen  At the time of the visit.  We reviewed the resident's history and exam and pertinent patient test results.  I agree with the assessment, diagnosis, and plan of care documented in the resident's note.  

## 2022-01-17 NOTE — Progress Notes (Signed)
FIT testing negative. Next colon cancer screening due 12/2022.

## 2022-01-31 ENCOUNTER — Encounter: Payer: Medicare Other | Admitting: Internal Medicine

## 2022-02-03 ENCOUNTER — Observation Stay (HOSPITAL_COMMUNITY): Payer: Medicare Other

## 2022-02-03 ENCOUNTER — Ambulatory Visit (INDEPENDENT_AMBULATORY_CARE_PROVIDER_SITE_OTHER): Payer: Medicare Other | Admitting: Internal Medicine

## 2022-02-03 ENCOUNTER — Other Ambulatory Visit: Payer: Self-pay

## 2022-02-03 ENCOUNTER — Observation Stay (HOSPITAL_COMMUNITY)
Admission: RE | Admit: 2022-02-03 | Discharge: 2022-02-03 | Disposition: A | Discharge status: Home / Self Care | Payer: Medicare Other | Source: Ambulatory Visit | Attending: Internal Medicine | Admitting: Internal Medicine

## 2022-02-03 ENCOUNTER — Encounter (HOSPITAL_COMMUNITY): Payer: Self-pay | Admitting: *Deleted

## 2022-02-03 ENCOUNTER — Encounter: Payer: Self-pay | Admitting: Internal Medicine

## 2022-02-03 ENCOUNTER — Observation Stay (HOSPITAL_COMMUNITY)
Admission: EM | Admit: 2022-02-03 | Discharge: 2022-02-04 | Disposition: A | Discharge status: Home / Self Care | Payer: Medicare Other | Attending: Internal Medicine | Admitting: Internal Medicine

## 2022-02-03 VITALS — BP 152/79 | HR 94 | Temp 99.2°F | Resp 28 | Ht 63.0 in | Wt 170.8 lb

## 2022-02-03 DIAGNOSIS — R42 Dizziness and giddiness: Secondary | ICD-10-CM | POA: Diagnosis not present

## 2022-02-03 DIAGNOSIS — Z7984 Long term (current) use of oral hypoglycemic drugs: Secondary | ICD-10-CM | POA: Diagnosis not present

## 2022-02-03 DIAGNOSIS — E119 Type 2 diabetes mellitus without complications: Secondary | ICD-10-CM | POA: Diagnosis not present

## 2022-02-03 DIAGNOSIS — I63531 Cerebral infarction due to unspecified occlusion or stenosis of right posterior cerebral artery: Secondary | ICD-10-CM

## 2022-02-03 DIAGNOSIS — Z79899 Other long term (current) drug therapy: Secondary | ICD-10-CM | POA: Insufficient documentation

## 2022-02-03 DIAGNOSIS — I10 Essential (primary) hypertension: Secondary | ICD-10-CM | POA: Insufficient documentation

## 2022-02-03 DIAGNOSIS — I639 Cerebral infarction, unspecified: Secondary | ICD-10-CM | POA: Diagnosis not present

## 2022-02-03 LAB — DIFFERENTIAL
Abs Immature Granulocytes: 0.05 10*3/uL (ref 0.00–0.07)
Basophils Absolute: 0.1 10*3/uL (ref 0.0–0.1)
Basophils Relative: 0 %
Eosinophils Absolute: 0.1 10*3/uL (ref 0.0–0.5)
Eosinophils Relative: 1 %
Immature Granulocytes: 0 %
Lymphocytes Relative: 20 %
Lymphs Abs: 3.1 10*3/uL (ref 0.7–4.0)
Monocytes Absolute: 1.1 10*3/uL — ABNORMAL HIGH (ref 0.1–1.0)
Monocytes Relative: 7 %
Neutro Abs: 11.1 10*3/uL — ABNORMAL HIGH (ref 1.7–7.7)
Neutrophils Relative %: 72 %

## 2022-02-03 LAB — COMPREHENSIVE METABOLIC PANEL
ALT: 18 U/L (ref 0–44)
AST: 18 U/L (ref 15–41)
Albumin: 3.5 g/dL (ref 3.5–5.0)
Alkaline Phosphatase: 85 U/L (ref 38–126)
Anion gap: 9 (ref 5–15)
BUN: 12 mg/dL (ref 8–23)
CO2: 27 mmol/L (ref 22–32)
Calcium: 9.3 mg/dL (ref 8.9–10.3)
Chloride: 102 mmol/L (ref 98–111)
Creatinine, Ser: 0.87 mg/dL (ref 0.44–1.00)
GFR, Estimated: >60 mL/min (ref >60)
Glucose, Bld: 180 mg/dL — ABNORMAL HIGH (ref 70–99)
Potassium: 3.5 mmol/L (ref 3.5–5.1)
Sodium: 138 mmol/L (ref 135–145)
Total Bilirubin: 0.5 mg/dL (ref 0.3–1.2)
Total Protein: 7.7 g/dL (ref 6.5–8.1)

## 2022-02-03 LAB — PROTIME-INR
INR: 1 (ref 0.8–1.2)
Prothrombin Time: 12.9 seconds (ref 11.4–15.2)

## 2022-02-03 LAB — CBC
HCT: 35.7 % — ABNORMAL LOW (ref 36.0–46.0)
Hemoglobin: 12 g/dL (ref 12.0–15.0)
MCH: 28.8 pg (ref 26.0–34.0)
MCHC: 33.6 g/dL (ref 30.0–36.0)
MCV: 85.6 fL (ref 80.0–100.0)
Platelets: 501 10*3/uL — ABNORMAL HIGH (ref 150–400)
RBC: 4.17 MIL/uL (ref 3.87–5.11)
RDW: 19.4 % — ABNORMAL HIGH (ref 11.5–15.5)
WBC: 15.6 10*3/uL — ABNORMAL HIGH (ref 4.0–10.5)
nRBC: 0 % (ref 0.0–0.2)

## 2022-02-03 LAB — CBG MONITORING, ED: Glucose-Capillary: 185 mg/dL — ABNORMAL HIGH (ref 70–99)

## 2022-02-03 LAB — APTT: aPTT: 30 seconds (ref 24–36)

## 2022-02-03 IMAGING — CT CT HEAD W/O CM
4 series · 16 of 47 positions shown, 18 images · non-contrast
Comparison: Same day MRI head.  CT head [DATE].

CLINICAL DATA: Dizziness, persistent/recurrent, cardiac or vascular
cause suspected



[Series 3: head without · axial · non-contrast · 0.44mm/px · z∈[-163,-43]mm · 7 of 33 slices shown, 9 images]
[im 5/33  brain]
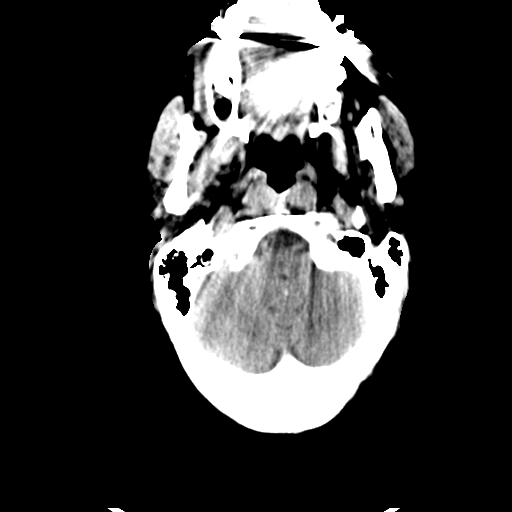
[im 5/33  bone]
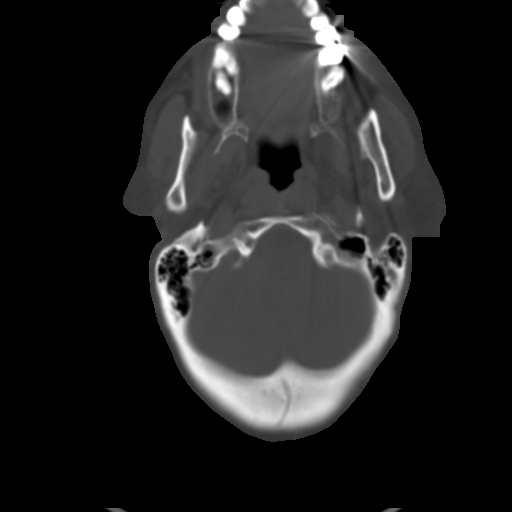
[im 9/33  brain]
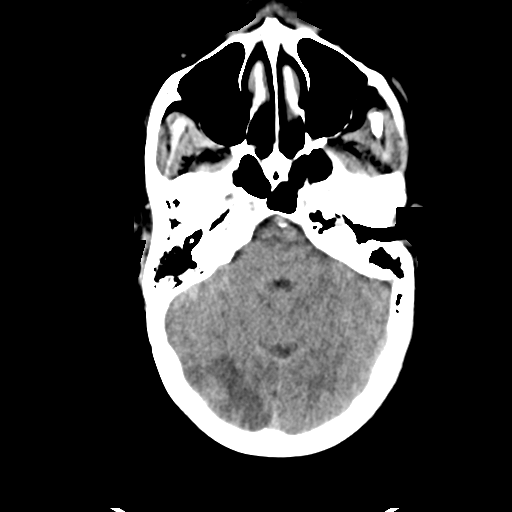
[im 13/33  brain]
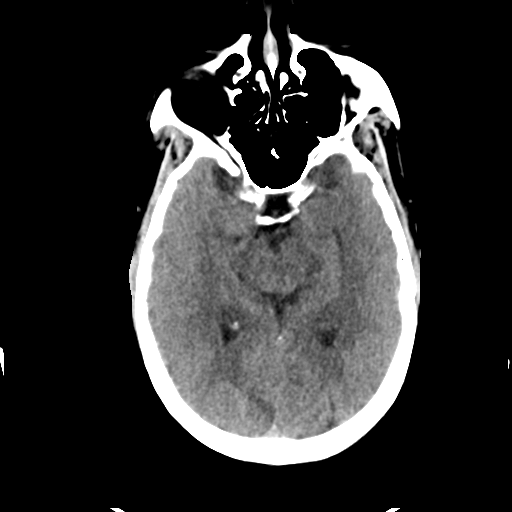
[im 17/33  brain]
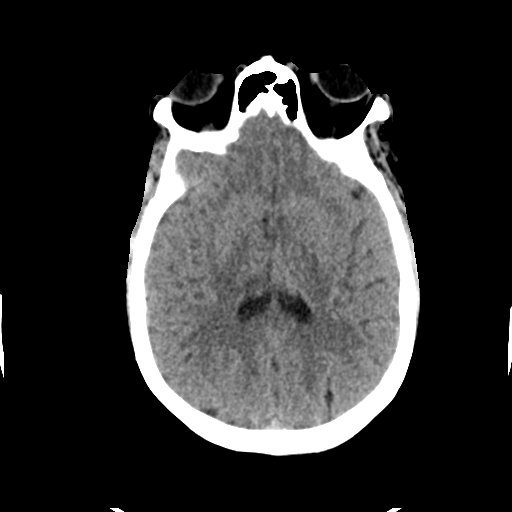
[im 21/33  brain]
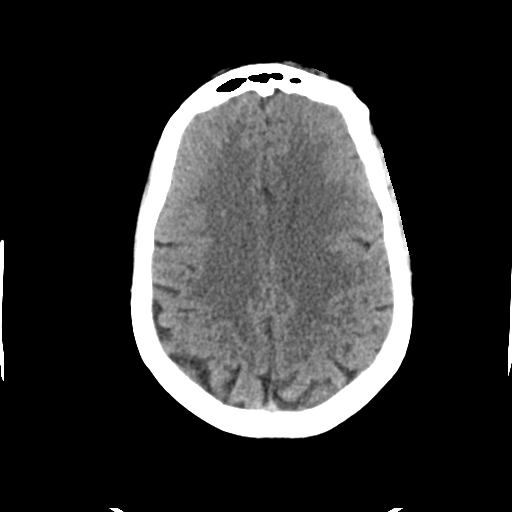
[im 21/33  bone]
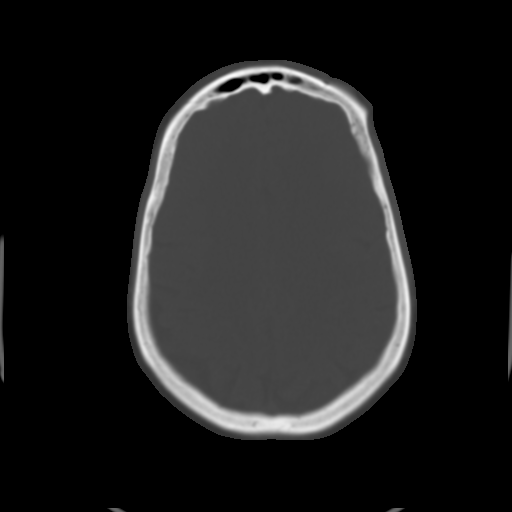
[im 25/33  brain]
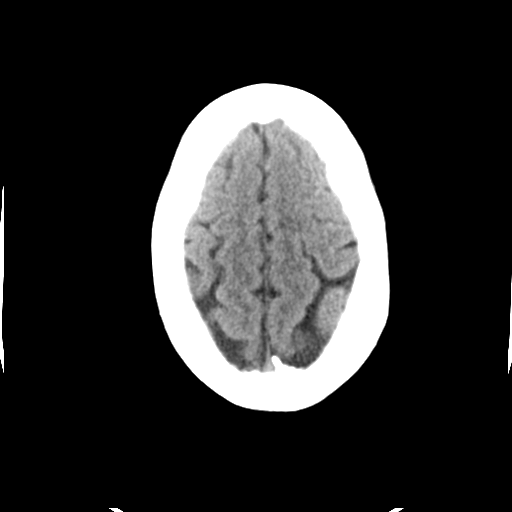
[im 29/33  brain]
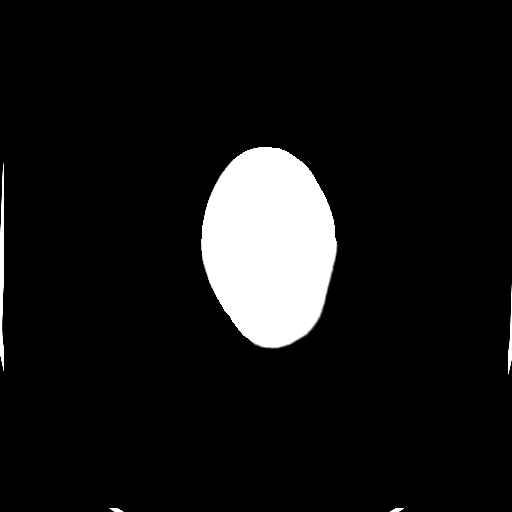

[Series 4: head bone · axial · 0.44mm/px · z∈[-167,-135]mm · 3 of 82 slices shown]
[im 9/82  bone]
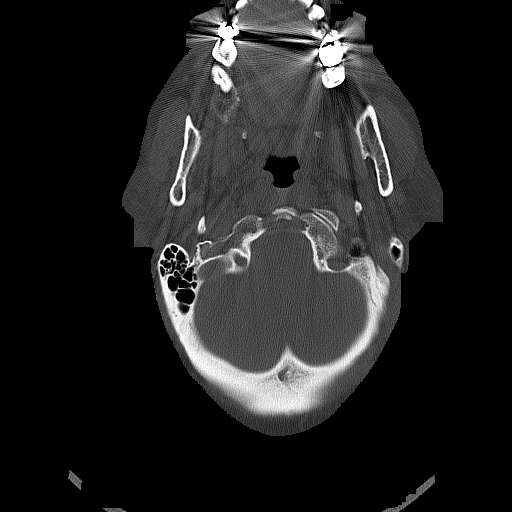
[im 17/82  bone]
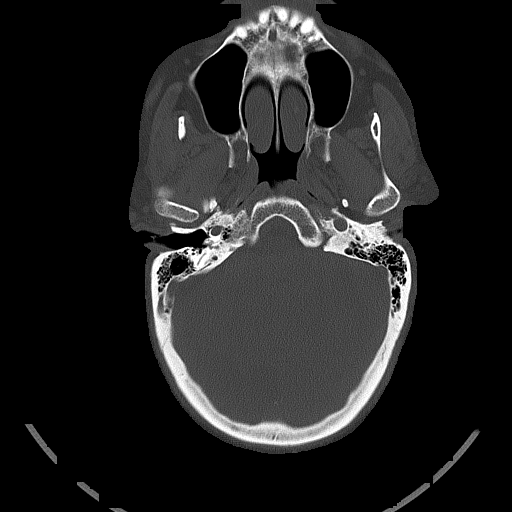
[im 25/82  bone]
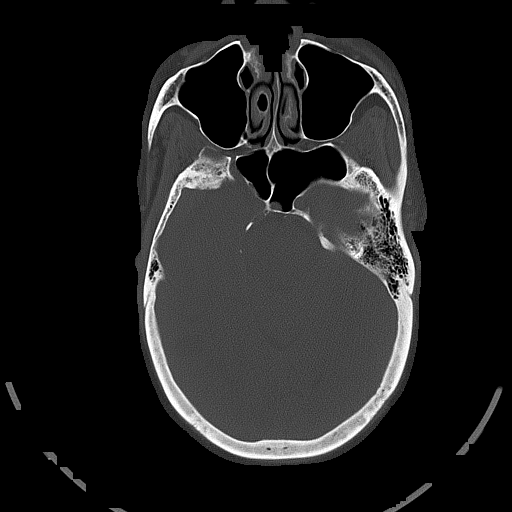

[Series 5: head without cor · coronal · non-contrast · 0.33mm/px · 3 of 67 slices shown]
[im 23/67  brain]
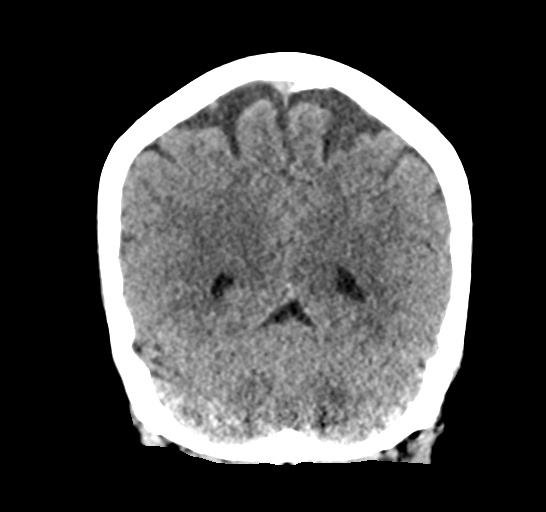
[im 30/67  brain]
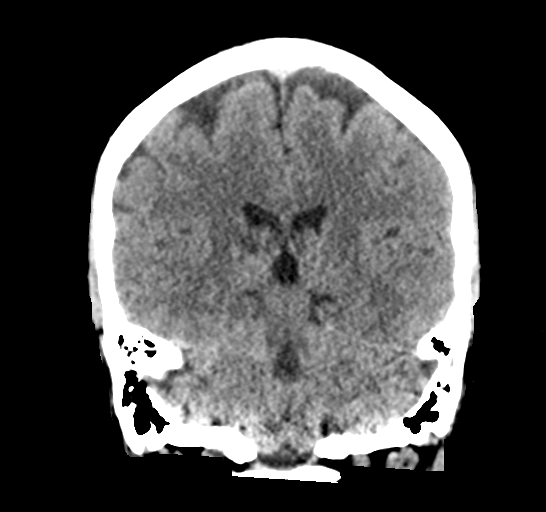
[im 37/67  brain]
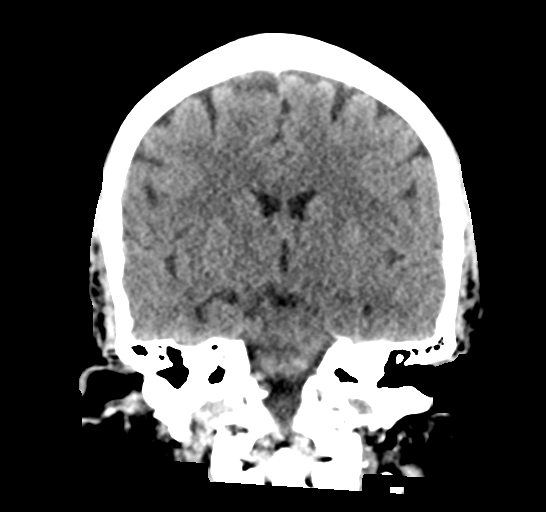

[Series 6: head without sag · sagittal · non-contrast · 0.35mm/px · 3 of 54 slices shown]
[im 18/54  brain]
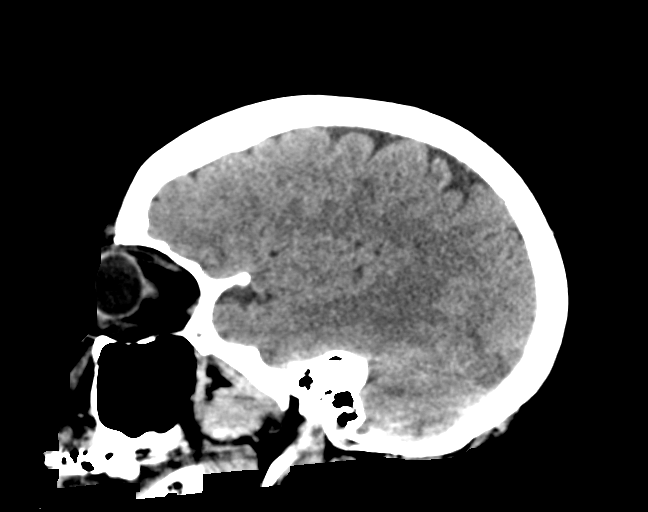
[im 27/54  brain]
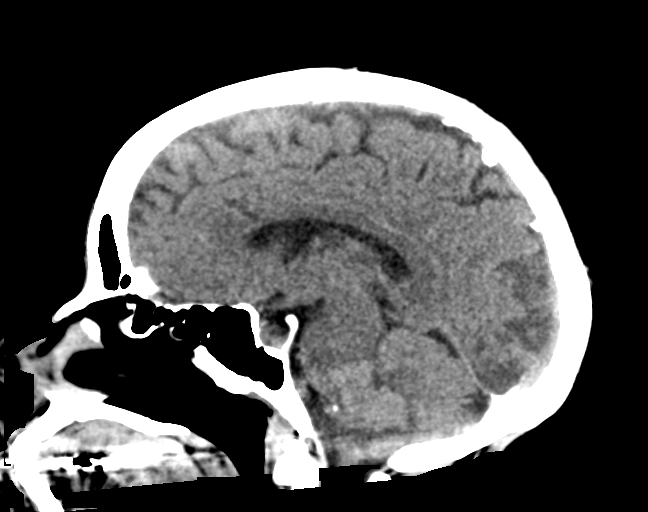
[im 36/54  brain]
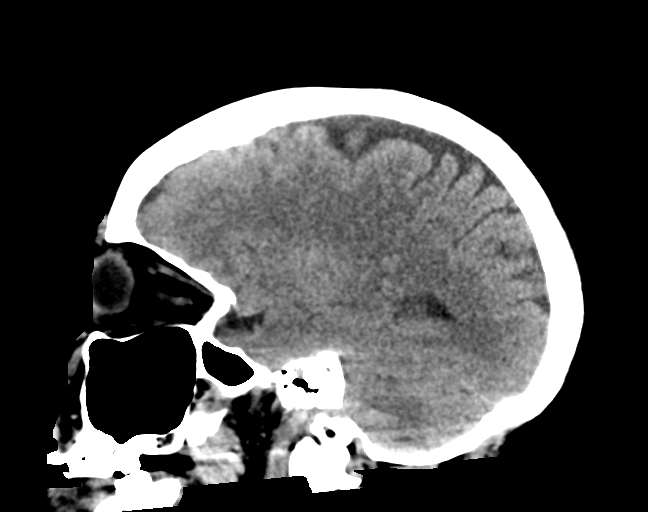

[16 of 47 positions shown; findings below may reference images not displayed]

FINDINGS: Brain: Acute right PCA territory infarcts, better characterized on
concurrent MRI. Probable mild petechial hemorrhage. No
hydrocephalus, midline shift, mass lesion, or visible extra-axial
fluid collection.

Vascular: No hyperdense vessel identified. Calcific intracranial
atherosclerosis.

Skull: No acute fracture.

Sinuses/Orbits: Clear sinuses.  No acute orbital findings.

Other: No mastoid effusions.
IMPRESSION: Acute right PCA territory infarcts, better characterized on
concurrent MRI. Probable mild petechial hemorrhage.

These results will be called to the ordering clinician or
representative by the Radiologist Assistant, and communication
documented in the PACS or [REDACTED].

## 2022-02-03 IMAGING — MR MR HEAD WO/W CM
6 of 12 series · 26 of 48 positions shown · IV contrast (Yes GAD)
Comparison: [DATE]

CLINICAL DATA: Dizziness, persistent/recurrent, cardiac or vascular
cause suspected; headache, new or worsening (Age >= 50y)

EXAM:
MRI HEAD WITHOUT AND WITH CONTRAST
TECHNIQUE: Multiplanar, multiecho pulse sequences of the brain and surrounding
structures were obtained without and with intravenous contrast.
CONTRAST:  8mL GADAVIST GADOBUTROL 1 MMOL/ML IV SOLN

[Series 2: DWI · axial · 3.0mm · 0.94mm/px · z∈[-49,+86]mm · 8 of 92 slices shown (1 of 2)]
[im 1/92]
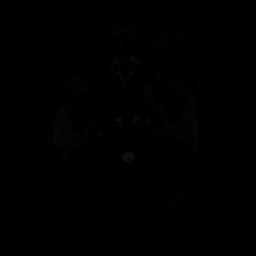
[im 14/92]
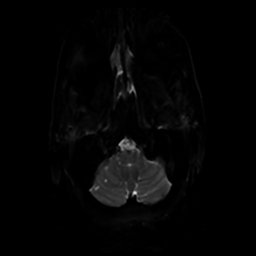
[im 27/92]
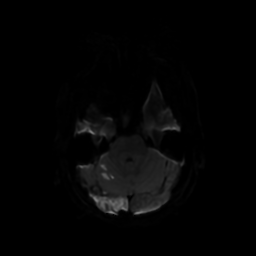
[im 40/92]
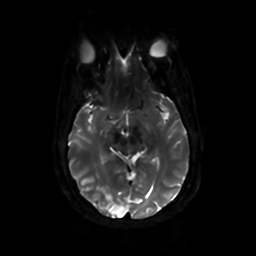
[im 53/92]
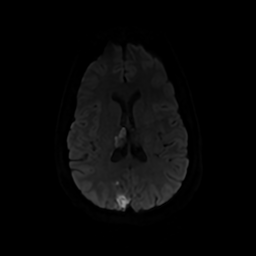
[im 66/92]
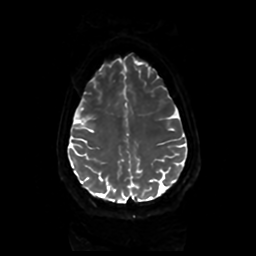
[im 79/92]
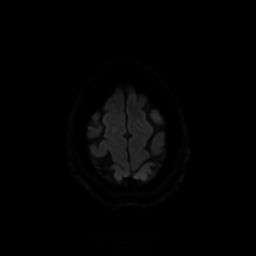
[im 92/92]
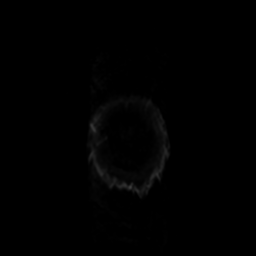

[Series 3: DWI · coronal · 4.0mm · 0.94mm/px · 6 of 69 slices shown (2 of 2)]
[im 1/69]
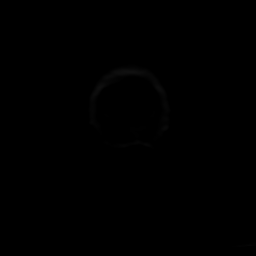
[im 14/69]
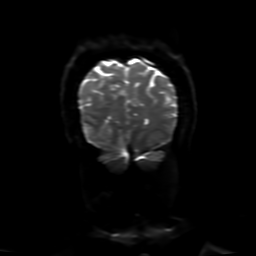
[im 28/69]
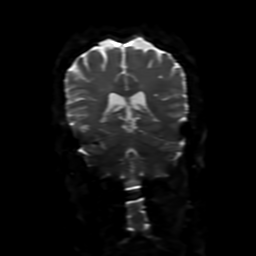
[im 41/69]
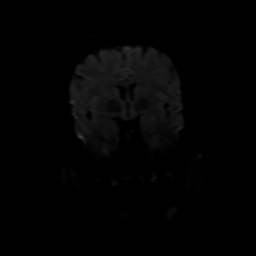
[im 55/69]
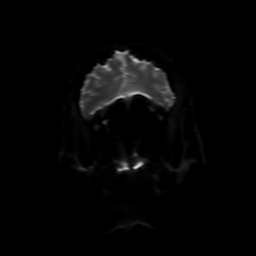
[im 69/69]
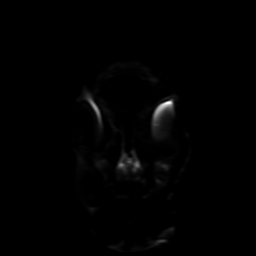

[Series 4: FLAIR · sagittal · 5.0mm · 0.23mm/px · 2 of 22 slices shown (1 of 2)]
[im 1/22]
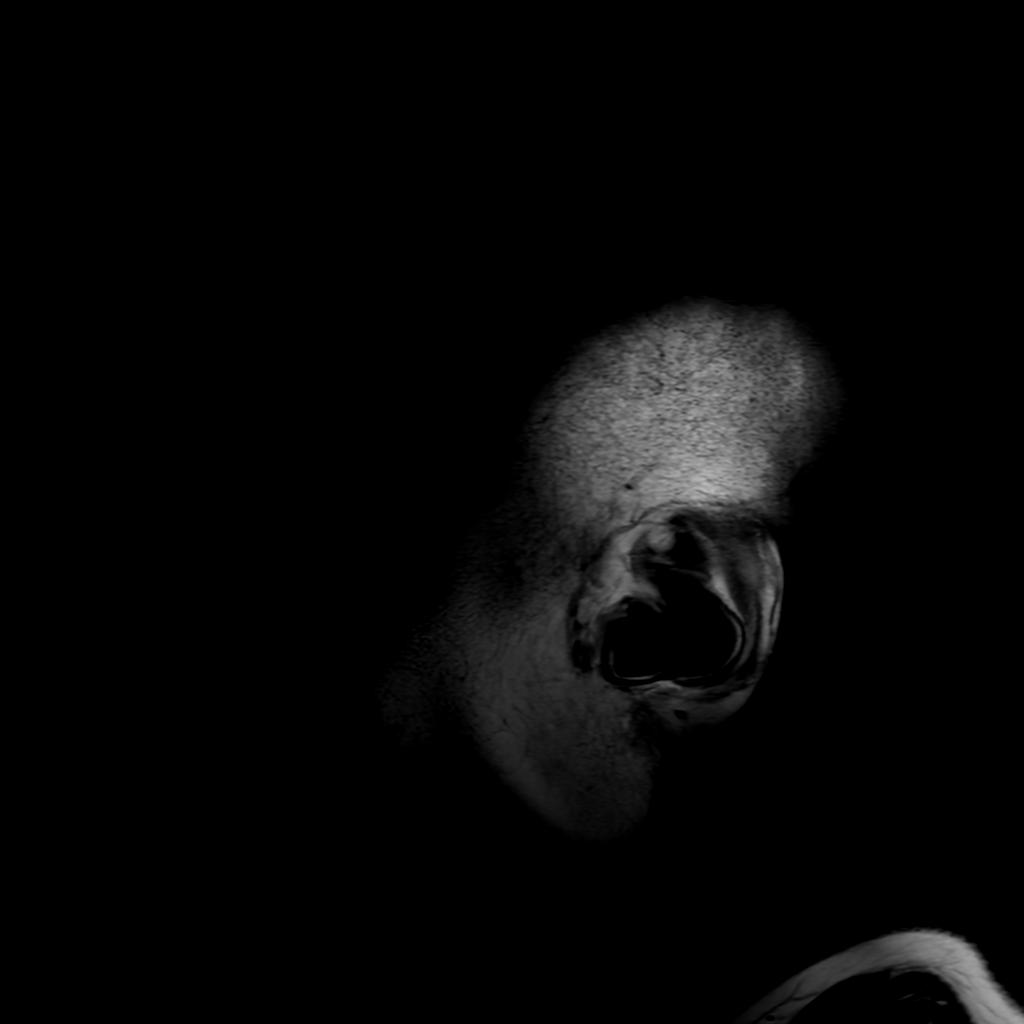
[im 22/22]
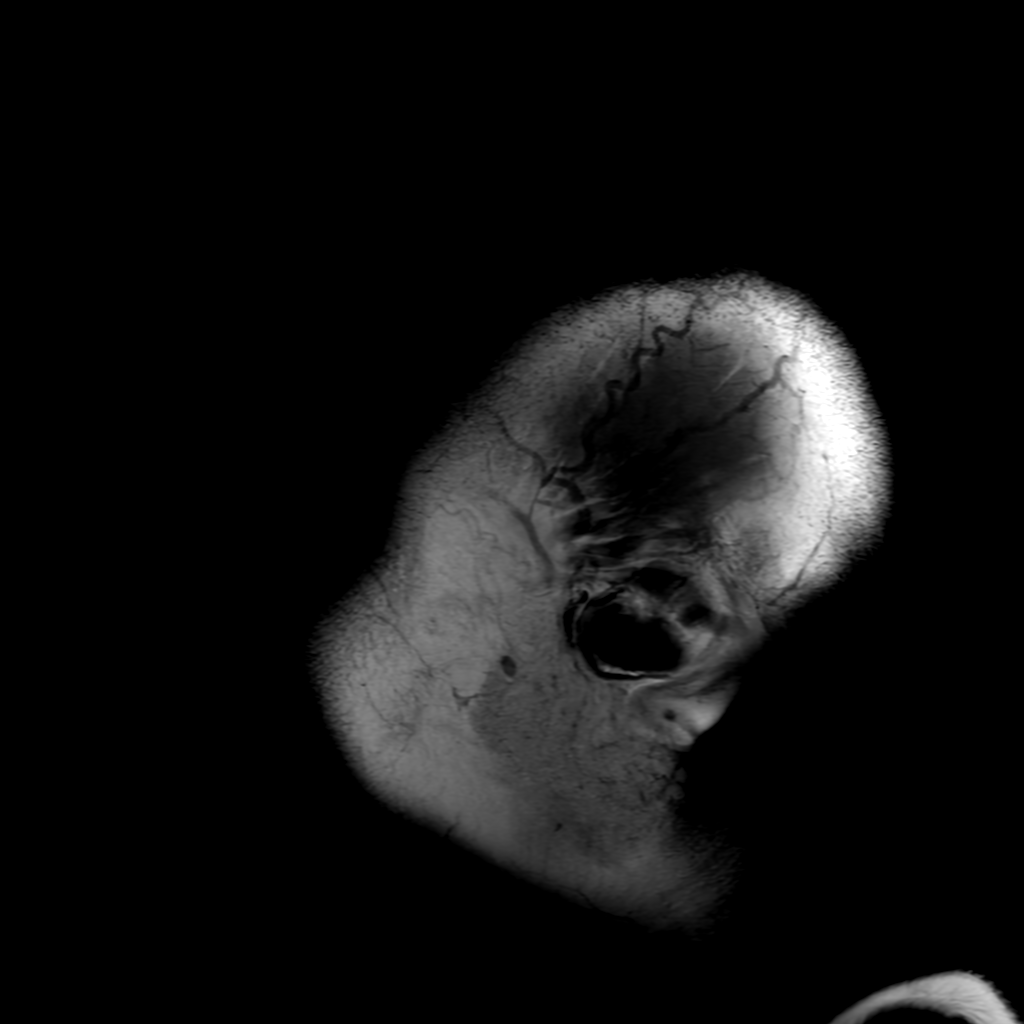

[Series 6: FLAIR · axial · 4.0mm · 0.45mm/px · z∈[-47,+84]mm · 3 of 31 slices shown (2 of 2)]
[im 1/31]
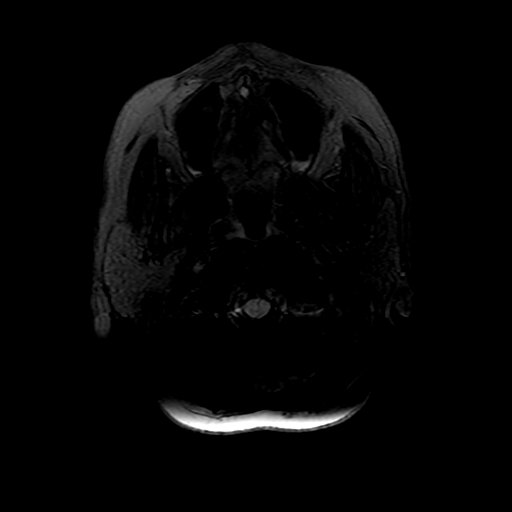
[im 16/31]
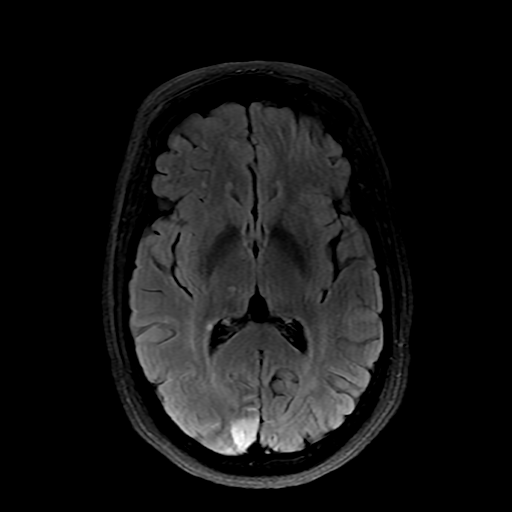
[im 31/31]
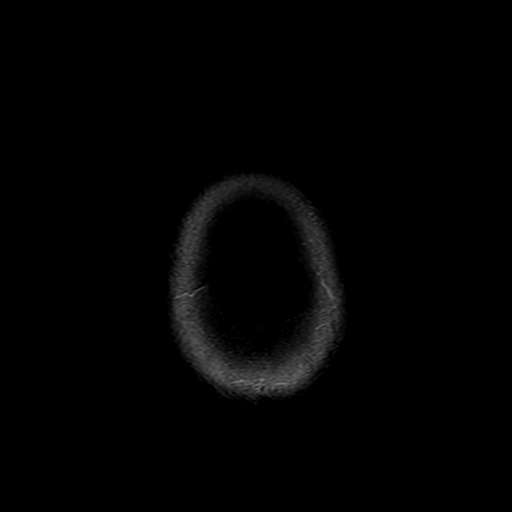

[Series 250: ADC · axial · 3.0mm · 0.94mm/px · z∈[-49,+86]mm · 4 of 45 slices shown (1 of 2)]
[im 1/45]
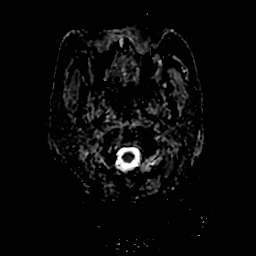
[im 15/45]
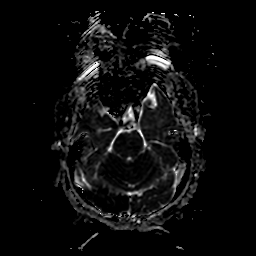
[im 30/45]
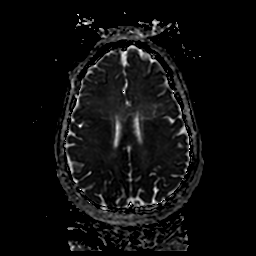
[im 45/45]
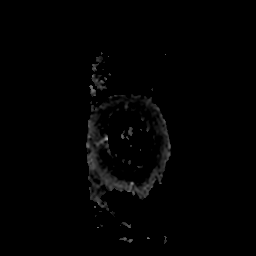

[Series 350: ADC · coronal · 4.0mm · 0.94mm/px · 3 of 34 slices shown (2 of 2)]
[im 1/34]
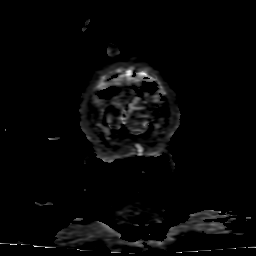
[im 17/34]
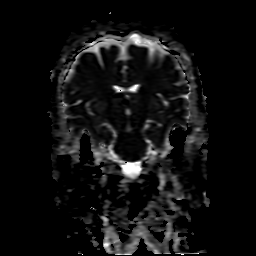
[im 34/34]
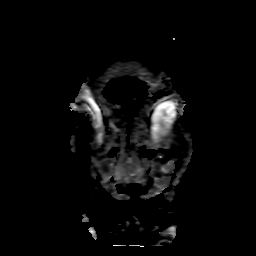

[26 of 48 positions shown; findings below may reference images not displayed]

FINDINGS: Brain: Restricted diffusion is present in the right occipital lobe,
posterior temporal lobe, thalamus, inferior parasagittal parietal
lobe, right hippocampal head, and right cerebellum. There is also
involvement of the left cerebellum. There is minimal susceptibility
in the right occipital lobe that may reflect petechial hemorrhage.
Enhancement is present in the right parieto-occipital region.

Ventricles and sulci are normal in size and configuration.
Additional patchy T2 hyperintensity in the supratentorial white
matter is nonspecific but may reflect minor chronic microvascular
ischemic changes. There is no intracranial mass or significant mass
effect.

Vascular: Major vessel flow voids at the skull base are preserved.

Skull and upper cervical spine: Normal marrow signal is preserved.

Sinuses/Orbits: Paranasal sinuses are aerated. Orbits are
unremarkable.

Other: Sella is unremarkable.  Mastoid air cells are clear.
IMPRESSION: Acute primarily right-sided and posterior circulation infarcts as
detailed above. Suspect mild petechial hemorrhage in the right
occipital lobe. No significant mass effect. There are also small,
slightly more subacute components in the right parieto-occipital
region.

These results will be called to the ordering clinician or
representative by the Radiologist Assistant, and communication
documented in the PACS or [REDACTED].

## 2022-02-03 MED ORDER — STROKE: EARLY STAGES OF RECOVERY BOOK
Freq: Once | Status: AC
  Administered 2022-02-03: 1
  Filled 2022-02-03: qty 1

## 2022-02-03 MED ORDER — ACETAMINOPHEN 650 MG RE SUPP
650.0000 mg | Freq: Four times a day (QID) | RECTAL | Status: DC | PRN
Start: 2022-02-03 — End: 2022-02-04

## 2022-02-03 MED ORDER — ONDANSETRON HCL 4 MG PO TABS: 4.0000 mg | ORAL_TABLET | Freq: Four times a day (QID) | ORAL | Status: DC | PRN

## 2022-02-03 MED ORDER — GADOBUTROL 1 MMOL/ML IV SOLN
8.0000 mL | Freq: Once | INTRAVENOUS | Status: AC | PRN
  Administered 2022-02-03: 8 mL via INTRAVENOUS

## 2022-02-03 MED ORDER — LACTATED RINGERS IV BOLUS
1000.0000 mL | Freq: Once | INTRAVENOUS | Status: AC
  Administered 2022-02-03: 1000 mL via INTRAVENOUS

## 2022-02-03 MED ORDER — EZETIMIBE 10 MG PO TABS
10.0000 mg | ORAL_TABLET | Freq: Every day | ORAL | Status: DC
  Administered 2022-02-04: 10 mg via ORAL
  Filled 2022-02-03: qty 1

## 2022-02-03 MED ORDER — ENOXAPARIN SODIUM 40 MG/0.4ML IJ SOSY
40.0000 mg | PREFILLED_SYRINGE | INTRAMUSCULAR | Status: DC
  Filled 2022-02-03: qty 0.4

## 2022-02-03 MED ORDER — ONDANSETRON HCL 4 MG/2ML IJ SOLN: 4.0000 mg | Freq: Four times a day (QID) | INTRAMUSCULAR | Status: DC | PRN

## 2022-02-03 MED ORDER — ASPIRIN EC 81 MG PO TBEC: 81.0000 mg | DELAYED_RELEASE_TABLET | Freq: Every day | ORAL | Status: DC

## 2022-02-03 MED ORDER — ACETAMINOPHEN 325 MG PO TABS
650.0000 mg | ORAL_TABLET | Freq: Four times a day (QID) | ORAL | Status: DC | PRN
  Administered 2022-02-03: 650 mg via ORAL
  Filled 2022-02-03: qty 2

## 2022-02-03 MED ORDER — IOHEXOL 350 MG/ML SOLN
75.0000 mL | Freq: Once | INTRAVENOUS | Status: AC | PRN
  Administered 2022-02-03: 75 mL via INTRAVENOUS

## 2022-02-03 MED ORDER — ENOXAPARIN SODIUM 40 MG/0.4ML IJ SOSY
40.0000 mg | PREFILLED_SYRINGE | INTRAMUSCULAR | Status: DC
  Administered 2022-02-03: 40 mg via SUBCUTANEOUS

## 2022-02-03 MED ORDER — ATORVASTATIN CALCIUM 80 MG PO TABS
80.0000 mg | ORAL_TABLET | Freq: Every day | ORAL | Status: DC
  Administered 2022-02-04: 80 mg via ORAL
  Filled 2022-02-03: qty 1

## 2022-02-03 MED ORDER — METFORMIN HCL 500 MG PO TABS
1000.0000 mg | ORAL_TABLET | Freq: Two times a day (BID) | ORAL | Status: DC
  Administered 2022-02-04: 1000 mg via ORAL
  Filled 2022-02-03: qty 2

## 2022-02-03 MED ORDER — INSULIN ASPART 100 UNIT/ML IJ SOLN
0.0000 [IU] | Freq: Three times a day (TID) | INTRAMUSCULAR | Status: DC
  Administered 2022-02-04: 3 [IU] via SUBCUTANEOUS
  Administered 2022-02-04: 5 [IU] via SUBCUTANEOUS

## 2022-02-03 MED ORDER — SENNOSIDES-DOCUSATE SODIUM 8.6-50 MG PO TABS: 1.0000 | ORAL_TABLET | Freq: Every evening | ORAL | Status: DC | PRN

## 2022-02-03 NOTE — Progress Notes (Signed)
? ?  CC: Right posterior headache, blurred vision in left eye, and dizziness ? ?HPI:Cheyenne Valenzuela is a 66 y.o. female who presents for evaluation of right posterior headache, blurred vision in left eye, and dizziness. Please see individual problem based A/P for details. ? ?Past Medical History:  ?Diagnosis Date  ? Cough 06/24/2021  ? Hypercholesteremia   ? Hypertension   ? Influenza A 11/21/2021  ? ?Review of Systems:   ?Review of Systems  ?Constitutional:  Negative for chills and fever.  ?Eyes:  Positive for blurred vision (left eye). Negative for double vision.  ?Neurological:  Positive for dizziness (described as objects moving) and headaches (right sided).   ? ?Physical Exam: ?Vitals:  ? 02/03/22 1031 02/03/22 1038  ?BP: (!) 168/81 (!) 152/79  ?Pulse: 94 94  ?Resp: (!) 28   ?Temp: 99.2 ?F (37.3 ?C)   ?TempSrc: Oral   ?SpO2: 100%   ?Weight: 170 lb 12.8 oz (77.5 kg)   ?Height: 5\' 3"  (1.6 m)   ? ? ? ?Physical Exam ?Constitutional:   ?   General: She is not in acute distress. ?   Appearance: She is not toxic-appearing.  ?HENT:  ?   Right Ear: Hearing and external ear normal. There is no impacted cerumen. Tympanic membrane is not erythematous or bulging.  ?   Left Ear: Hearing and external ear normal. There is no impacted cerumen. Tympanic membrane is not erythematous or bulging.  ?Cardiovascular:  ?   Rate and Rhythm: Normal rate and regular rhythm.  ? ?Neuro: Alert&oriented x3, no focal weakness or change in sensation. 20/40 vision in left eye and 20/20 right eye with bedside vision card, finger to nose testing patient overshoots with left hand ( notes it seems like doctors finger moves) Hints exam, vestibulocular reflex preserved, direction changing nystagmus, absent skew.Patient maintains balance with romberg but feeling of falling forward. Gait nl, but patients right posterior headache worsened with ambulation.  ? ?Assessment & Plan:  ? 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 This began on Tuesday and patient had appointment for chronic medical conditions this morning. Her daughter drove her to visit because of her dizziness. Hints exam vestibulocular reflex preserved and direction changing nystagmus. Skew was absent, but other finding and symptoms consistent with possible right posterior stroke. Patient was sent to radiology department in [place] for STAT CT Head which suggested acute R. PCA stroke. It was followed by MRI which shows acute right sided posterior stroke. Additional finding of mild petechial hemorrhage in the right occipital lobe without mass effect. There are also small subacute components in right parieto occipital region.  ? I spoke to Dr.Sethi briefly in ED and reviewed image with him. Patient is out of TPA window and stroke team can be consulted on admission. Appreciate radiology technician who will take patient up to ED to be admitted. Patient will be admitted to Internal Medicine Teaching Service for stroke workup. I discussed finding with patient and her daughter after MRI.  ? ?Plan ?Admit to hospital for stroke workup. Outside of TPA window.  ? ? ? ?Patient discussed with Dr. Tuesday ? ?

## 2022-02-03 NOTE — ED Notes (Signed)
Neuro provider at bedside  

## 2022-02-03 NOTE — ED Notes (Signed)
Received verbal report from Katrina Y RN 

## 2022-02-03 NOTE — ED Triage Notes (Signed)
The pt is c/o a headache with dizziness since Wednesday and some nausea.   ?

## 2022-02-03 NOTE — ED Notes (Signed)
MD notified of pt bp. Alert and oriented x 4. Per MD, pt is allowed permissive hypertension at this time. Will continue to monitor.  ?

## 2022-02-03 NOTE — Assessment & Plan Note (Addendum)
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 This began on Tuesday and patient had appointment for chronic medical conditions this morning. Her daughter drove her to visit because of her dizziness. Hints exam vestibulocular reflex preserved and direction changing nystagmus. Skew was absent, but other finding and symptoms consistent with possible right posterior stroke. Patient was sent to radiology department in [place] for STAT CT Head which suggested acute R. PCA stroke. It was followed by MRI which shows acute right sided posterior stroke. Additional finding of mild petechial hemorrhage in the right occipital lobe without mass effect. There are also small subacute components in right parieto occipital region.  ? I spoke to Dr.Sethi briefly in ED and reviewed image with him. Patient is out of TPA window and stroke team can be consulted on admission. Appreciate radiology technician who will take patient up to ED to be admitted. Patient will be admitted to Internal Medicine Teaching Service for stroke workup. I discussed finding with patient and her daughter after MRI.  ? ?Plan ?Admit to hospital through ED for stroke workup. Outside of TPA window.  ?

## 2022-02-03 NOTE — H&P (Signed)
? ? ? ?Date: 02/03/2022     ?     ?     ?Patient Name:  Cheyenne Valenzuela MRN: [id number]  ?DOB: 09-08-1956 Age / Sex: 66 y.o., female   ?PCP: Albertha Ghee, MD    ?     ?Medical Service: Internal Medicine Teaching Service    ?     ?Attending Physician: Dr. Reymundo Poll, MD    ?First Contact: Dr. Burnice Logan Pager: [phone]  ?Second Contact: Dr. Marijo Conception Pager: [phone]  ?     ?After Hours (After 5p/  First Contact Pager: [phone]  ?weekends / holidays): Second Contact Pager: [phone]  ? ?Chief Complaint: right posterior headache, blurry vision, and dizziness ? ?History of Present Illness: Cheyenne Valenzuela is a 66 y.o. female with a past medical history of hypertension, hyperlipidemia, intracranial atherosclerosis in the left posterior circulation found on presentation last year of dizziness, and type 2 diabetes who presents here today as a direct admit from the clinic for a chief complaint of right posterior headache, blurry vision in left eye, and dizziness. ? ?The patient states that, about 3 days ago, she was standing in the kitchen and had a sudden sharp pain in her left eye with subsequent development of blurry vision.  She denies any issues with the right eye.  She also endorses having dizziness upon standing.  Other complaints include right occipital headache, which she describes as dull and intermittent.  Patient says that she has been taking her cholesterol and blood pressure medications as prescribed at home.  She denies any numbness or weakness today.  Denies abdominal pain, nausea, vomiting, dysuria, bowel incontinence, bladder incontinence.  Of note, she was admitted to the hospital last year for vertigo, which she says has resolved now.  No other complaints or concerns today. ? ?Meds:  ?Current Meds  ?Medication Sig  ? albuterol (VENTOLIN HFA) 108 (90 Base) MCG/ACT inhaler Inhale 2 puffs into the lungs every 4 (four) hours as needed for shortness of breath.  ? amLODipine (NORVASC) 10 MG tablet Take 1 tablet (10 mg  total) by mouth daily.  ? atorvastatin (LIPITOR) 80 MG tablet Take 1 tablet (80 mg total) by mouth daily.  ? ezetimibe (ZETIA) 10 MG tablet Take 1 tablet (10 mg total) by mouth daily.  ? ibuprofen (ADVIL) 200 MG tablet Take 200 mg by mouth every 6 (six) hours as needed for mild pain.  ? losartan-hydrochlorothiazide (HYZAAR) 100-25 MG tablet Take 1 tablet by mouth daily.  ? metFORMIN (GLUCOPHAGE) 1000 MG tablet Take 1 tablet (1,000 mg total) by mouth 2 (two) times daily with a meal.  ? ?Allergies: ?Allergies as of 02/03/2022 - Review Complete 02/03/2022  ?Allergen Reaction Noted  ? Lisinopril Swelling 06/13/2014  ? ?Past Medical History:  ?Diagnosis Date  ? Cough 06/24/2021  ? Hypercholesteremia   ? Hypertension   ? Influenza A 11/21/2021  ? ?Family history: Diabetes in mother. Niece with breast cancer.  ? ?Social history: Lives in [place].  Able to perform her ADLs and IADLs without issue.  Has not needed any assistance with ambulation since her symptoms started.  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  Denies any tobacco, alcohol, or other drug use.  ? ?Review of Systems: ?A complete ROS was negative except as per HPI.  ? ?Physical Exam: ?Blood pressure (!) 184/83, pulse 89, temperature 98.3 ?F (36.8 ?C), temperature source Oral, resp. rate 18, height 5\' 3"  (1.6 m), weight 77.1 kg, SpO2 98 %. ?General:  NAD, nl appearance ?HE: Normocephalic, atraumatic, EOMI, Conjunctivae normal ?ENT: No congestion, no rhinorrhea, no exudate or erythema  ?Cardiovascular: Normal rate, regular rhythm. No murmurs, rubs, or gallops ?Pulmonary: Effort normal, breath sounds normal. No wheezes, rales, or rhonchi ?Abdominal: Soft, nontender, bowel sounds present ?Musculoskeletal: No swelling, deformity, injury or tenderness in extremities ?Skin: Warm, dry, No bruising, erythema, or rash ?Psychiatric/Behavioral: normal mood, normal behavior  ?Neurologic exam: ?Mental status: A&Ox3 ?Cranial Nerves: ?            II: PERRL ?             III, IV, VI: Unable to depress both eyes while looking to the left, but otherwise EOMI ?            V, VII: Face symmetric, sensation intact in all 3 divisions   ?            IX, X: Palate rises symmetrically ?            XI: Head turn and shoulder shrug normal bilaterally   ?            XII: Tongue is midline    ?Motor: Strength 5/5 on all upper and lower extremities, bulk muscle and tone are normal ?Sensory: Light touch intact and symmetric bilaterally  ?Coordination: Overshoots with her hands on finger-to-nose testing, L>R. Heel to shin test normal. ?Psychiatric: Normal mood and affect ?   ?EKG: personally reviewed my interpretation is NSR with LVH ? ?CT Head w/o Contrast: ?Acute right PCA territory infarcts, better characterized on ?concurrent MRI. Probable mild petechial hemorrhage. ? ?MRI Brain W/WO Contrast ?Acute primarily right-sided and posterior circulation infarcts as ?detailed above. Suspect mild petechial hemorrhage in the right ?occipital lobe. No significant mass effect. There are also small, ?slightly more subacute components in the right parieto-occipital ?region. ? ? ?Assessment & Plan by Problem: ?Principal Problem: ?  Acute ischemic right PCA stroke (HCC) ? ?Acute ischemic stroke of the right PCA ?Poorly-controlled HTN ?HLD ?The patient presents here from our clinic with a 3-day history of left eye blurriness, dizziness, and a right posterior headache.  Head imaging was obtained as part of work-up, which showed an ischemic stroke of the right PCA territory with a mild petechial hemorrhage of the right occipital lobe. Etiology of her stroke is likely poorly-controlled hypertension and hyperlipidemia, but we will otherwise continue stroke work-up as below. ?-Stroke team has been consulted, appreciate recommendations ?-ASA 81 mg, holding off on DAPT in the setting of petechial hemorrhage. ?-Echocardiogram pending ?-Permissive hypertension to 220/120 for 24 to 48 hours ?-Telemetry ?-Frequent  neurochecks ?-Continue atorvastatin and Zetia regimens ?-PT/OT evals ? ?Type 2 diabetes ?Patient is on metformin 1000 mg twice daily at home.  Most recent A1c of 6.5 last month. ?-Continue metformin regimen ?-SSI with meals ? ?Dispo: Admit patient to Observation with expected length of stay less than 2 midnights. ? ?Signed: ?Andrey Campanile, MD ?02/03/2022, 9:23 PM  ?Pager: [phone]  ?After 5pm on weekdays and 1pm on weekends: On Call pager: [phone] ? ?

## 2022-02-03 NOTE — Patient Instructions (Signed)
Thank you for trusting me with your care. To recap, today we discussed the following: ? ? ?I am concerned about your new headache , dizziness , and blurred vision. We are sending you for CT Scan and then MRI to rule out a stroke. I will follow up today as the results come back. ? ?

## 2022-02-03 NOTE — ED Notes (Signed)
Ambulated to restroom without assistance 

## 2022-02-03 NOTE — ED Provider Triage Note (Signed)
Emergency Medicine Provider Triage Evaluation Note ? ?Cheyenne Valenzuela , a 66 y.o. female  was evaluated in triage.  Pt complains of headache.  Started about 3 days ago.  She said it was a gradual onset that is gradually worsened.  She has associated blurry vision.  She says it for started behind her left eye but is now present on the right temporal region of her head.  She has never had a headache like this in the past.  She has had some worsening balance issues as well.  She does have occasional dizziness associated with this.  Denies any numbness, weakness, speech changes.  Is not on anticoagulation.  She was seen for this by another provider today and was sent for CT imaging and MRI.  This was done today.  It unfortunately resulted with concerning findings for acute right-sided and posterior circulation infarcts as well as mild petechial hemorrhage on the right occipital lobe.  She was sent here for likely admission to the hospital and stroke work-up. ? ?Review of Systems  ?Positive: Headache, dizziness, balance issues ?Negative:  ? ?Physical Exam  ?BP (!) 172/86 (BP Location: Left Arm)   Pulse 91   Temp 98.7 ?F (37.1 ?C) (Oral)   Resp 16   Ht 5\' 3"  (1.6 m)   Wt 77.1 kg   SpO2 100%   BMI 30.11 kg/m?  ?Gen:   Awake, no distress   ?Resp:  Normal effort  ?MSK:   Moves extremities without difficulty  ?Other:   ?Alert and Oriented x 3 ?Speech clear with no aphasia ?Cranial Nerve testing ?- Visual Fields grossly intact ?- PERRLA. EOM intact. No Nystagmus ?- Facial Sensation grossly intact ?- No facial asymmetry ?- Uvula and Tongue Midline ?- Accessory Muscles intact ?Motor: ?- 5/5 motor strength in all four extremities.  ? ? ? ? ?Medical Decision Making  ?Medically screening exam initiated at 3:29 PM.  Appropriate orders placed.  Edwina Grossberg was informed that the remainder of the evaluation will be completed by another provider, this initial triage assessment does not replace that evaluation, and the importance of  remaining in the ED until their evaluation is complete. ? ? ?  ?Cheyenne Schmidt, PA-C ?02/03/22 1532 ? ?

## 2022-02-03 NOTE — Consult Note (Addendum)
NEUROLOGY CONSULTATION NOTE  ? ?Date of service: February 03, 2022 ?Patient Name: Cheyenne Valenzuela ?MRN:  [id number] ?DOB:  [birth year] ?Reason for consult: "R PCA strokes" ?Requesting Provider: Velna Ochs, MD ?_ _ _   _ __   _ __ _ _  __ __   _ __   __ _ ? ?History of Present Illness  ?Cheyenne Valenzuela is a 66 y.o. 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  She went to the PCP office and reported right-sided headache along with left-sided vision deficit.  MRI brain with and without contrast was obtained and demonstrated right PCA territory infarcts along with mild petechial hemorrhage in the right occipital lobe. ? ?Patient reports that she woke up at 0830 on 01/31/2022 and went as a baseline.  About an hour after waking up, she noted that her left eye was hurting and she had some blurred vision on the left side.  She did not think too much about it if she was going to see her primary care doctor anyways on Friday.  She try to get in with a eye doctor but unfortunately was only able to get an appointment for July.  She reports that the blurred vision on the left side is waxing and waning and at times is better and at times gets worse. ? ?She does not smoke, no prior personal or family history of strokes.  Endorses history of hypertension.  Reports she is borderline diabetic.  She takes atorvastatin and Zetia for hypercholesterolemia. ? ?LKW: 030 on 01/31/2022. ?mRS: 0 ?tNKASE/Thrombectomy: No intervention was offered to the patient was outside the window for any intervention. ?NIHSS components Score: Comment  ?1a Level of Conscious 0[x]  1[]  2[]  3[]      ?1b LOC Questions 0[x]  1[]  2[]       ?1c LOC Commands 0[x]  1[]  2[]       ?2 Best Gaze 0[x]  1[]  2[]       ?3 Visual 0[]  1[]  2[]  3[]      ?4 Facial Palsy 0[x]  1[]  2[]  3[]      ?5a Motor Arm - left 0[x]  1[]  2[]  3[]  4[]  UN[]    ?5b  Motor Arm - Right 0[x]  1[]  2[]  3[]  4[]  UN[]    ?6a Motor Leg - Left 0[x]  1[]  2[]  3[]  4[]  UN[]    ?6b Motor Leg - Right 0[x]  1[]  2[]  3[]  4[]  UN[]    ?7 Limb Ataxia 0[x]  1[]  2[]  3[]  UN[]     ?8 Sensory 0[x]  1[]  2[]  UN[]      ?9 Best Language 0[x]  1[]  2[]  3[]      ?10 Dysarthria 0[x]  1[]  2[]  UN[]      ?11 Extinct. and Inattention 0[x]  1[]  2[]       ?TOTAL: 0   ?  ?ROS  ? ?Constitutional Denies weight loss, fever and chills.   ?HEENT + changes in vision but not with hearing.   ?Respiratory Denies SOB and cough.   ?CV Denies palpitations and CP   ?GI Denies abdominal pain, nausea, vomiting and diarrhea.   ?GU Denies dysuria and urinary frequency.   ?MSK Denies myalgia and joint pain.   ?Skin Denies rash and pruritus.   ?Neurological Denies headache and syncope.   ?Psychiatric Denies recent changes in mood. Denies anxiety and depression.   ? ?Past History  ? ?Past Medical History:  ?Diagnosis Date  ? Cough 06/24/2021  ? Hypercholesteremia   ? Hypertension   ? Influenza A 11/21/2021  ? ?  Past Surgical History:  ?Procedure Laterality Date  ? ABDOMINAL HYSTERECTOMY    ? APPENDECTOMY    ? ?Family History  ?Problem Relation Age of Onset  ? Diabetes Sister   ? ?Social History  ? ?Socioeconomic History  ? Marital status: Single  ?  Spouse name: Not on file  ? Number of children: Not on file  ? Years of education: Not on file  ? Highest education level: Not on file  ?Occupational History  ? Occupation: Retired  ?Tobacco Use  ? Smoking status: Never  ?  Passive exposure: Never  ? Smokeless tobacco: Never  ?Substance and Sexual Activity  ? Alcohol use: No  ? Drug use: Never  ? Sexual activity: Not on file  ?Other Topics Concern  ? Not on file  ?Social History Narrative  ? Not on file  ? ?Social Determinants of Health  ? ?Financial Resource Strain: Not on file  ?Food Insecurity: Not on file  ?Transportation Needs: Not on file  ?Physical Activity: Not on file  ?Stress: Not on file  ?Social Connections: Not on file  ? ?Allergies  ?Allergen  Reactions  ? Lisinopril Swelling  ? ? ?Medications  ?(Not in a hospital admission) ?  ? ?Vitals  ? ?Vitals:  ? 02/03/22 1815 02/03/22 1945 02/03/22 2024 02/03/22 2100  ?BP: (!) 187/83 (!) 184/83  (!) 190/85  ?Pulse: 85 89  (!) 106  ?Resp: 20 18  (!) 26  ?Temp:   98.3 ?F (36.8 ?C)   ?TempSrc:   Oral   ?SpO2: 97% 98%  99%  ?Weight:      ?Height:      ?  ? ?Body mass index is 30.11 kg/m?. ? ?Physical Exam  ? ?General: Laying comfortably in bed; in no acute distress.  ?HENT: Normal oropharynx and mucosa. Normal external appearance of ears and nose.  ?Neck: Supple, no pain or tenderness  ?CV: No JVD. No peripheral edema.  ?Pulmonary: Symmetric Chest rise. Normal respiratory effort.  ?Abdomen: Soft to touch, non-tender.  ?Ext: No cyanosis, edema, or deformity  ?Skin: No rash. Normal palpation of skin.   ?Musculoskeletal: Normal digits and nails by inspection. No clubbing.  ? ?Neurologic Examination  ?Mental status/Cognition: Alert, oriented to self, place, month and year, good attention.  ?Speech/language: Fluent, comprehension intact, object naming intact, repetition intact.  ?Cranial nerves:  ? CN II Pupils equal and reactive to light, left hemianopsia which is worse in the inferior quadrant and in the superior quadrant.  ? CN III,IV,VI EOM intact, no gaze preference or deviation, no nystagmus   ? CN V normal sensation in V1, V2, and V3 segments bilaterally   ? CN VII no asymmetry, no nasolabial fold flattening   ? CN VIII normal hearing to speech   ? CN IX & X normal palatal elevation, no uvular deviation   ? CN XI 5/5 head turn and 5/5 shoulder shrug bilaterally   ? CN XII midline tongue protrusion   ? ?Motor:  ?Muscle bulk: normal, tone normal, pronator drift none tremor none ?Mvmt Root Nerve  Muscle Right Left Comments  ?SA C5/6 Ax Deltoid 5 5   ?EF C5/6 Mc Biceps 5 5   ?EE C6/7/8 Rad Triceps 5 5   ?WF C6/7 Med FCR     ?WE C7/8 PIN ECU     ?F Ab C8/T1 U ADM/FDI 5 5   ?HF L1/2/3 Fem Illopsoas 5 5   ?KE L2/3/4 Fem  Quad 5 5   ?DF L4/5 D  [place] Tib Ant 5 5   ?PF S1/2 Tibial Grc/Sol 5 5   ? ?Reflexes: ? Right Left Comments  ?Pectoralis     ? Biceps (C5/6) 2 2   ?Brachioradialis (C5/6) 2 2   ? Triceps (C6/7) 2 2   ? Patellar (L3/4) 2 2   ? Achilles (S1)     ? Hoffman     ? Plantar     ?Jaw jerk   ? ?Sensation: ? Light touch Intact throughout  ? Pin prick   ? Temperature   ? Vibration   ?Proprioception   ? ?Coordination/Complex Motor:  ?- Finger to Nose intact bilaterally ?- Heel to shin intact bilaterally ?- Rapid alternating movement are normal ?- Gait: Deferred for patient's safety given visual deficit. ?Labs  ? ?CBC:  ?Recent Labs  ?Lab 02/03/22 ?1535  ?WBC 15.6*  ?NEUTROABS 11.1*  ?HGB 12.0  ?HCT 35.7*  ?MCV 85.6  ?PLT 501*  ? ? ?Basic Metabolic Panel:  ?Lab Results  ?Component Value Date  ? NA 138 02/03/2022  ? K 3.5 02/03/2022  ? CO2 27 02/03/2022  ? GLUCOSE 180 (H) 02/03/2022  ? BUN 12 02/03/2022  ? CREATININE 0.87 02/03/2022  ? CALCIUM 9.3 02/03/2022  ? GFRNONAA >60 02/03/2022  ? ?Lipid Panel:  ?Lab Results  ?Component Value Date  ? LDLCALC 149 (H) 01/10/2022  ? ?HgbA1c:  ?Lab Results  ?Component Value Date  ? HGBA1C 6.5 (A) 12/12/2021  ? ?Urine Drug Screen: No results found for: LABOPIA, COCAINSCRNUR, [place], [place], THCU, LABBARB  ?Alcohol Level No results found for: ETH ? ?CT angio Head and Neck with contrast(Personally reviewed): ?pending ? ?MRI Brain(Personally reviewed): ?Acute primarily right-sided and posterior circulation infarcts as ?detailed above. Suspect mild petechial hemorrhage in the right ?occipital lobe. No significant mass effect. There are also small, ?slightly more subacute components in the right parieto-occipital ?region. ? ?Impression  ? ?Araseli Baxter is a 66 y.o. 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  She still reports that her  vision deficit is fluctuating as a time is better but then gets worse. ? ?I suspect that the underlying cause for her presentation is probably due to significant large vessel intracranial atherosclerotic dis

## 2022-02-03 NOTE — ED Notes (Signed)
Pt asking for something to eat and drink provider made aware ?

## 2022-02-03 NOTE — ED Provider Notes (Signed)
?[facility] ?Provider Note ? ?CSN: [id number] ?Arrival date & time: 02/03/22 1428 ? ?Chief Complaint(s) ?Headache and Dizziness ? ?HPI ?Cheyenne Valenzuela is a 66 y.o. female sent from internal medicine clinic for concern of a posterior circulation stroke.  Patient has had persistent dizziness and had a CT scan and MRI concerning for posterior circulation stroke.  She arrives with complaints of mild dizziness but denies chest pain, shortness of breath, abdominal pain, nausea, vomiting, numbness, tingling, weakness or other systemic symptoms. ? ? ?Past Medical History ?Past Medical History:  ?Diagnosis Date  ? Cough 06/24/2021  ? Hypercholesteremia   ? Hypertension   ? Influenza A 11/21/2021  ? ?Patient Active Problem List  ? Diagnosis Date Noted  ? Acute ischemic right posterior cerebral artery (PCA) stroke (HCC) 02/03/2022  ? Acute ischemic right PCA stroke (HCC) 02/03/2022  ? Healthcare maintenance 02/04/2021  ? Diabetes (HCC) 02/04/2021  ? Intracranial atherosclerosis 02/04/2021  ? Vertigo 01/23/2021  ? Hypertension 01/23/2021  ? Hyperlipidemia 01/23/2021  ? Migraine headache 01/23/2021  ? ?Home Medication(s) ?Prior to Admission medications   ?Medication Sig Start Date End Date Taking? Authorizing Provider  ?albuterol (VENTOLIN HFA) 108 (90 Base) MCG/ACT inhaler Inhale 2 puffs into the lungs every 4 (four) hours as needed for shortness of breath. 11/14/21  Yes [provider]  ?amLODipine (NORVASC) 10 MG tablet Take 1 tablet (10 mg total) by mouth daily. 12/12/21 12/12/22 Yes Verdene LennertBasaraba, Iulia, MD  ?atorvastatin (LIPITOR) 80 MG tablet Take 1 tablet (80 mg total) by mouth daily. 12/18/21  Yes Albertha GheeSteen, James, MD  ?ezetimibe (ZETIA) 10 MG tablet Take 1 tablet (10 mg total) by mouth daily. 08/29/21 04/10/22 Yes Andrey CampanileBonanno, Marianne E, MD  ?ibuprofen (ADVIL) 200 MG tablet Take 200 mg by mouth every 6 (six) hours as needed for mild pain.   Yes [provider]   ?losartan-hydrochlorothiazide (HYZAAR) 100-25 MG tablet Take 1 tablet by mouth daily. 12/18/21  Yes Albertha GheeSteen, James, MD  ?metFORMIN (GLUCOPHAGE) 1000 MG tablet Take 1 tablet (1,000 mg total) by mouth 2 (two) times daily with a meal. 12/18/21  Yes Albertha GheeSteen, James, MD  ?                                                                                                                                  ?Past Surgical History ?Past Surgical History:  ?Procedure Laterality Date  ? ABDOMINAL HYSTERECTOMY    ? APPENDECTOMY    ? ?Family History ?Family History  ?Problem Relation Age of Onset  ? Diabetes Sister   ? ? ?Social History ?Social History  ? ?Tobacco Use  ? Smoking status: Never  ?  Passive exposure: Never  ? Smokeless tobacco: Never  ?Substance Use Topics  ? Alcohol use: No  ? Drug use: Never  ? ?Allergies ?Lisinopril ? ?Review of Systems ?Review of Systems  ?Neurological:  Positive for dizziness.  ? ?Physical Exam ?Vital Signs  ?  I have reviewed the triage vital signs ?BP (!) 178/95   Pulse 97   Temp 98.3 ?F (36.8 ?C) (Oral)   Resp 18   Ht 5\' 3"  (1.6 m)   Wt 77.1 kg   SpO2 99%   BMI 30.11 kg/m?  ? ?Physical Exam ?Vitals and nursing note reviewed.  ?Constitutional:   ?   General: She is not in acute distress. ?   Appearance: She is well-developed.  ?HENT:  ?   Head: Normocephalic and atraumatic.  ?Eyes:  ?   Conjunctiva/sclera: Conjunctivae normal.  ?Cardiovascular:  ?   Rate and Rhythm: Normal rate and regular rhythm.  ?   Heart sounds: No murmur heard. ?Pulmonary:  ?   Effort: Pulmonary effort is normal. No respiratory distress.  ?   Breath sounds: Normal breath sounds.  ?Abdominal:  ?   Palpations: Abdomen is soft.  ?   Tenderness: There is no abdominal tenderness.  ?Musculoskeletal:     ?   General: No swelling.  ?   Cervical back: Neck supple.  ?Skin: ?   General: Skin is warm and dry.  ?   Capillary Refill: Capillary refill takes less than 2 seconds.  ?Neurological:  ?   Mental Status: She is alert.  ?   Cranial  Nerves: No cranial nerve deficit.  ?   Sensory: No sensory deficit.  ?   Motor: No weakness.  ?Psychiatric:     ?   Mood and Affect: Mood normal.  ? ? ?ED Results and Treatments ?Labs ?(all labs ordered are listed, but only abnormal results are displayed) ?Labs Reviewed  ?COMPREHENSIVE METABOLIC PANEL - Abnormal; Notable for the following components:  ?    Result Value  ? Glucose, Bld 180 (*)   ? All other components within normal limits  ?CBC - Abnormal; Notable for the following components:  ? WBC 15.6 (*)   ? HCT 35.7 (*)   ? RDW 19.4 (*)   ? Platelets 501 (*)   ? All other components within normal limits  ?DIFFERENTIAL - Abnormal; Notable for the following components:  ? Neutro Abs 11.1 (*)   ? Monocytes Absolute 1.1 (*)   ? All other components within normal limits  ?CBG MONITORING, ED - Abnormal; Notable for the following components:  ? Glucose-Capillary 185 (*)   ? All other components within normal limits  ?PROTIME-INR  ?APTT  ?TSH  ?CBC  ?BASIC METABOLIC PANEL  ?HIV ANTIBODY (ROUTINE TESTING W REFLEX)  ?                                                                                                                       ? ?Radiology ?CT HEAD WO CONTRAST ( ) ? ?Result Date: 02/03/2022 ?CLINICAL DATA:  Dizziness, persistent/recurrent, cardiac or vascular cause suspected EXAM: CT HEAD WITHOUT CONTRAST TECHNIQUE: Contiguous axial images were obtained from the base of the skull through the vertex without intravenous contrast. RADIATION DOSE REDUCTION: This exam was performed according to the  departmental dose-optimization program which includes automated exposure control, adjustment of the mA and/or kV according to patient size and/or use of iterative reconstruction technique. COMPARISON:  Same day MRI head.  CT head January 23, 2021. FINDINGS: Brain: Acute right PCA territory infarcts, better characterized on concurrent MRI. Probable mild petechial hemorrhage. No hydrocephalus, midline shift, mass lesion, or  visible extra-axial fluid collection. Vascular: No hyperdense vessel identified. Calcific intracranial atherosclerosis. Skull: No acute fracture. Sinuses/Orbits: Clear sinuses.  No acute orbital findings. Other: No mastoid effusions. IMPRESSION: Acute right PCA territory infarcts, better characterized on concurrent MRI. Probable mild petechial hemorrhage. These results will be called to the ordering clinician or representative by the Radiologist Assistant, and communication documented in the PACS or [employer]. Electronically Signed   By: Feliberto Harts M.D.   On: 02/03/2022 13:35  ? ?MR Brain W Wo Contrast ? ?Result Date: 02/03/2022 ?CLINICAL DATA:  Dizziness, persistent/recurrent, cardiac or vascular cause suspected; headache, new or worsening (Age >= 50y) EXAM: MRI HEAD WITHOUT AND WITH CONTRAST TECHNIQUE: Multiplanar, multiecho pulse sequences of the brain and surrounding structures were obtained without and with intravenous contrast. CONTRAST:  79mL GADAVIST GADOBUTROL 1 MMOL/ML IV SOLN COMPARISON:  01/23/2021 FINDINGS: Brain: Restricted diffusion is present in the right occipital lobe, posterior temporal lobe, thalamus, inferior parasagittal parietal lobe, right hippocampal head, and right cerebellum. There is also involvement of the left cerebellum. There is minimal susceptibility in the right occipital lobe that may reflect petechial hemorrhage. Enhancement is present in the right parieto-occipital region. Ventricles and sulci are normal in size and configuration. Additional patchy T2 hyperintensity in the supratentorial white matter is nonspecific but may reflect minor chronic microvascular ischemic changes. There is no intracranial mass or significant mass effect. Vascular: Major vessel flow voids at the skull base are preserved. Skull and upper cervical spine: Normal marrow signal is preserved. Sinuses/Orbits: Paranasal sinuses are aerated. Orbits are unremarkable. Other: Sella is unremarkable.   Mastoid air cells are clear. IMPRESSION: Acute primarily right-sided and posterior circulation infarcts as detailed above. Suspect mild petechial hemorrhage in the right occipital lobe. No significant mass effect. There are also small, sligh

## 2022-02-04 ENCOUNTER — Observation Stay (HOSPITAL_BASED_OUTPATIENT_CLINIC_OR_DEPARTMENT_OTHER): Payer: Medicare Other

## 2022-02-04 DIAGNOSIS — I63531 Cerebral infarction due to unspecified occlusion or stenosis of right posterior cerebral artery: Secondary | ICD-10-CM

## 2022-02-04 LAB — CBC
HCT: 31.9 % — ABNORMAL LOW (ref 36.0–46.0)
Hemoglobin: 10.3 g/dL — ABNORMAL LOW (ref 12.0–15.0)
MCH: 27.7 pg (ref 26.0–34.0)
MCHC: 32.3 g/dL (ref 30.0–36.0)
MCV: 85.8 fL (ref 80.0–100.0)
Platelets: 411 10*3/uL — ABNORMAL HIGH (ref 150–400)
RBC: 3.72 MIL/uL — ABNORMAL LOW (ref 3.87–5.11)
RDW: 19.3 % — ABNORMAL HIGH (ref 11.5–15.5)
WBC: 14.9 10*3/uL — ABNORMAL HIGH (ref 4.0–10.5)
nRBC: 0 % (ref 0.0–0.2)

## 2022-02-04 LAB — ECHOCARDIOGRAM COMPLETE BUBBLE STUDY
AR max vel: 1.81 cm2
AV Area VTI: 1.95 cm2
AV Area mean vel: 1.74 cm2
AV Mean grad: 6.5 mmHg
AV Peak grad: 12.6 mmHg
Ao pk vel: 1.78 m/s
Area-P 1/2: 2.95 cm2
Calc EF: 53.5 %
MV VTI: 1.84 cm2
S' Lateral: 2.3 cm
Single Plane A2C EF: 48 %
Single Plane A4C EF: 58.8 %

## 2022-02-04 LAB — HEMOGLOBIN A1C
Hgb A1c MFr Bld: 7.8 % — ABNORMAL HIGH (ref 4.8–5.6)
Mean Plasma Glucose: 177.16 mg/dL

## 2022-02-04 LAB — LIPID PANEL
Cholesterol: 226 mg/dL — ABNORMAL HIGH (ref 0–200)
HDL: 33 mg/dL — ABNORMAL LOW (ref >40)
LDL Cholesterol: 146 mg/dL — ABNORMAL HIGH (ref 0–99)
Total CHOL/HDL Ratio: 6.8 RATIO
Triglycerides: 236 mg/dL — ABNORMAL HIGH (ref <150)
VLDL: 47 mg/dL — ABNORMAL HIGH (ref 0–40)

## 2022-02-04 LAB — GLUCOSE, CAPILLARY
Glucose-Capillary: 193 mg/dL — ABNORMAL HIGH (ref 70–99)
Glucose-Capillary: 203 mg/dL — ABNORMAL HIGH (ref 70–99)

## 2022-02-04 LAB — BASIC METABOLIC PANEL
Anion gap: 10 (ref 5–15)
BUN: 9 mg/dL (ref 8–23)
CO2: 24 mmol/L (ref 22–32)
Calcium: 8.8 mg/dL — ABNORMAL LOW (ref 8.9–10.3)
Chloride: 105 mmol/L (ref 98–111)
Creatinine, Ser: 0.84 mg/dL (ref 0.44–1.00)
GFR, Estimated: >60 mL/min (ref >60)
Glucose, Bld: 161 mg/dL — ABNORMAL HIGH (ref 70–99)
Potassium: 3.6 mmol/L (ref 3.5–5.1)
Sodium: 139 mmol/L (ref 135–145)

## 2022-02-04 LAB — TSH: TSH: 1.276 u[IU]/mL (ref 0.350–4.500)

## 2022-02-04 LAB — HIV ANTIBODY (ROUTINE TESTING W REFLEX): HIV Screen 4th Generation wRfx: NONREACTIVE

## 2022-02-04 MED ORDER — CLOPIDOGREL BISULFATE 75 MG PO TABS
75.0000 mg | ORAL_TABLET | Freq: Every day | ORAL | 0 refills | Status: AC
  Filled 2022-02-04: qty 30, 30d supply, fill #0

## 2022-02-04 MED ORDER — ASPIRIN EC 325 MG PO TBEC
325.0000 mg | DELAYED_RELEASE_TABLET | Freq: Every day | ORAL | Status: DC
  Administered 2022-02-04: 325 mg via ORAL
  Filled 2022-02-04: qty 1

## 2022-02-04 MED ORDER — CLOPIDOGREL BISULFATE 75 MG PO TABS
75.0000 mg | ORAL_TABLET | Freq: Every day | ORAL | Status: DC
  Administered 2022-02-04: 75 mg via ORAL
  Filled 2022-02-04: qty 1

## 2022-02-04 MED ORDER — ASPIRIN 325 MG PO TBEC
325.0000 mg | DELAYED_RELEASE_TABLET | Freq: Every day | ORAL | 0 refills | Status: DC
  Filled 2022-02-04 – 2022-03-16 (×2): qty 30, 30d supply, fill #0

## 2022-02-04 NOTE — Evaluation (Signed)
Physical Therapy Evaluation ?Patient Details ?Name: Cheyenne Valenzuela ?MRN: [id number] ?DOB: 1955-12-29 ?Today's Date: 02/04/2022 ? ?History of Present Illness ? Pt is a 66 y/o female admitted secondary to L vision deficits and dizziness. Found to have R PCA infarct. PMH includes HTN.  ?Clinical Impression ? Pt admitted secondary to problem above with deficits below. Requiring min guard A for mobility tasks this session. Pt with L visual deficits and tended to drift to the L. Also ran into objects on the L. Educated about importance of visual scanning. Reports family can assist as needed with mobility tasks at d/c. Will continue to follow acutely.    ?   ? ?Recommendations for follow up therapy are one component of a multi-disciplinary discharge planning process, led by the attending physician.  Recommendations may be updated based on patient status, additional functional criteria and insurance authorization. ? ?Follow Up Recommendations Outpatient PT ? ?  ?Assistance Recommended at Discharge Intermittent Supervision/Assistance  ?Patient can return home with the following ? Assist for transportation;Help with stairs or ramp for entrance;Direct supervision/assist for medications management;Direct supervision/assist for financial management;Assistance with cooking/housework ? ?  ?Equipment Recommendations None recommended by PT  ?Recommendations for Other Services ?    ?  ?Functional Status Assessment Patient has had a recent decline in their functional status and demonstrates the ability to make significant improvements in function in a reasonable and predictable amount of time.  ? ?  ?Precautions / Restrictions Precautions ?Precautions: Fall ?Restrictions ?Weight Bearing Restrictions: No  ? ?  ? ?Mobility ? Bed Mobility ?Overal bed mobility: Independent ?  ?  ?  ?  ?  ?  ?  ?  ? ?Transfers ?Overall transfer level: Needs assistance ?Equipment used: None ?Transfers: Sit to/from Stand ?Sit to Stand: Supervision ?  ?  ?  ?  ?   ?General transfer comment: Supervision for safety. ?  ? ?Ambulation/Gait ?Ambulation/Gait assistance: Supervision, Min guard ?Gait Distance (Feet): 150 Feet ?Assistive device: None ?Gait Pattern/deviations: Step-through pattern, Decreased stride length, Drifts right/left ?Gait velocity: Decreased ?  ?  ?General Gait Details: Tended to drift to the L and ran into objects on the L. Was able to perform DGI tasks with mild unsteadiness. Educated about importance of visually scanning to the L to ensure safety with mobility. ? ?Stairs ?Stairs: Yes ?Stairs assistance: Supervision, Min guard ?Stair Management: One rail Right, Alternating pattern, Forwards ?Number of Stairs: 2 ?General stair comments: No LOB noted. Min guard to supervision for safety. ? ?Wheelchair Mobility ?  ? ?Modified Rankin (Stroke Patients Only) ?Modified Rankin (Stroke Patients Only) ?Pre-Morbid Rankin Score: No symptoms ?Modified Rankin: Moderately severe disability ? ?  ? ?Balance Overall balance assessment: Needs assistance ?Sitting-balance support: No upper extremity supported, Feet supported ?Sitting balance-Leahy Scale: Good ?  ?  ?Standing balance support: No upper extremity supported ?Standing balance-Leahy Scale: Fair ?  ?  ?  ?  ?  ?  ?  ?  ?  ?Standardized Balance Assessment ?Standardized Balance Assessment : Dynamic Gait Index ?  ?Dynamic Gait Index ?Level Surface: Mild Impairment ?Gait with Horizontal Head Turns: Mild Impairment ?Gait with Vertical Head Turns: Mild Impairment ?Gait and Pivot Turn: Normal ?Step Over Obstacle: Normal ?Step Around Obstacles: Mild Impairment ?   ? ? ? ?Pertinent Vitals/Pain Pain Assessment ?Pain Assessment: No/denies pain  ? ? ?Home Living Family/patient expects to be discharged to:: Private residence ?Living Arrangements: Alone ?Available Help at Discharge: Family ?Type of Home: House ?Home Access: Stairs to enter ?Entrance Stairs-Rails:  Right;Left;Can reach both ?Entrance Stairs-Number of Steps:  3 ?Alternate Level Stairs-Number of Steps: flight ?Home Layout: Laundry or work area in basement;Two level ?Home Equipment: Agricultural consultant (2 wheels) ?   ?  ?Prior Function Prior Level of Function : Independent/Modified Independent ?  ?  ?  ?  ?  ?  ?  ?  ?  ? ? ?Hand Dominance  ?   ? ?  ?Extremity/Trunk Assessment  ? Upper Extremity Assessment ?Upper Extremity Assessment: Defer to OT evaluation ?  ? ?Lower Extremity Assessment ?Lower Extremity Assessment: Overall WFL for tasks assessed ?  ? ?Cervical / Trunk Assessment ?Cervical / Trunk Assessment: Normal  ?Communication  ? Communication: No difficulties  ?Cognition Arousal/Alertness: Awake/alert ?Behavior During Therapy: [facility] for tasks assessed/performed ?Overall Cognitive Status: Impaired/Different from baseline ?Area of Impairment: Safety/judgement, Problem solving, Following commands ?  ?  ?  ?  ?  ?  ?  ?  ?  ?  ?  ?Following Commands: Follows one step commands with increased time ?Safety/Judgement: Decreased awareness of safety, Decreased awareness of deficits ?  ?Problem Solving: Slow processing ?General Comments: Pt with slow processing noted and decreased awareness of safety/deficits. ?  ?  ? ?  ?General Comments General comments (skin integrity, edema, etc.): With visual assessment, noted decreased peripheral vision in L eye. ? ?  ?Exercises    ? ?Assessment/Plan  ?  ?PT Assessment Patient needs continued PT services  ?PT Problem List Decreased balance;Decreased activity tolerance;Decreased mobility;Decreased knowledge of precautions;Decreased safety awareness;Cardiopulmonary status limiting activity ? ?   ?  ?PT Treatment Interventions DME instruction;Gait training;Functional mobility training;Stair training   ? ?PT Goals (Current goals can be found in the Care Plan section)  ?Acute Rehab PT Goals ?Patient Stated Goal: to go home ?PT Goal Formulation: With patient ?Time For Goal Achievement: 02/18/22 ?Potential to Achieve Goals: Good ? ?  ?Frequency Min  3X/week ?  ? ? ?Co-evaluation   ?  ?  ?  ?  ? ? ?  ?AM-PAC PT "6 Clicks" Mobility  ?Outcome Measure Help needed turning from your back to your side while in a flat bed without using bedrails?: None ?Help needed moving from lying on your back to sitting on the side of a flat bed without using bedrails?: None ?Help needed moving to and from a bed to a chair (including a wheelchair)?: A Little ?Help needed standing up from a chair using your arms (e.g., wheelchair or bedside chair)?: A Little ?Help needed to walk in hospital room?: A Little ?Help needed climbing 3-5 steps with a railing? : A Little ?6 Click Score: 20 ? ?  ?End of Session Equipment Utilized During Treatment: Gait belt ?Activity Tolerance: Patient tolerated treatment well ?Patient left: in bed;with call bell/phone within reach;with family/visitor present ?Nurse Communication: Mobility status ?PT Visit Diagnosis: Other abnormalities of gait and mobility (R26.89);Other symptoms and signs involving the nervous system (R29.898) ?  ? ?Time: 4580-9983 ?PT Time Calculation (min) (ACUTE ONLY): 13 min ? ? ?Charges:   PT Evaluation ?$PT Eval Low Complexity: 1 Low ?  ?  ?   ? ? ?Farley Ly, PT, DPT  ?Acute Rehabilitation Services  ?Pager: [phone] ?Office: [phone] ? ? ?Cheyenne Valenzuela ?02/04/2022, 10:53 AM ? ?

## 2022-02-04 NOTE — Progress Notes (Signed)
? ? ?HD#0 ?SUBJECTIVE:  ?Patient Summary: Cheyenne Valenzuela is a 66 y.o. with a pertinent PMH of hypertension, hyperlipidemia, intracranial atherosclerosis, who presented with headache, blurry vision, dizziness and admitted for stroke.  ? ?Overnight Events: Admitted overnight ? ?  ?Interm History:  ?Patient seen and evaluated at bedside. States she feels improved this morning. Her vision has improved. She is not having a headache when she lies down. Does endorse slight headache when she sits up. She is eating, drinking, and toileting without difficulty. Has not gotten out of bed much.  ? ?OBJECTIVE:  ?Vital Signs: ?Vitals:  ? 02/04/22 0445 02/04/22 0456 02/04/22 0725 02/04/22 1132  ?BP:  (!) 163/97 (!) 166/89 (!) 151/82  ?Pulse:  88 77 83  ?Resp:  17 16 16   ?Temp: 98.2 ?F (36.8 ?C) 98.4 ?F (36.9 ?C) 99.1 ?F (37.3 ?C) 98.7 ?F (37.1 ?C)  ?TempSrc:  Oral Oral Oral  ?SpO2:  100% 100% 100%  ?Weight:      ?Height:      ? ?Supplemental O2:  ?SpO2: 100 % ? ?Filed Weights  ? 02/03/22 1510  ?Weight: 77.1 kg  ? ? ? ?Intake/Output Summary (Last 24 hours) at 02/04/2022 1442 ?Last data filed at 02/03/2022 2023 ?Gross per 24 hour  ?Intake 1000 ml  ?Output --  ?Net 1000 ml  ? ?Net IO Since Admission: 1,000 mL [02/04/22 1442] ? ?Physical Exam: ?General: No acute distress ?Cardio: Regular rate and rhythm ?Pulm: Clear to auscultation, normal respiratory effort ?Abd: Soft nontender ?Msk: Moves all extremities, noted formed ?Skin: Warm and dry ?Neuro: Alert and oriented, pupils equal round reactive, no nystagmus, extraocular movement intact.  Mild facial droop with smile but cranial nerves otherwise intact, speech coherent fluent. no dysdiadochokinesis.  Some dysmetria noted on finger-nose testing bilaterally, strength and sensation in bilateral upper and lower extremities intact.  Heel-to-shin testing normal. ? ?Patient Lines/Drains/Airways Status   ? ? Active Line/Drains/Airways   ? ? Name Placement date Placement time Site Days  ?  Peripheral IV 02/03/22 20 G Left Antecubital 02/03/22  1810  Antecubital  1  ? ?  ?  ? ?  ? ? ?Pertinent Labs: ? ?  Latest Ref Rng & Units 02/04/2022  ?  3:34 AM 02/03/2022  ?  3:35 PM 01/24/2021  ?  3:16 AM  ?CBC  ?WBC 4.0 - 10.5 K/uL 14.9   15.6   9.9    ?Hemoglobin 12.0 - 15.0 g/dL 10.3   12.0   11.8    ?Hematocrit 36.0 - 46.0 % 31.9   35.7   36.7    ?Platelets 150 - 400 K/uL 411   501   436    ? ? ? ?  Latest Ref Rng & Units 02/04/2022  ?  3:34 AM 02/03/2022  ?  3:35 PM 12/12/2021  ?  3:05 PM  ?CMP  ?Glucose 70 - 99 mg/dL 161   180   97    ?BUN 8 - 23 mg/dL 9   12   16     ?Creatinine 0.44 - 1.00 mg/dL 0.84   0.87   1.01    ?Sodium 135 - 145 mmol/L 139   138   142    ?Potassium 3.5 - 5.1 mmol/L 3.6   3.5   3.8    ?Chloride 98 - 111 mmol/L 105   102   103    ?CO2 22 - 32 mmol/L 24   27   25     ?Calcium 8.9 - 10.3 mg/dL  8.8   9.3   9.9    ?Total Protein 6.5 - 8.1 g/dL  7.7     ?Total Bilirubin 0.3 - 1.2 mg/dL  0.5     ?Alkaline Phos 38 - 126 U/L  85     ?AST 15 - 41 U/L  18     ?ALT 0 - 44 U/L  18     ? ? ?Recent Labs  ?  02/03/22 ?2034 02/04/22 ?[id number] 02/04/22 ?1134  ?GLUCAP 185* 193* 203*  ?  ? ?Pertinent Imaging: ?CT ANGIO HEAD NECK W WO CM ? ?Result Date: 02/04/2022 ?CLINICAL DATA:  Dizziness, infarcts on same-day MRI EXAM: CT ANGIOGRAPHY HEAD AND NECK TECHNIQUE: Multidetector CT imaging of the head and neck was performed using the standard protocol during bolus administration of intravenous contrast. Multiplanar CT image reconstructions and MIPs were obtained to evaluate the vascular anatomy. Carotid stenosis measurements (when applicable) are obtained utilizing NASCET criteria, using the distal internal carotid diameter as the denominator. RADIATION DOSE REDUCTION: This exam was performed according to the departmental dose-optimization program which includes automated exposure control, adjustment of the mA and/or kV according to patient size and/or use of iterative reconstruction technique. CONTRAST:  107mL OMNIPAQUE  IOHEXOL 350 MG/ML SOLN COMPARISON:  01/23/2021 CTA head CT head 02/03/2022, MRI head 02/03/2022. FINDINGS: CT HEAD FINDINGS For noncontrast findings, please see same day CT head. CTA NECK FINDINGS Aortic arch: Standard branching. Imaged portion shows no evidence of aneurysm or dissection. No significant stenosis of the major arch vessel origins. Aortic atherosclerosis. Right carotid system: No evidence of dissection, occlusion, or hemodynamically significant stenosis (greater than 50%). Left carotid system: No evidence of dissection, occlusion, or hemodynamically significant stenosis (greater than 50%). Vertebral arteries: No evidence of dissection, occlusion, or hemodynamically significant stenosis (greater than 50%). Skeleton: No acute osseous abnormality. Other neck: No acute finding. Upper chest: No focal pulmonary opacity or pleural effusion. Review of the MIP images confirms the above findings CTA HEAD FINDINGS Anterior circulation: Both internal carotid arteries are patent to the termini, with moderate narrowing in the right supraclinoid ICA. A1 segments patent. Normal anterior communicating artery. Anterior cerebral arteries are patent to their distal aspects. No M1 stenosis or occlusion. Normal MCA bifurcations. Distal MCA branches perfused and symmetric. Posterior circulation: Vertebral arteries patent to the vertebrobasilar junction with multifocal narrowing, left-greater-than-right. 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 Superior cerebellar arteries are not well visualized. Patent right P1 segment. Fetal origin of the left PCA. Mild narrowing in the proximal right P2, which appears similar to the prior exam. Only the more lateral right P3 branch is visualized, although this appear similar to the prior exam. PCAs otherwise perfused to their distal aspects without stenosis. The right posterior communicating artery is not visualized. Venous sinuses: As permitted by  contrast timing, patent. Anatomic variants: None significant. Review of the MIP images confirms the above findings IMPRESSION: 1. No significant change from the prior CTA, with multifocal narrowing in the vertebral arteries and basilar artery, with poor visualization of the more medial right P3 branches. No intracranial large vessel occlusion. 2.  No hemodynamically significant stenosis in the neck. Electronically Signed   By: Merilyn Baba M.D.   On: 02/04/2022 00:08  ? ?ECHOCARDIOGRAM COMPLETE BUBBLE STUDY ? ?Result Date: 02/04/2022 ?   ECHOCARDIOGRAM REPORT   Patient Name:   Cheyenne Valenzuela Date of Exam: 02/04/2022 Medical Rec #:  [id number]   Height:       63.0  in Accession #:    [id number]  Weight:       170.0 lb Date of Birth:  Jun 19, 1956    BSA:          1.805 m? Patient Age:    57 years    BP:           151/82 mmHg Patient Gender: F           HR:           76 bpm. Exam Location:  Inpatient Procedure: 2D Echo, Cardiac Doppler, Color Doppler, Strain Analysis and Saline            Contrast Bubble Study Indications:    CVA  History:        Patient has no prior history of Echocardiogram examinations.                 Risk Factors:Hypertension and Dyslipidemia.  Sonographer:    [place] Referring Phys: [id number] [place]  1. Poor tracking of endocardium on strain imaging . Left ventricular ejection fraction, by estimation, is 60 to 65%. The left ventricle has normal function. The left ventricle has no regional wall motion abnormalities. There is severe left ventricular hypertrophy. Left ventricular diastolic parameters were normal.  2. Right ventricular systolic function is normal. The right ventricular size is normal.  3. The mitral valve is normal in structure. Trivial mitral valve regurgitation. No evidence of mitral stenosis.  4. The aortic valve is tricuspid. There is mild calcification of the aortic valve. There is mild thickening of the aortic valve. Aortic valve regurgitation is not  visualized. Mild aortic valve stenosis.  5. The inferior vena cava is normal in size with greater than 50% respiratory variability, suggesting right atrial pressure of 3 mmHg.  6. Agitated saline contrast bubble study

## 2022-02-04 NOTE — Discharge Summary (Signed)
? ?Name: Cheyenne Valenzuela ?MRN: [id number] ?DOB: 07-13-1956 66 y.o. ?PCP: Madalyn Rob, MD ? ?Date of Admission: 02/03/2022  2:50 PM ?Date of Discharge: 02/04/2022 ?Attending Physician: Dr. Philipp Ovens ? ?Discharge Diagnosis: ?Principal Problem: ?  Acute ischemic right PCA stroke ([place]) ?  ? ?Discharge Medications: ?Allergies as of 02/04/2022   ? ?   Reactions  ? Lisinopril Swelling  ? ?  ? ?  ?Medication List  ?  ? ?TAKE these medications   ? ?albuterol 108 (90 Base) MCG/ACT inhaler ?Commonly known as: VENTOLIN HFA ?Inhale 2 puffs into the lungs every 4 (four) hours as needed for shortness of breath. ?  ?amLODipine 10 MG tablet ?Commonly known as: NORVASC ?Take 1 tablet (10 mg total) by mouth daily. ?  ?aspirin 325 MG EC tablet ?Take 1 tablet (325 mg total) by mouth daily. ?  ?atorvastatin 80 MG tablet ?Commonly known as: Lipitor ?Take 1 tablet (80 mg total) by mouth daily. ?  ?clopidogrel 75 MG tablet ?Commonly known as: PLAVIX ?Take 1 tablet (75 mg total) by mouth daily. ?Start taking on: February 05, 2022 ?  ?ezetimibe 10 MG tablet ?Commonly known as: Zetia ?Take 1 tablet (10 mg total) by mouth daily. ?  ?ibuprofen 200 MG tablet ?Commonly known as: ADVIL ?Take 200 mg by mouth every 6 (six) hours as needed for mild pain. ?  ?losartan-hydrochlorothiazide 100-25 MG tablet ?Commonly known as: Hyzaar ?Take 1 tablet by mouth daily. ?  ?metFORMIN 1000 MG tablet ?Commonly known as: GLUCOPHAGE ?Take 1 tablet (1,000 mg total) by mouth 2 (two) times daily with a meal. ?  ? ?  ? ? ?Disposition and follow-up:   ?[name] was discharged from [facility] in Stable condition.  At the hospital follow up visit please address: ? ?1.  Follow-up: ? a.  Stroke of right PCA.  Patient improved symptomatically.  Outpatient PT/OT recommended.  We will need to continue on aspirin 325, Plavix 75 for 3 months then aspirin after that.  Follow-up with [facility] 4 weeks ?  ? b.  Diabetes-could consider initiation of SGLT2 inhibitor versus  GLP-1. ? ? c.  Hyperlipidemia-high-dose statin, Zetia ? ? d. ? ?2.  Labs / imaging needed at time of follow-up: None ? ?3.  Pending labs/ test needing follow-up: None ? ? ?4.  Medication Changes ? Started: Aspirin 325, Plavix 75 ? ?Follow-up Appointments: ? Follow-up Information   ? ? Madalyn Rob, MD Follow up in 1 week(s).   ?Specialty: Internal Medicine ?Contact information: ?[address] ?[phone] ?[place] [address] ?[phone] ? ? ?  ?  ? ? [facility] Follow up in 1 month(s).   ?Contact information: ?[place] [phone] ?[phone] ? ?  ?  ? ?  ?  ? ?  ? ? ?Hospital Course by problem list:  ?Patient presented to [facility] clinic with complaints of  blurry vision, headache, poor balance.  Patient was sent to radiology from clinic for stat head CT which suggested acute right PCA stroke.  This was followed up with MRI which showed acute right-sided posterior stroke.  Clinic PCP Dr. Court Joy spoke with Dr. Leonie Man in ED and reviewed images with him.  Patient was noted to be out of tPA window, stroke team was consulted and provided input.  She was admitted to MTS service for further stroke work-up.  Echocardiogram was obtained and patient was monitored on telemetry with frequent neurochecks.  The day  after admission, patient showed improvement in her symptoms with resolution of ocular symptoms.  Headache was still present when she would sit up but otherwise patient reported she felt much better.  PT/OT evaluated the patient and recommended outpatient physical therapy and Occupational Therapy.  Inpatient neurology evaluated she be discharged with dual antiplatelet therapy and follow-up with neurology in 4 weeks.  She will also need to be scheduled for outpatient cardiac monitoring, and this will be organized via neurology and cardiology. ?  ? ?Discharge Subjective: ?Patient reporting she feels much better.  States her vision symptoms have improved,  she does endorse some slight headache.  Otherwise no complaints and ready to go home. ? ?Discharge Exam:   ?BP (!) 152/68 (BP Location: Right Arm)   Pulse 87   Temp 98.5 ?F (36.9 ?C) (Oral)   Resp 16   Ht 5\' 3"  (1.6 m)   Wt 77.1 kg   SpO2 98%   BMI 30.11 kg/m?  ?General: No acute distress ?Cardio: Regular rate and rhythm ?Pulm: Clear to auscultation, normal respiratory effort ?Abd: Soft nontender ?Msk: Moves all extremities, noted formed ?Skin: Warm and dry ?Neuro: Alert and oriented, pupils equal round reactive, no nystagmus, extraocular movement intact.  Mild facial droop with smile but cranial nerves otherwise intact, speech coherent fluent. no dysdiadochokinesis.  Some dysmetria noted on finger-nose testing bilaterally, strength and sensation in bilateral upper and lower extremities intact.  Heel-to-shin testing normal. ? ?Pertinent Labs, Studies, and Procedures:  ? ?  Latest Ref Rng & Units 02/04/2022  ?  3:34 AM 02/03/2022  ?  3:35 PM 01/24/2021  ?  3:16 AM  ?CBC  ?WBC 4.0 - 10.5 K/uL 14.9   15.6   9.9    ?Hemoglobin 12.0 - 15.0 g/dL 10.3   12.0   11.8    ?Hematocrit 36.0 - 46.0 % 31.9   35.7   36.7    ?Platelets 150 - 400 K/uL 411   501   436    ? ? ? ?  Latest Ref Rng & Units 02/04/2022  ?  3:34 AM 02/03/2022  ?  3:35 PM 12/12/2021  ?  3:05 PM  ?CMP  ?Glucose 70 - 99 mg/dL 161   180   97    ?BUN 8 - 23 mg/dL 9   12   16     ?Creatinine 0.44 - 1.00 mg/dL 0.84   0.87   1.01    ?Sodium 135 - 145 mmol/L 139   138   142    ?Potassium 3.5 - 5.1 mmol/L 3.6   3.5   3.8    ?Chloride 98 - 111 mmol/L 105   102   103    ?CO2 22 - 32 mmol/L 24   27   25     ?Calcium 8.9 - 10.3 mg/dL 8.8   9.3   9.9    ?Total Protein 6.5 - 8.1 g/dL  7.7     ?Total Bilirubin 0.3 - 1.2 mg/dL  0.5     ?Alkaline Phos 38 - 126 U/L  85     ?AST 15 - 41 U/L  18     ?ALT 0 - 44 U/L  18     ? ? ?CT ANGIO HEAD NECK W WO CM ? ?Result Date: 02/04/2022 ?CLINICAL DATA:  Dizziness, infarcts on same-day MRI EXAM: CT ANGIOGRAPHY HEAD AND NECK TECHNIQUE:  Multidetector CT imaging of the head and neck was performed using the standard protocol during bolus administration of intravenous  contrast. Multiplanar CT image reconstructions and MIPs were obtained to evaluate the vascular anatomy. Carotid stenosis measurements (when applicable) are obtained utilizing NASCET criteria, using the distal internal carotid diameter as the denominator. RADIATION DOSE REDUCTION: This exam was performed according to the departmental dose-optimization program which includes automated exposure control, adjustment of the mA and/or kV according to patient size and/or use of iterative reconstruction technique. CONTRAST:  20mL OMNIPAQUE IOHEXOL 350 MG/ML SOLN COMPARISON:  01/23/2021 CTA head CT head 02/03/2022, MRI head 02/03/2022. FINDINGS: CT HEAD FINDINGS For noncontrast findings, please see same day CT head. CTA NECK FINDINGS Aortic arch: Standard branching. Imaged portion shows no evidence of aneurysm or dissection. No significant stenosis of the major arch vessel origins. Aortic atherosclerosis. Right carotid system: No evidence of dissection, occlusion, or hemodynamically significant stenosis (greater than 50%). Left carotid system: No evidence of dissection, occlusion, or hemodynamically significant stenosis (greater than 50%). Vertebral arteries: No evidence of dissection, occlusion, or hemodynamically significant stenosis (greater than 50%). Skeleton: No acute osseous abnormality. Other neck: No acute finding. Upper chest: No focal pulmonary opacity or pleural effusion. Review of the MIP images confirms the above findings CTA HEAD FINDINGS Anterior circulation: Both internal carotid arteries are patent to the termini, with moderate narrowing in the right supraclinoid ICA. A1 segments patent. Normal anterior communicating artery. Anterior cerebral arteries are patent to their distal aspects. No M1 stenosis or occlusion. Normal MCA bifurcations. Distal MCA branches perfused and  symmetric. Posterior circulation: Vertebral arteries patent to the vertebrobasilar junction with multifocal narrowing, left-greater-than-right. 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

## 2022-02-04 NOTE — Progress Notes (Signed)
STROKE TEAM PROGRESS NOTE  ? ?SUBJECTIVE (INTERVAL HISTORY) ?Her mom and husband are at the bedside.  Overall her condition is stable.  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  She denies heart palpitation. ? ? ?OBJECTIVE ?Temp:  [98.2 ?F (36.8 ?C)-99.1 ?F (37.3 ?C)] 98.5 ?F (36.9 ?C) (04/29 1514) ?Pulse Rate:  [75-106] 87 (04/29 1514) ?Cardiac Rhythm: Normal sinus rhythm (04/29 0503) ?Resp:  [12-26] 16 (04/29 1514) ?BP: (151-190)/(68-97) 152/68 (04/29 1514) ?SpO2:  [96 %-100 %] 98 % (04/29 1514) ? ?Recent Labs  ?Lab 02/03/22 ?2034 02/04/22 ?1062 02/04/22 ?1134  ?GLUCAP 185* 193* 203*  ? ?Recent Labs  ?Lab 02/03/22 ?1535 02/04/22 ?0334  ?NA 138 139  ?K 3.5 3.6  ?CL 102 105  ?CO2 27 24  ?GLUCOSE 180* 161*  ?BUN 12 9  ?CREATININE 0.87 0.84  ?CALCIUM 9.3 8.8*  ? ?Recent Labs  ?Lab 02/03/22 ?1535  ?AST 18  ?ALT 18  ?ALKPHOS 85  ?BILITOT 0.5  ?PROT 7.7  ?ALBUMIN 3.5  ? ?Recent Labs  ?Lab 02/03/22 ?1535 02/04/22 ?0334  ?WBC 15.6* 14.9*  ?NEUTROABS 11.1*  --   ?HGB 12.0 10.3*  ?HCT 35.7* 31.9*  ?MCV 85.6 85.8  ?PLT 501* 411*  ? ?No results for input(s): CKTOTAL, CKMB, CKMBINDEX, TROPONINI in the last 168 hours. ?Recent Labs  ?  02/03/22 ?1535  ?LABPROT 12.9  ?INR 1.0  ? ?No results for input(s): COLORURINE, LABSPEC, PHURINE, GLUCOSEU, HGBUR, BILIRUBINUR, KETONESUR, PROTEINUR, UROBILINOGEN, NITRITE, LEUKOCYTESUR in the last 72 hours. ? ?Invalid input(s): APPERANCEUR  ?   ?Component Value Date/Time  ? CHOL 226 (H) 02/04/2022 0334  ? CHOL 217 (H) 01/10/2022 1525  ? TRIG 236 (H) 02/04/2022 0334  ? HDL 33 (L) 02/04/2022 0334  ? HDL 32 (L) 01/10/2022 1525  ? CHOLHDL 6.8 02/04/2022 0334  ? VLDL 47 (H) 02/04/2022 0334  ? LDLCALC 146 (H) 02/04/2022 0334  ? LDLCALC 149 (H) 01/10/2022 1525  ? ?Lab Results  ?Component Value Date  ? HGBA1C 7.8 (H) 02/04/2022  ? ?No results found for: LABOPIA, COCAINSCRNUR, LABBENZ, AMPHETMU, THCU, LABBARB  ?No results for input(s): ETH in the last 168 hours. ? ?I have  personally reviewed the radiological images below and agree with the radiology interpretations. ? ?CT ANGIO HEAD NECK W WO CM ? ?Result Date: 02/04/2022 ?CLINICAL DATA:  Dizziness, infarcts on same-day MRI EXAM: CT ANGIOGRAPHY HEAD AND NECK TECHNIQUE: Multidetector CT imaging of the head and neck was performed using the standard protocol during bolus administration of intravenous contrast. Multiplanar CT image reconstructions and MIPs were obtained to evaluate the vascular anatomy. Carotid stenosis measurements (when applicable) are obtained utilizing NASCET criteria, using the distal internal carotid diameter as the denominator. RADIATION DOSE REDUCTION: This exam was performed according to the departmental dose-optimization program which includes automated exposure control, adjustment of the mA and/or kV according to patient size and/or use of iterative reconstruction technique. CONTRAST:  52mL OMNIPAQUE IOHEXOL 350 MG/ML SOLN COMPARISON:  01/23/2021 CTA head CT head 02/03/2022, MRI head 02/03/2022. FINDINGS: CT HEAD FINDINGS For noncontrast findings, please see same day CT head. CTA NECK FINDINGS Aortic arch: Standard branching. Imaged portion shows no evidence of aneurysm or dissection. No significant stenosis of the major arch vessel origins. Aortic atherosclerosis. Right carotid system: No evidence of dissection, occlusion, or hemodynamically significant stenosis (greater than 50%). Left carotid system: No evidence of dissection, occlusion, or hemodynamically significant stenosis (greater than 50%). Vertebral arteries: No evidence of dissection, occlusion, or hemodynamically significant stenosis (  greater than 50%). Skeleton: No acute osseous abnormality. Other neck: No acute finding. Upper chest: No focal pulmonary opacity or pleural effusion. Review of the MIP images confirms the above findings CTA HEAD FINDINGS Anterior circulation: Both internal carotid arteries are patent to the termini, with moderate  narrowing in the right supraclinoid ICA. A1 segments patent. Normal anterior communicating artery. Anterior cerebral arteries are patent to their distal aspects. No M1 stenosis or occlusion. Normal MCA bifurcations. Distal MCA branches perfused and symmetric. Posterior circulation: Vertebral arteries patent to the vertebrobasilar junction with multifocal narrowing, left-greater-than-right. 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 Superior cerebellar arteries are not well visualized. Patent right P1 segment. Fetal origin of the left PCA. Mild narrowing in the proximal right P2, which appears similar to the prior exam. Only the more lateral right P3 branch is visualized, although this appear similar to the prior exam. PCAs otherwise perfused to their distal aspects without stenosis. The right posterior communicating artery is not visualized. Venous sinuses: As permitted by contrast timing, patent. Anatomic variants: None significant. Review of the MIP images confirms the above findings IMPRESSION: 1. No significant change from the prior CTA, with multifocal narrowing in the vertebral arteries and basilar artery, with poor visualization of the more medial right P3 branches. No intracranial large vessel occlusion. 2.  No hemodynamically significant stenosis in the neck. Electronically Signed   By: Wiliam Ke M.D.   On: 02/04/2022 00:08  ? ?CT HEAD WO CONTRAST ( ) ? ?Result Date: 02/03/2022 ?CLINICAL DATA:  Dizziness, persistent/recurrent, cardiac or vascular cause suspected EXAM: CT HEAD WITHOUT CONTRAST TECHNIQUE: Contiguous axial images were obtained from the base of the skull through the vertex without intravenous contrast. RADIATION DOSE REDUCTION: This exam was performed according to the departmental dose-optimization program which includes automated exposure control, adjustment of the mA and/or kV according to patient size and/or use of iterative reconstruction technique.  COMPARISON:  Same day MRI head.  CT head January 23, 2021. FINDINGS: Brain: Acute right PCA territory infarcts, better characterized on concurrent MRI. Probable mild petechial hemorrhage. No hydrocephalus, midline shift, mass lesion, or visible extra-axial fluid collection. Vascular: No hyperdense vessel identified. Calcific intracranial atherosclerosis. Skull: No acute fracture. Sinuses/Orbits: Clear sinuses.  No acute orbital findings. Other: No mastoid effusions. IMPRESSION: Acute right PCA territory infarcts, better characterized on concurrent MRI. Probable mild petechial hemorrhage. These results will be called to the ordering clinician or representative by the Radiologist Assistant, and communication documented in the PACS or [employer]. Electronically Signed   By: Feliberto Harts M.D.   On: 02/03/2022 13:35  ? ?MR Brain W Wo Contrast ? ?Result Date: 02/03/2022 ?CLINICAL DATA:  Dizziness, persistent/recurrent, cardiac or vascular cause suspected; headache, new or worsening (Age >= 50y) EXAM: MRI HEAD WITHOUT AND WITH CONTRAST TECHNIQUE: Multiplanar, multiecho pulse sequences of the brain and surrounding structures were obtained without and with intravenous contrast. CONTRAST:  69mL GADAVIST GADOBUTROL 1 MMOL/ML IV SOLN COMPARISON:  01/23/2021 FINDINGS: Brain: Restricted diffusion is present in the right occipital lobe, posterior temporal lobe, thalamus, inferior parasagittal parietal lobe, right hippocampal head, and right cerebellum. There is also involvement of the left cerebellum. There is minimal susceptibility in the right occipital lobe that may reflect petechial hemorrhage. Enhancement is present in the right parieto-occipital region. Ventricles and sulci are normal in size and configuration. Additional patchy T2 hyperintensity in the supratentorial white matter is nonspecific but may reflect minor chronic microvascular ischemic changes. There is no intracranial  mass or significant mass effect.  Vascular: Major vessel flow voids at the skull base are preserved. Skull and upper cervical spine: Normal marrow signal is preserved. Sinuses/Orbits: Paranasal sinuses are aerated. Orbits are unremarkable. Other:

## 2022-02-04 NOTE — Care Management Obs Status (Signed)
MEDICARE OBSERVATION STATUS NOTIFICATION ? ? ?Patient Details  ?Name: Cheyenne Valenzuela ?MRN: [id number] ?Date of Birth: Aug 07, 1956 ? ? ?Medicare Observation Status Notification Given:  Yes ? ? ? ?Bess Kinds, RN ?02/04/2022, 2:54 PM ?

## 2022-02-04 NOTE — Discharge Instructions (Addendum)
Dear Mrs. Cheyenne Valenzuela, ? ?Thank you for trusting [place] with your care. We treated you for a stroke. ? ?Please take 325mg  aspirin daily. ?- take 75mg  Plavix daily for 3 months with the aspirin. After 3 months, just take the aspirin. ?- take Atorvastatin 80mg  daily. Please also take the Zetia daily. ?- please follow up with [facility] in 4 weeks. ?- please follow up with cardiology to have a cardiac monitor placed. ?- Please follow up with your primary care doctor in 1-2 weeks. ?

## 2022-02-04 NOTE — Evaluation (Signed)
Occupational Therapy Evaluation ?Patient Details ?Name: Cheyenne Valenzuela ?MRN: [id number] ?DOB: 22-Mar-1956 ?Today's Date: 02/04/2022 ? ? ?History of Present Illness Pt is a 66 y/o female admitted secondary to L vision deficits and dizziness. Found to have R PCA infarct. PMH includes HTN.  ? ?Clinical Impression ?  ?Pt admitted for concerns listed above. PTA Pt reported that she was independent with all ADL's and  IADL's, including driving. At this time, pt presents with significant L visual field cut. Pt has decreased awareness of her L sided vision, and reports she sees things until she is asked to report what it is that she is seeing, then she states that she can not see it. Functionally, pt is running into things on her L side, however reports that she is not concerned about it. Recommending OP OT for further visual assessment and treatment. OT will follow acutely.   ?   ? ?Recommendations for follow up therapy are one component of a multi-disciplinary discharge planning process, led by the attending physician.  Recommendations may be updated based on patient status, additional functional criteria and insurance authorization.  ? ?Follow Up Recommendations ? Outpatient OT (Neuro/vision based if possible)  ?  ?Assistance Recommended at Discharge Set up Supervision/Assistance  ?Patient can return home with the following A little help with bathing/dressing/bathroom;Assistance with cooking/housework;Assist for transportation ? ?  ?Functional Status Assessment ? Patient has had a recent decline in their functional status and demonstrates the ability to make significant improvements in function in a reasonable and predictable amount of time.  ?Equipment Recommendations ? None recommended by OT  ?  ?Recommendations for Other Services   ? ? ?  ?Precautions / Restrictions Precautions ?Precautions: Fall ?Restrictions ?Weight Bearing Restrictions: No  ? ?  ? ?Mobility Bed Mobility ?Overal bed mobility: Independent ?  ?  ?  ?  ?  ?  ?   ?  ? ?Transfers ?Overall transfer level: Needs assistance ?Equipment used: None ?Transfers: Sit to/from Stand ?Sit to Stand: Supervision ?  ?  ?  ?  ?  ?General transfer comment: Supervision for safety. ?  ? ?  ?Balance Overall balance assessment: Needs assistance ?Sitting-balance support: No upper extremity supported, Feet supported ?Sitting balance-Leahy Scale: Good ?  ?  ?Standing balance support: No upper extremity supported ?Standing balance-Leahy Scale: Fair ?  ?  ?  ?  ?  ?  ?  ?  ?  ?  ?  ?  ?   ? ?ADL either performed or assessed with clinical judgement  ? ?ADL Overall ADL's : At baseline;Modified independent ?  ?  ?  ?  ?  ?  ?  ?  ?  ?  ?  ?  ?  ?  ?  ?  ?  ?  ?  ?General ADL Comments: Pt mainly needs assist with IADL's due to visual deficits  ? ? ? ?Vision Baseline Vision/History: 0 No visual deficits ?Ability to See in Adequate Light: 0 Adequate ?Patient Visual Report: No change from baseline ?Vision Assessment?: Vision impaired- to be further tested in functional context ?Additional Comments: Significant L visual field cut. Pt will tell you that she sees your hand or object, but then when asked to tell you how many fingers you're holding up or what object you're holding, pt reports that she can not see it in her L visual field unless in complete midline.  ?   ?Perception   ?  ?Praxis   ?  ? ?Pertinent Vitals/Pain  Pain Assessment ?Pain Assessment: No/denies pain  ? ? ? ?Hand Dominance Right ?  ?Extremity/Trunk Assessment Upper Extremity Assessment ?Upper Extremity Assessment: Overall WFL for tasks assessed ?  ?Lower Extremity Assessment ?Lower Extremity Assessment: Overall WFL for tasks assessed ?  ?Cervical / Trunk Assessment ?Cervical / Trunk Assessment: Normal ?  ?Communication Communication ?Communication: No difficulties ?  ?Cognition Arousal/Alertness: Awake/alert ?Behavior During Therapy: [facility] for tasks assessed/performed ?Overall Cognitive Status: Impaired/Different from baseline ?Area of  Impairment: Safety/judgement, Problem solving, Following commands ?  ?  ?  ?  ?  ?  ?  ?  ?  ?  ?  ?Following Commands: Follows one step commands with increased time ?Safety/Judgement: Decreased awareness of safety, Decreased awareness of deficits ?  ?Problem Solving: Slow processing ?General Comments: Pt with slow processing noted and decreased awareness of safety/deficits. ?  ?  ?General Comments  VSS on RA ? ?  ?Exercises   ?  ?Shoulder Instructions    ? ? ?Home Living Family/patient expects to be discharged to:: Private residence ?Living Arrangements: Alone ?Available Help at Discharge: Family ?Type of Home: House ?Home Access: Stairs to enter ?Entrance Stairs-Number of Steps: 3 ?Entrance Stairs-Rails: Right;Left;Can reach both ?Home Layout: Laundry or work area in basement;Two level ?Alternate Level Stairs-Number of Steps: flight ?Alternate Level Stairs-Rails: Left ?Bathroom Shower/Tub: Tub/shower unit ?  ?Bathroom Toilet: Standard ?  ?  ?Home Equipment: Conservation officer, nature (2 wheels) ?  ?  ?  ? ?  ?Prior Functioning/Environment Prior Level of Function : Independent/Modified Independent ?  ?  ?  ?  ?  ?  ?  ?  ?  ? ?  ?  ?OT Problem List: Impaired vision/perception;Decreased coordination;Decreased cognition;Decreased safety awareness;Impaired balance (sitting and/or standing) ?  ?   ?OT Treatment/Interventions: Self-care/ADL training;Therapeutic exercise;Energy conservation;DME and/or AE instruction;Therapeutic activities;Visual/perceptual remediation/compensation;Patient/family education;Balance training  ?  ?OT Goals(Current goals can be found in the care plan section) Acute Rehab OT Goals ?Patient Stated Goal: To go home ?OT Goal Formulation: With patient ?Time For Goal Achievement: 02/18/22 ?Potential to Achieve Goals: Good ?ADL Goals ?Additional ADL Goal #1: pt will scan and locate all items needed for grooming placed around sink on L side. ?Additional ADL Goal #2: PT will complete a medication management tasks  with no errors.  ?OT Frequency: Min 2X/week ?  ? ?Co-evaluation   ?  ?  ?  ?  ? ?  ?AM-PAC OT "6 Clicks" Daily Activity     ?Outcome Measure Help from another person eating meals?: None ?Help from another person taking care of personal grooming?: None ?Help from another person toileting, which includes using toliet, bedpan, or urinal?: None ?Help from another person bathing (including washing, rinsing, drying)?: None ?Help from another person to put on and taking off regular upper body clothing?: None ?Help from another person to put on and taking off regular lower body clothing?: None ?6 Click Score: 24 ?  ?End of Session Nurse Communication: Mobility status ? ?Activity Tolerance: Patient tolerated treatment well ?Patient left: in bed;with call bell/phone within reach;with family/visitor present ? ?OT Visit Diagnosis: Other (comment);Other abnormalities of gait and mobility (R26.89) (Visual field cut)  ?              ?Time: [id number] ?OT Time Calculation (min): 10 min ?Charges:  OT General Charges ?$OT Visit: 1 Visit ?OT Evaluation ?$OT Eval Moderate Complexity: 1 Mod ? ?Yechiel Erny H., OTR/L ?Acute Rehabilitation ? ?Cheyenne Valenzuela ?02/04/2022, 11:03 AM ?

## 2022-02-04 NOTE — Plan of Care (Signed)
?  Problem: Education: ?Goal: Knowledge of disease or condition will improve ?Outcome: Progressing ?Goal: Knowledge of secondary prevention will improve (SELECT ALL) ?Outcome: Progressing ?Goal: Knowledge of patient specific risk factors will improve (INDIVIDUALIZE FOR PATIENT) ?Outcome: Progressing ?Goal: Individualized Educational Video(s) ?Outcome: Progressing ?  ?Problem: Coping: ?Goal: Will verbalize positive feelings about self ?Outcome: Progressing ?Goal: Will identify appropriate support needs ?Outcome: Progressing ?  ?Problem: Self-Care: ?Goal: Ability to participate in self-care as condition permits will improve ?Outcome: Progressing ?Goal: Verbalization of feelings and concerns over difficulty with self-care will improve ?Outcome: Progressing ?Goal: Ability to communicate needs accurately will improve ?Outcome: Progressing ?  ?Problem: Education: ?Goal: Knowledge of General Education information will improve ?Description: Including pain rating scale, medication(s)/side effects and non-pharmacologic comfort measures ?Outcome: Progressing ?  ?Problem: Health Behavior/Discharge Planning: ?Goal: Ability to manage health-related needs will improve ?Outcome: Progressing ?  ?Problem: Clinical Measurements: ?Goal: Ability to maintain clinical measurements within normal limits will improve ?Outcome: Progressing ?Goal: Will remain free from infection ?Outcome: Progressing ?Goal: Diagnostic test results will improve ?Outcome: Progressing ?Goal: Respiratory complications will improve ?Outcome: Progressing ?Goal: Cardiovascular complication will be avoided ?Outcome: Progressing ?  ?Problem: Activity: ?Goal: Risk for activity intolerance will decrease ?Outcome: Progressing ?  ?Problem: Nutrition: ?Goal: Adequate nutrition will be maintained ?Outcome: Progressing ?  ?Problem: Coping: ?Goal: Level of anxiety will decrease ?Outcome: Progressing ?  ?Problem: Elimination: ?Goal: Will not experience complications related to  bowel motility ?Outcome: Progressing ?Goal: Will not experience complications related to urinary retention ?Outcome: Progressing ?  ?Problem: Pain Managment: ?Goal: General experience of comfort will improve ?Outcome: Progressing ?  ?Problem: Safety: ?Goal: Ability to remain free from injury will improve ?Outcome: Progressing ?  ?Problem: Skin Integrity: ?Goal: Risk for impaired skin integrity will decrease ?Outcome: Progressing ?  ?

## 2022-02-05 ENCOUNTER — Other Ambulatory Visit: Payer: Self-pay | Admitting: Medical

## 2022-02-05 DIAGNOSIS — I63531 Cerebral infarction due to unspecified occlusion or stenosis of right posterior cerebral artery: Secondary | ICD-10-CM

## 2022-02-05 NOTE — Progress Notes (Signed)
? ?  Medicine team requested 30-day monitor to further evaluate etiology of her stroke.  She does not follow with [facility].  We will place order for Dr. Mayford Knife to read monitor results.  Will ask scheduling to arrange a follow-up appointment in 2 months to review those results. ? ?Beatriz Stallion, PA-C ?02/05/22; 12:56 PM ? ?

## 2022-02-06 ENCOUNTER — Other Ambulatory Visit: Payer: Self-pay | Admitting: Medical

## 2022-02-06 ENCOUNTER — Telehealth: Payer: Self-pay | Admitting: Internal Medicine

## 2022-02-06 ENCOUNTER — Other Ambulatory Visit (HOSPITAL_COMMUNITY): Payer: Self-pay

## 2022-02-06 DIAGNOSIS — I4891 Unspecified atrial fibrillation: Secondary | ICD-10-CM

## 2022-02-06 DIAGNOSIS — R42 Dizziness and giddiness: Secondary | ICD-10-CM

## 2022-02-06 DIAGNOSIS — I63531 Cerebral infarction due to unspecified occlusion or stenosis of right posterior cerebral artery: Secondary | ICD-10-CM

## 2022-02-06 DIAGNOSIS — I639 Cerebral infarction, unspecified: Secondary | ICD-10-CM

## 2022-02-06 NOTE — Telephone Encounter (Signed)
TOC HFU APPT ? ?Name: Cheyenne Valenzuela, Cheyenne Valenzuela MRN: [id number]  ?Date: 02/09/2022 Status: Sch  ?Time: 8:45 AM Length: 30  ?Visit Type: OPEN ESTABLISHED [726] Copay: $0.00  ?Provider: Mitzi Hansen, MD    ? ?

## 2022-02-08 ENCOUNTER — Encounter: Payer: Self-pay | Admitting: Neurology

## 2022-02-08 ENCOUNTER — Ambulatory Visit (INDEPENDENT_AMBULATORY_CARE_PROVIDER_SITE_OTHER): Payer: Medicare Other

## 2022-02-08 DIAGNOSIS — I639 Cerebral infarction, unspecified: Secondary | ICD-10-CM | POA: Diagnosis not present

## 2022-02-08 DIAGNOSIS — I4891 Unspecified atrial fibrillation: Secondary | ICD-10-CM | POA: Diagnosis not present

## 2022-02-08 DIAGNOSIS — I63531 Cerebral infarction due to unspecified occlusion or stenosis of right posterior cerebral artery: Secondary | ICD-10-CM | POA: Diagnosis not present

## 2022-02-08 DIAGNOSIS — R42 Dizziness and giddiness: Secondary | ICD-10-CM | POA: Diagnosis not present

## 2022-02-09 ENCOUNTER — Other Ambulatory Visit: Payer: Self-pay

## 2022-02-09 ENCOUNTER — Ambulatory Visit (INDEPENDENT_AMBULATORY_CARE_PROVIDER_SITE_OTHER): Payer: Medicare Other | Admitting: Internal Medicine

## 2022-02-09 ENCOUNTER — Encounter: Payer: Self-pay | Admitting: Internal Medicine

## 2022-02-09 ENCOUNTER — Other Ambulatory Visit (HOSPITAL_COMMUNITY): Payer: Self-pay

## 2022-02-09 VITALS — BP 137/74 | HR 74 | Temp 97.7°F | Ht 63.0 in | Wt 171.7 lb

## 2022-02-09 DIAGNOSIS — I10 Essential (primary) hypertension: Secondary | ICD-10-CM | POA: Diagnosis not present

## 2022-02-09 DIAGNOSIS — Z8673 Personal history of transient ischemic attack (TIA), and cerebral infarction without residual deficits: Secondary | ICD-10-CM

## 2022-02-09 DIAGNOSIS — Z09 Encounter for follow-up examination after completed treatment for conditions other than malignant neoplasm: Secondary | ICD-10-CM | POA: Diagnosis not present

## 2022-02-09 DIAGNOSIS — E1169 Type 2 diabetes mellitus with other specified complication: Secondary | ICD-10-CM

## 2022-02-09 DIAGNOSIS — E785 Hyperlipidemia, unspecified: Secondary | ICD-10-CM

## 2022-02-09 MED ORDER — EMPAGLIFLOZIN 10 MG PO TABS
10.0000 mg | ORAL_TABLET | Freq: Every day | ORAL | 1 refills | Status: DC
Start: 2022-02-09 — End: 2022-05-12
  Filled 2022-02-09: qty 90, 90d supply, fill #0
  Filled 2022-03-16 – 2022-05-12 (×2): qty 90, 90d supply, fill #1

## 2022-02-09 NOTE — Patient Instructions (Signed)
Cheyenne Valenzuela, it was a pleasure seeing you today! ? ?Today we discussed: ? ?Recent stroke:  ?-Continue taking aspirin and plavix for a total of 3 months, after which time we will be able to stop the plavix and have you take the aspirin only. ?-Follow up with neurology as scheduled ?-Please let me know if you find an ophthalmologist that can see you sooner. I am happy to place a referral for you if needed. ? ?Diabetes: ?-Continue taking metformin ?-Start Jardiance. This medication will tell your kidneys to urinate out more sugar and should improve your A1C. It has the additional cardiovascular benefits as we discussed.  ? ? ?Follow-up: 3 months  ? ?Please make sure to arrive 15 minutes prior to your next appointment. If you arrive late, you may be asked to reschedule.  ? ?We look forward to seeing you next time. Please call our clinic at [phone] if you have any questions or concerns. The best time to call is Monday-Friday from 9am-4pm, but there is someone available 24/7. If after hours or the weekend, call the main hospital number and ask for the Internal Medicine Resident On-Call. If you need medication refills, please notify your pharmacy one week in advance and they will send [place] a request. ? ?Thank you for letting us take part in your care. Wishing you the best! ? ? ?

## 2022-02-09 NOTE — Progress Notes (Signed)
? ?Hospital Follow up ? ? Patient ID: Cheyenne Valenzuela, female    DOB: 1955-11-13, 66 y.o.   MRN: [id number]   PCP: Albertha Ghee, MD  ? ?Subjective:  ? ?Cheyenne Valenzuela is a 66 y.o. year old female with hypertension, hyperlipidemia, and diabetes who presents for hospital follow up for stroke. ? ?Hospital summary: admitted 4/28-4/29/23 ?Seen in the [facility] on 4/28 with complaints of headache, dizziness and blurry vision in the left eye. PCP was concerned for stroke and ordered stat head CT which revealed acute right PCA infarct with small hemorrhagic conversion and she was subsequently admitted for ongoing monitoring and workup.  ?Outside the window from tPA. 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  ?-Brain MRI revealed additional right thalamic and bilateral cerebellar small infarcts in embolic pattern.  ?-No thrombus detected on TTE. ?-LDL improved 267>>146 from one month prior ?-No afib was detected on telemetry during admission however was recommended to have 30d cardiac event monitor placed after discharge to r/o afib.  ?Discharged on DAPT asprin 365mg  and plavix 75mg  x3 months followed by asprin alone.  ?Therapy recommended outpatient PT. ?Instructed to f/u at [facility] in 4w. ? ? ?Current Outpatient Medications on File Prior to Visit  ?Medication Sig Dispense Refill  ? albuterol (VENTOLIN HFA) 108 (90 Base) MCG/ACT inhaler Inhale 2 puffs into the lungs every 4 (four) hours as needed for shortness of breath.    ? amLODipine (NORVASC) 10 MG tablet Take 1 tablet (10 mg total) by mouth daily. 30 tablet 11  ? aspirin 325 MG EC tablet Take 1 tablet (325 mg total) by mouth daily. 30 tablet 0  ? atorvastatin (LIPITOR) 80 MG tablet Take 1 tablet (80 mg total) by mouth daily. 100 tablet 2  ? clopidogrel (PLAVIX) 75 MG tablet Take 1 tablet (75 mg total) by mouth daily. 30 tablet 0  ? ezetimibe (ZETIA) 10 MG tablet Take 1 tablet (10 mg total) by mouth daily. 90 tablet 1  ? ibuprofen (ADVIL) 200 MG  tablet Take 200 mg by mouth every 6 (six) hours as needed for mild pain.    ? losartan-hydrochlorothiazide (HYZAAR) 100-25 MG tablet Take 1 tablet by mouth daily. 100 tablet 2  ? metFORMIN (GLUCOPHAGE) 1000 MG tablet Take 1 tablet (1,000 mg total) by mouth 2 (two) times daily with a meal. 200 tablet 2  ? ?No current facility-administered medications on file prior to visit.  ? ? ?Objective:  ? ?BP 137/74 (BP Location: Left Arm, Patient Position: Sitting, Cuff Size: Normal)   Pulse 74   Temp 97.7 ?F (36.5 ?C) (Oral)   Ht 5\' 3"  (1.6 m)   Wt 171 lb 11.2 oz (77.9 kg)   SpO2 100%   BMI 30.42 kg/m?  ?General: Well-appearing female no acute distress ?Cardiac: Heart regular rate and rhythm ?Pulm: Breathing comfortably on room air, lung sounds are clear ? ?Neurologic exam ?Mental status: Alert and oriented x4 ?Speech: No dysarthria or aphasia ?Cranial nerves: 2 through 12 intact.  Mild peripheral vision loss on the left.   ?Motor: 5 out of 5 strength in bilateral upper and lower extremities ?Cerebellar: Finger-to-nose, heel-to-shin normal.  No pronator drift ?Sensory: Sensation intact throughout ?Gait: Normal ?Assessment & Plan:  ? ? ? Hypertension  ?  Blood pressure is fairly good in the office today-137/74.  She denies adverse medication effects and reports adherence. ?- Continue losartan-hydrochlorothiazide and amlodipine ?- Check BMP at next office visit ? ?  ?  ? Type  2 diabetes mellitus with hyperlipidemia (HCC)  ?  Her A1c was noted to be 7.8 during this recent admission.  She reports adherence to metformin 1000 mg twice a day.  She is agreeable to adding Jardiance for the dual glycemic and cardiovascular benefits.  Counseled on adverse effects-she will let us know if she experiences these. ?- Start Jardiance 10 mg daily ?- Follow-up in 3 months for routine diabetic recheck ? ?  ?  ? Hospital follow up for Hx of ischemic right PCA stroke   ?  She presents for hospital follow-up.  Admitted 4/28 - 02/04/2022 for  right PCA and bilateral hemispheric infarcts. ?Continues to experience some blurry vision in the left eye however she reports this is improving.  Denies vertigo.  Denies any other focal symptoms.  Aside from some mild peripheral vision loss on the left, neuro exam is unremarkable. ?Plan ?- She will continue on DAPT for 3 months with Plavix and aspirin followed by aspirin alone. 3 months will be complete 05/05/22 ?- Ambulatory telemetry monitoring has been placed on her ?- She will follow-up with [facility] neurology on June 1 ?- Continue Lipitor and Zetia ?- She does not feel that she needs follow-up with physical therapy however is willing to go to 1 appointment ?- She will follow-up with ophthalmology in August ?- We will continue to work on modifying risk factors ?- We will add Jardiance for dual glycemic and cardiovascular benefits ? ?  ?  ? ?I personally reviewed pertinent imaging and discharge information and performed medication reconciliation. No further imaging or tests are required at this time. Follow up appointments with neurology and PT are scheduled.  ? ?Return in about 3 months (around 05/12/2022). ? ? ?Pt discussed with Dr. Heide Spark ? ?Elige Radon, MD ?Internal Medicine Resident PGY-3 ?[facility] ?02/10/2022 5:20 PM  ?  ?

## 2022-02-10 ENCOUNTER — Encounter: Payer: Self-pay | Admitting: Internal Medicine

## 2022-02-10 NOTE — Assessment & Plan Note (Addendum)
She presents for hospital follow-up.  Admitted 4/28 - 02/04/2022 for right PCA and bilateral hemispheric infarcts. ?Continues to experience some blurry vision in the left eye however she reports this is improving.  Denies vertigo.  Denies any other focal symptoms.  Aside from some mild peripheral vision loss on the left, neuro exam is unremarkable. ?Plan ?- She will continue on DAPT for 3 months with Plavix and aspirin followed by aspirin alone ?- Ambulatory telemetry monitoring has been placed on her ?- She will follow-up with [facility] neurology on June 1 ?- Continue Lipitor and Zetia ?- She does not feel that she needs follow-up with physical therapy however is willing to go to 1 appointment ?- She will follow-up with ophthalmology in August ?- We will continue to work on modifying risk factors ?- We will add Jardiance for dual glycemic and cardiovascular benefits ?

## 2022-02-10 NOTE — Assessment & Plan Note (Signed)
Her A1c was noted to be 7.8 during this recent admission.  She reports adherence to metformin 1000 mg twice a day.  She is agreeable to adding Jardiance for the dual glycemic and cardiovascular benefits.  Counseled on adverse effects-she will let us know if she experiences these. ?- Follow-up in 3 months for routine diabetic recheck ?

## 2022-02-10 NOTE — Assessment & Plan Note (Addendum)
Blood pressure is fairly good in the office today-137/74.  She denies adverse medication effects and reports adherence. ?- Continue losartan-hydrochlorothiazide and amlodipine ?- Check BMP at next office visit ?

## 2022-02-13 NOTE — Progress Notes (Signed)
Internal Medicine Clinic Attending ? ?Case discussed with Dr. Court Joy  At the time of the visit.  We reviewed the resident?s history and exam and pertinent patient test results.  I agree with the assessment, diagnosis, and plan of care documented in the resident?s note. Her findings are concerning for posterior stroke and urgent imaging is appropriate.   ?

## 2022-02-14 ENCOUNTER — Encounter: Payer: Self-pay | Admitting: Physical Therapy

## 2022-02-14 ENCOUNTER — Ambulatory Visit: Payer: Medicare Other | Attending: Internal Medicine | Admitting: Physical Therapy

## 2022-02-14 ENCOUNTER — Ambulatory Visit: Payer: Medicare Other | Admitting: Occupational Therapy

## 2022-02-14 ENCOUNTER — Encounter: Payer: Self-pay | Admitting: Occupational Therapy

## 2022-02-14 DIAGNOSIS — R41842 Visuospatial deficit: Secondary | ICD-10-CM

## 2022-02-14 DIAGNOSIS — I639 Cerebral infarction, unspecified: Secondary | ICD-10-CM | POA: Insufficient documentation

## 2022-02-14 DIAGNOSIS — R279 Unspecified lack of coordination: Secondary | ICD-10-CM | POA: Insufficient documentation

## 2022-02-14 NOTE — Therapy (Signed)
?OUTPATIENT OCCUPATIONAL THERAPY ?NEURO EVALUATION ? ?Patient Name: Cheyenne Valenzuela ?MRN: [id number] ?DOB:1956-08-20, 66 y.o., female ?Today's Date: 02/14/2022 ? ?PCP: Madalyn Rob, MD ?REFERRING PROVIDER: Velna Ochs, MD (hospitalist) ? ? OT End of Session - 02/14/22 0950   ? ? Visit Number 1   ? Date for OT Re-Evaluation 03/16/22   ? Authorization Type [employer]   ? Authorization Time Period VL: [place]   ? OT Start Time [phone]   ? OT Stop Time 1035   ? OT Time Calculation (min) 40 min   ? Activity Tolerance Patient tolerated treatment well   ? Behavior During Therapy [facility] for tasks assessed/performed   ? ?  ?  ? ?  ? ?Past Medical History:  ?Diagnosis Date  ? Cough 06/24/2021  ? Hypercholesteremia   ? Hypertension   ? Influenza A 11/21/2021  ? ?Past Surgical History:  ?Procedure Laterality Date  ? ABDOMINAL HYSTERECTOMY    ? APPENDECTOMY    ? ?Patient Active Problem List  ? Diagnosis Date Noted  ? Hx of ischemic right PCA stroke 02/09/2022  ? Healthcare maintenance 02/04/2021  ? Type 2 diabetes mellitus with hyperlipidemia ([place]) 02/04/2021  ? Intracranial atherosclerosis 02/04/2021  ? Hypertension 01/23/2021  ? Hyperlipidemia 01/23/2021  ? Migraine headache 01/23/2021  ? ? ?ONSET DATE: 02/03/22 ? ?REFERRING DIAG: I63.9 (ICD-10-CM) - Cerebrovascular accident (CVA), unspecified mechanism ([place])  ? ?THERAPY DIAG:  ?Visuospatial deficit ? ?SUBJECTIVE:  ? ?SUBJECTIVE STATEMENT: ?Pt arrives to OP OT evaluation w/ primary concern of "blurred vision" on L side s/p CVA almost 2 weeks ago. Reports she had been having blurry vision, dizziness, and a headache for a few days the week of the 24th, but waited to seek additional medical care until seeing her PCP on that Friday. She was ultimately sent to the hospital w/ CT and MRI indicating R-sided CVA w/ L peripheral visual impairment. Denies concerns w/ functional activities aside from the "blurred vision" she describes. ?Pt accompanied by: self ? ?PERTINENT HISTORY: R PCA  infarct revealed via imaging on 02/03/22 with small hemorrhagic transformation, and R thalamic and bilateral cerebellar small infarcts; L homonymous hemianopsia ? ?PRECAUTIONS: Other: No driving ? ?WEIGHT BEARING RESTRICTIONS No ? ?PAIN:  ?Are you having pain? No ? ?FALLS: Has patient fallen in last 6 months? No ? ?LIVING ENVIRONMENT: ?Lives with: lives with their family ?Lives in: House/apartment ?Stairs: Yes: Internal: 10-15 steps; not asked and External: 4 steps; not asked ?Has following equipment at home: None ? ?PLOF: Independent, Vocation/Vocational requirements: working part-time, and Leisure: gardening, active in [employer] ? ?PATIENT GOALS "I don't know;" "my only concern is blurred vision" ? ?OBJECTIVE:  ? ?HAND DOMINANCE: Left ? ?ADLs: ?Overall ADLs: Mod I-Ind w/ all BADLs per pt report; no DME or AE in the home ? ?IADLs: ?Shopping:  ?Light housekeeping: Mod I ?Meal Prep: Mod I ?Community mobility: Was driving prior; no driving currently, per MD ?Medication management: Mod I ?Financial management: not asked ?Handwriting:  WFL, per pt report ? ?MOBILITY STATUS: Independent ? ?ACTIVITY TOLERANCE: ?Activity tolerance: No concerns, per pt report ? ?COORDINATION: ?Finger Nose Finger test: WNL bilaterally ? ?SENSATION: ?Reports some numbness in R index finger that was present prior to CVA ? ?COGNITION: ?Overall cognitive status: Within functional limits for tasks assessed ? ?VISION: ?Subjective report: "Blurred vision;" states this is on her L side only. Reports consistency w/ annual eye exams prior to onset ?Baseline vision: Wears glasses for reading only ?Visual history:  No significant history ? ?VISION  ASSESSMENT: ?Reading acuity: WFL; approx 20/30 ?Ocular ROM: WFL ?Gaze preference/alignment: WDL ?Tracking/Visual pursuits: Able to track stimulus in all quads without difficulty ?Saccades: WFL ?Convergence: WFL ?Visual Fields: Left visual field deficits ?Symbol Cancellation: 12/24 during first attempt  demonstrating disorganized search pattern; OT provided education regarding following a search pattern, providing verbal cues and visual modeling, and pt was able to identify 24/24 targets in 2nd attempt w/out assist ? ?Patient has difficulty with following activities due to visual impairments: Reading ? ? ?OBSERVATIONS: Observed very slight rotation of head to R side when sitting directly in front of pt ? ? ?PATIENT EDUCATION: ?Education details: Education provided on role and purpose of OT in OP setting. Also provided pertinent condition-specific education; introduced visual compensatory strategies, including use of line guide, organized search pattern, high contrast, and compensatory head movements to capture more complete visual field (handout administered); and discussed recognition of stroke signs and symptoms w/ pt verbalizing understanding. ?Person educated: Patient ?Education method: Explanation, Demonstration, and Handouts ?Education comprehension: verbalized understanding and returned demonstration ? ? ?HOME EXERCISE PROGRAM: ?None ? ? ?GOALS: ?Goals reviewed with patient? Yes ? ?SHORT TERM GOALS: Target date: 02/28/2022 ? ?Pt will demonstrate understanding of at least 2 visual compensatory strategies to improve participation and safety w/ IADLs including reading and functional mobility ?Baseline: Decreased knowledge of visual compensatory strategies ?Goal status: MET - 02/14/22 ? ?Pt will verbalize understanding of role/purpose of occupational therapy services and be able to independently identify if/when she may need to seek an add'l referral for services to assist w/ participation in functional activities ?Baseline:  ?Goal status: MET - 02/14/22 ? ? ?ASSESSMENT: ? ?CLINICAL IMPRESSION: ?Pt is a 66 y/o female who presents to OP OT due to visual impairment s/p R PCA CVA on 02/03/22. 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 Pt demonstrates L visual field deficit w/ OT providing relevant condition-specific education and education related to visual compensatory strategies; pt verbalized understanding and returned demonstration of techniques w/out difficulty. Due to reported absence of functional deficits and plan to follow-up w/ neurology and optometry, further skilled occupational therapy services are not warranted at this time. OT discussed this w/ pt and she was receptive to plan at this time. Pt to be put on-hold to allow time to practice compensatory strategies and return to OP therapies if needed. Pt also encouraged to call back with any specific changes or development of limitations during functional activities. ? ?PERFORMANCE DEFICITS in functional skills including IADLs and vision and psychosocial skills including environmental adaptation.  ? ?IMPAIRMENTS are limiting patient from IADLs, leisure, and social participation.  ? ?COMORBIDITIES may have co-morbidities  that affects occupational performance. Patient will benefit from skilled OT to address above impairments and improve overall function. ? ?MODIFICATION OR ASSISTANCE TO COMPLETE EVALUATION: Min-Moderate modification of tasks or assist with assess necessary to complete an evaluation. ? ?OT OCCUPATIONAL PROFILE AND HISTORY: Problem focused assessment: Including review of records relating to presenting problem. ? ?CLINICAL DECISION MAKING: LOW - limited treatment options, no task modification necessary ? ?REHAB POTENTIAL: Fair - dependent on neuro recovery ? ?EVALUATION COMPLEXITY: Low ? ? ?PLAN: ?OT FREQUENCY: one time visit ? ?PLANNED INTERVENTIONS: self care/ADL training, patient/family education, and visual/perceptual remediation/compensation ? ?RECOMMENDED OTHER SERVICES: Optometry / neurophthalmology ? ?CONSULTED AND AGREED WITH PLAN OF CARE: Patient ? ?PLAN FOR NEXT SESSION: N/A ? ? ?Kathrine Cords, MSOT, OTR/L ?02/14/2022, 2:06 PM ?

## 2022-02-14 NOTE — Addendum Note (Signed)
Addended by: Milinda Pointer on: 02/14/2022 10:04 AM ? ? Modules accepted: Orders ? ?

## 2022-02-14 NOTE — Therapy (Signed)
[facility] [place] ?[place] ?[address] ?[place], [place], [address] ?Phone: [phone]   Fax:  [phone] ? ?Physical Therapy Evaluation ? ?Patient Details  ?Name: Cheyenne Valenzuela ?MRN: [id number] ?Date of Birth: 01/08/1956 ?Referring Provider (PT): Reina Fuse ? ? ?Encounter Date: 02/14/2022 ? ? PT End of Session - 02/14/22 1000   ? ? Visit Number 1   ? Number of Visits 1   ? Authorization Type [employer]   ? Authorization Time Period 02/14/22 to 02/14/22   ? PT Start Time [phone]   ? PT Stop Time [phone]   ? PT Time Calculation (min) 17 min   ? Activity Tolerance Patient tolerated treatment well   ? Behavior During Therapy [facility] for tasks assessed/performed   ? ?  ?  ? ?  ? ? ?Past Medical History:  ?Diagnosis Date  ? Cough 06/24/2021  ? Hypercholesteremia   ? Hypertension   ? Influenza A 11/21/2021  ? ? ?Past Surgical History:  ?Procedure Laterality Date  ? ABDOMINAL HYSTERECTOMY    ? APPENDECTOMY    ? ? ?There were no vitals filed for this visit. ? ? ? Subjective Assessment - 02/14/22 0935   ? ? Subjective I had a stroke on April 30th, the biggest issue was loss of vision on my left its still blurred but better than in the beginning. The doctor told me this would get better, referred me to see an eye doctor. I feel like my strength and balance is what it was before the stroke, I'm not having any problems.   ? Patient Stated Goals make sure things are good to go   ? Currently in Pain? No/denies   ? ?  ?  ? ?  ? ? ? ? ? [facility] PT Assessment - 02/14/22 0001   ? ?  ? Assessment  ? Medical Diagnosis CVA   ? Referring Provider (PT) Velna Ochs   ? Onset Date/Surgical Date 02/05/22   ? Next MD Visit PCP in 3 months   ? Prior Therapy PT in the hospital   ?  ? Precautions  ? Precautions Other (comment)   ? Precaution Comments vision impairment L side of vision   ?  ? Restrictions  ? Weight Bearing Restrictions No   ?  ? Balance Screen  ? Has the patient fallen in the past 6 months No   ? Has the  patient had a decrease in activity level because of a fear of falling?  No   ? Is the patient reluctant to leave their home because of a fear of falling?  No   ?  ? Home Environment  ? Living Environment Private residence   ? Living Arrangements Children   ?  ? Prior Function  ? Level of Independence Independent;Independent with basic ADLs;Independent with gait;Independent with transfers   ? Vocation Retired   ? Leisure gardening   ?  ? ROM / Strength  ? AROM / PROM / Strength Strength   ?  ? Strength  ? Strength Assessment Site Hip;Knee;Ankle   ? Right/Left Hip Left;Right   ? Right Hip Flexion 4+/5   ? Right Hip Extension 4+/5   ? Right Hip ABduction 4+/5   ? Left Hip Flexion 4+/5   ? Left Hip Extension 4+/5   ? Left Hip ABduction 4+/5   ? Right/Left Knee Right;Left   ? Right Knee Flexion 4+/5   ? Right Knee Extension 5/5   ? Left Knee Flexion  4+/5   ? Left Knee Extension 5/5   ? Right/Left Ankle Left;Right   ? Right Ankle Dorsiflexion 5/5   ? Left Ankle Dorsiflexion 5/5   ?  ? Standardized Balance Assessment  ? Standardized Balance Assessment Dynamic Gait Index   ?  ? Dynamic Gait Index  ? Level Surface Normal   ? Change in Gait Speed Normal   ? Gait with Horizontal Head Turns Normal   ? Gait with Vertical Head Turns Normal   ? Gait and Pivot Turn Normal   ? Step Over Obstacle Normal   ? Step Around Obstacles Mild Impairment   ? Steps Normal   ? Total Score 23   ? ?  ?  ? ?  ? ? ? ? ? ? ? ? ? ? ? ? ? ?Objective measurements completed on examination: See above findings.  ? ? ? ? ? ? ? ? ? ? ? ? ? ? PT Education - 02/14/22 0959   ? ? Education Details exam findings no need for skilled PT services   ? Person(s) Educated Patient   ? Methods Explanation   ? Comprehension Verbalized understanding   ? ?  ?  ? ?  ? ? ? PT Short Term Goals - 02/14/22 1001   ? ?  ? PT SHORT TERM GOAL #1  ? Title Will complete PT evaluation   ? Time 1   ? Period Days   ? Status Achieved   ? ?  ?  ? ?  ? ? ? ? ? ? ? ? ? ? ? ? Plan - 02/14/22  1000   ? ? Clinical Impression Statement Brittainy arrives today doing well, feeling better after being discharged from the hospital and primary complaint is ongoing blurring in left visual field. She is at her functional baseline for strength, balance and gait. No drifting L or running into items on the L today. No need for skilled PT services thank you for the referral!   ? Personal Factors and Comorbidities Time since onset of injury/illness/exacerbation   ? Examination-Activity Limitations Other   vision  ? Examination-Participation Restrictions Driving   ? Stability/Clinical Decision Making Stable/Uncomplicated   ? Clinical Decision Making Low   ? Rehab Potential Excellent   ? PT Frequency One time visit   ? PT Duration Other (comment)   eval only  ? PT Treatment/Interventions ADLs/Self Care Home Management   ? PT Next Visit Plan eval only thank you for the referral!   ? Recommended Other Services OT for vision interventions   ? Consulted and Agree with Plan of Care Patient   ? ?  ?  ? ?  ? ? ?Patient will benefit from skilled therapeutic intervention in order to improve the following deficits and impairments:  Decreased coordination ? ?Visit Diagnosis: ?Unspecified lack of coordination ? ? ? ? ?Problem List ?Patient Active Problem List  ? Diagnosis Date Noted  ? Hx of ischemic right PCA stroke 02/09/2022  ? Healthcare maintenance 02/04/2021  ? Type 2 diabetes mellitus with hyperlipidemia ([place]) 02/04/2021  ? Intracranial atherosclerosis 02/04/2021  ? Hypertension 01/23/2021  ? Hyperlipidemia 01/23/2021  ? Migraine headache 01/23/2021  ? ?Therasa Lorenzi U PT, DPT, PN2  ? ?Supplemental Physical Therapist ?[place]  ? ? ? ? ? ?[place] ?[address] ?[address] ?[place], [place], [address] ?Phone: [phone]   Fax:  [phone] ? ?Name: Cheyenne Valenzuela ?MRN: [id number] ?Date of Birth: 07/15/1956 ? ? ?

## 2022-02-15 NOTE — Progress Notes (Signed)
Internal Medicine Clinic Attending  Case discussed with Dr. Christian  At the time of the visit.  We reviewed the resident's history and exam and pertinent patient test results.  I agree with the assessment, diagnosis, and plan of care documented in the resident's note.  

## 2022-02-27 ENCOUNTER — Telehealth: Payer: Self-pay

## 2022-02-27 DIAGNOSIS — I63531 Cerebral infarction due to unspecified occlusion or stenosis of right posterior cerebral artery: Secondary | ICD-10-CM

## 2022-02-27 NOTE — Telephone Encounter (Signed)
   Cardiac Monitor Alert  Date of alert:  02/27/2022   Patient Name: Cheyenne Valenzuela  DOB: [birth year]  MRN: [id number]   [facility] Cardiologist: Dr. Radford Pax (new patient) [facility] EP:  None    Monitor Information: Cardiac Event Monitor [[employer]]  Reason:  Stroke -Recommend 30-day cardiac event monitor as outpatient to rule out A-fib Ordering provider: Dr. Erlinda Hong- Neurology,  Roby Lofts PA for Dr. Radford Pax- Cardiology  "Medicine team requested 30-day monitor to further evaluate etiology of her stroke.  She does not follow with [facility].  We will place order for Dr. Radford Pax to read monitor results.  Will ask scheduling to arrange a follow-up appointment in 2 months to review those results."   Abigail Butts, PA-C 02/05/22; 12:56 PM { Alert Ventricular Tachycardia This is the 1st alert for this rhythm.   Next Cardiology Appointment :1}  Date:  04/15/22  Provider:  Dr. Radford Pax  The patient could NOT be reached by telephone today.  02/27/22 Left message for patient to call back. Arrhythmia, symptoms and history reviewed with Dr. Ali Lowe.    Plan:  When patient calls back, schedule her for a nurse visit to check  BP and HR. Depending on results from nurse visit, start patient on Toprol XL 25 mg or 12.5 mg.    Michaelyn Barter, RN  02/27/2022 8:12 AM

## 2022-02-27 NOTE — Telephone Encounter (Signed)
Patient called back. Made patient a nurse visit on Friday for BP and HR check. Will send message to Dr. Radford Pax to let her know.

## 2022-03-03 ENCOUNTER — Telehealth: Payer: Self-pay

## 2022-03-03 ENCOUNTER — Ambulatory Visit (INDEPENDENT_AMBULATORY_CARE_PROVIDER_SITE_OTHER): Payer: Medicare Other

## 2022-03-03 VITALS — BP 120/64 | HR 58 | Ht 63.0 in | Wt 170.0 lb

## 2022-03-03 DIAGNOSIS — I63531 Cerebral infarction due to unspecified occlusion or stenosis of right posterior cerebral artery: Secondary | ICD-10-CM | POA: Diagnosis not present

## 2022-03-03 NOTE — Patient Instructions (Signed)
Medication Instructions:  Your physician recommends that you continue on your current medications as directed. Please refer to the Current Medication list given to you today.  *If you need a refill on your cardiac medications before your next appointment, please call your pharmacy*   Lab Work: None ordered.  If you have labs (blood work) drawn today and your tests are completely normal, you will receive your results only by: MyChart Message (if you have MyChart) OR A paper copy in the mail If you have any lab test that is abnormal or we need to change your treatment, we will call you to review the results.   Testing/Procedures: None ordered.    Follow-Up: At [facility], you and your health needs are our priority.  As part of our continuing mission to provide you with exceptional heart care, we have created designated Provider Care Teams.  These Care Teams include your primary Cardiologist (physician) and Advanced Practice Providers (APPs -  Physician Assistants and Nurse Practitioners) who all work together to provide you with the care you need, when you need it.  We recommend signing up for the patient portal called "MyChart".  Sign up information is provided on this After Visit Summary.  MyChart is used to connect with patients for Virtual Visits (Telemedicine).  Patients are able to view lab/test results, encounter notes, upcoming appointments, etc.  Non-urgent messages can be sent to your provider as well.   To learn more about what you can do with MyChart, go to [email or web address].    Your next appointment:   As scheduled with Dr Mayford Knife  Important Information About Sugar

## 2022-03-03 NOTE — Progress Notes (Signed)
   Nurse Visit   Date of Encounter: 03/03/2022 ID: Elisheba Mcdonnell, DOB 22-Sep-1956, MRN [id number]  PCP:  Albertha Ghee, MD   [facility] Providers Cardiologist:  Appointment scheduled with Dr Armanda Magic  04/25/2022   Visit Details   VS:  BP 120/64 (BP Location: Left Arm, Patient Position: Sitting, Cuff Size: Large)   Pulse (!) 58   Ht 5\' 3"  (1.6 m)   Wt 170 lb (77.1 kg)   BMI 30.11 kg/m  , BMI Body mass index is 30.11 kg/m.  Wt Readings from Last 3 Encounters:  03/03/22 170 lb (77.1 kg)  02/09/22 171 lb 11.2 oz (77.9 kg)  02/03/22 170 lb (77.1 kg)     Reason for visit: Visit for BP and HR monitoring Performed today: Vital Signs, Consult with Dr 02/05/22, DOD and Education Changes (medications, testing, etc.) : No changes per Dr Ladona Ridgel Length of Visit: 30 minutes    Medications Adjustments/Labs and Tests Ordered:  None ordered.  Pt presents today at the request of Dr Ladona Ridgel for BP and HR check due to recent monitor alert for VT.  Pt diagnosed with cryptogenic stroke 3 weeks ago per pt and monitor ordered by her PCP. HR and BP reviewed with Dr Lynnette Caffey who recommends pt continue monitoring and continue current medications.  Keep appointment as scheduled with Dr Ladona Ridgel in July.    Signed, August, RN  03/03/2022 4:02 PM

## 2022-03-03 NOTE — Telephone Encounter (Addendum)
   Cardiac Monitor Alert  Date of alert:  03/03/2022   Patient Name: Cheyenne Valenzuela  DOB: 1956/06/16  MRN: [id number]   [facility] Cardiologist: None  [facility] EP:  None    Monitor Information: Cardiac Event Monitor [[employer]]  Reason:  Cerebral Infarction, Dizziness and Giddiness, Unspecified Atrial Fib Ordering provider:  Marvel Plan   Alert Atrial Fibrillation/Flutter This is the 1st alert for this rhythm.  The patient has no hx of Atrial Fibrillation/Flutter.  The patient is not currently on anticoagulation.  Next Cardiology Appointment   Date:  04/25/2022  Provider:  Dr Mayford Knife  The patient could NOT be reached by telephone today.  Attempted phone call to pt and left voicemail message to contact [facility] at [phone]. Arrhythmia, symptoms and history reviewed with Dr Ladona Ridgel .  Plan:  Continue monitoring and try to reach pt  to verify any symptoms.  Other: Pt returned phone call - Spoke with pt who recalls no symptoms at time of monitor alert 03/02/2022 at 8:09pm -CT States she is feeling fine today.  She is scheduled today for a nurse visit in this office for a BP and HR check.  Pt verbalizes understanding and thanked Charity fundraiser for the call.  Alois Cliche, RN  03/03/2022 10:56 AM

## 2022-03-07 ENCOUNTER — Telehealth: Payer: Self-pay | Admitting: Cardiology

## 2022-03-07 NOTE — Telephone Encounter (Signed)
Patient wanted to know how much longer she needs to wear the heart monitor

## 2022-03-07 NOTE — Telephone Encounter (Signed)
Spoke with patient about her heart monitor.  Patient states she applied heart monitor on 02/07/22. Monitor is ordered for 30 days, patient may remove monitor on Thursday 03/09/22 and mail back.  Patient verbalized understanding and expressed appreciation for call.

## 2022-03-08 NOTE — Progress Notes (Unsigned)
[facility] [address] [place].  [address] [phone]       HOSPITAL FOLLOW UP NOTE  [name] Date of Birth:  06-27-1956 Medical Record Number:  [id number]   Reason for Referral:  hospital stroke follow up   SUBJECTIVE:   CHIEF COMPLAINT:  Chief Complaint  Patient presents with   New Patient (Initial Visit)    Rm 10, alone. Here to f/u from recent hospital visit due to stroke. Pt reports doing well.     HPI:   Cheyenne Valenzuela is a 66 y.o. who  has a past medical history of Cough (06/24/2021), Hypercholesteremia, Hypertension, Influenza A (11/21/2021), and Stroke [facility]).  Patient presented on 02/03/2022 with blurry vision, headache and poor balance for about a week. She waited for appt with PCP and was directed to the ER. CT suggested acute right PCA stroke. MRI confirmed. She was outside the window for tPA. Symptoms improved over the next 24 hours. PT/OT eval recommended outpatient therapy. She was discharged on DAPT with asa 325mg  and Plavix for 3 months.  Personally reviewed hospitalization pertinent progress notes, lab work and imaging.  Evaluated by Dr .   Since discharge, she reports that she "is fine". She feels that she is back to baseline. She continues to have left sided blurry vision. She is planning to see ophthalmology in July. She has not driven since stroke due to feeling uneasy about vision deficit. Her youngest daughter lives at home with her. Her daughter, Cheyenne Valenzuela, helps with transportation for MD visits. Patient does not work. She is able to perform ADLs independently.   She was seen by PCP 02/09/2022. BP well managed. Advised to continue losartan-HCTZ and amlodipine. She keeps a daily BP log. Home readings usually around 140/70's. She was started on Jardiance and metformin continued. Outpatient PT/OT evaluation 02/14/2022 did not exhibit any functional deficits. No further therapy advised. She is followed by cardiology for outpatient heart  monitoring. Monitor alert 03/03/2022 for atrial fib/flutter. Cheyenne Valenzuela was contacted but not experiencing symptoms. She reports cleaning the house around the time of the event. She was advised to continue monitoring. She is scheduled to return monitor today. Appt scheduled with Dr Andrey Campanile in 04/2022.   She walks about a mile every other day for exercise.   PERTINENT IMAGING/LABS  MRI right PCA, right thalamic, bilateral cerebellum infarcts with small right PCA hemorrhagic transformation. 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  Left fetal PCA. 2D Echo EF 60 to 65%, no PFO   A1C Lab Results  Component Value Date   HGBA1C 7.8 (H) 02/04/2022    Lipid Panel     Component Value Date/Time   CHOL 226 (H) 02/04/2022 0334   CHOL 217 (H) 01/10/2022 1525   TRIG 236 (H) 02/04/2022 0334   HDL 33 (L) 02/04/2022 0334   HDL 32 (L) 01/10/2022 1525   CHOLHDL 6.8 02/04/2022 0334   VLDL 47 (H) 02/04/2022 0334   LDLCALC 146 (H) 02/04/2022 0334   LDLCALC 149 (H) 01/10/2022 1525   LABVLDL 36 01/10/2022 1525      ROS:   14 system review of systems performed and negative with exception of those listed in HPI  PMH:  Past Medical History:  Diagnosis Date   Cough 06/24/2021   Hypercholesteremia    Hypertension    Influenza A 11/21/2021   Stroke (HCC)     PSH:  Past Surgical History:  Procedure Laterality Date   ABDOMINAL HYSTERECTOMY  APPENDECTOMY      Social History:  Social History   Socioeconomic History   Marital status: Single    Spouse name: Not on file   Number of children: 3   Years of education: Not on file   Highest education level: Some college, no degree  Occupational History   Occupation: Retired  Tobacco Use   Smoking status: Never    Passive exposure: Never   Smokeless tobacco: Never  Substance and Sexual Activity   Alcohol use: No   Drug use: Never   Sexual activity: Not on file  Other Topics Concern   Not on  file  Social History Narrative   Lives w daughter   Left handed   Caffeine: rarely, mainly juice   Social Determinants of Health   Financial Resource Strain: Not on file  Food Insecurity: Not on file  Transportation Needs: Not on file  Physical Activity: Not on file  Stress: Not on file  Social Connections: Not on file  Intimate Partner Violence: Not on file    Family History:  Family History  Problem Relation Age of Onset   Diabetes Mother    Diabetes Sister     Medications:   Current Outpatient Medications on File Prior to Visit  Medication Sig Dispense Refill   albuterol (VENTOLIN HFA) 108 (90 Base) MCG/ACT inhaler Inhale 2 puffs into the lungs every 4 (four) hours as needed for shortness of breath.     amLODipine (NORVASC) 10 MG tablet Take 1 tablet (10 mg total) by mouth daily. 30 tablet 11   aspirin 325 MG EC tablet Take 1 tablet (325 mg total) by mouth daily. 30 tablet 0   atorvastatin (LIPITOR) 80 MG tablet Take 1 tablet (80 mg total) by mouth daily. 100 tablet 2   empagliflozin (JARDIANCE) 10 MG TABS tablet Take 1 tablet (10 mg total) by mouth daily before breakfast. 90 tablet 1   ezetimibe (ZETIA) 10 MG tablet Take 1 tablet (10 mg total) by mouth daily. 90 tablet 1   ibuprofen (ADVIL) 200 MG tablet Take 200 mg by mouth every 6 (six) hours as needed for mild pain.     losartan-hydrochlorothiazide (HYZAAR) 100-25 MG tablet Take 1 tablet by mouth daily. 100 tablet 2   metFORMIN (GLUCOPHAGE) 1000 MG tablet Take 1 tablet (1,000 mg total) by mouth 2 (two) times daily with a meal. 200 tablet 2   No current facility-administered medications on file prior to visit.    Allergies:   Allergies  Allergen Reactions   Lisinopril Swelling      OBJECTIVE:  Physical Exam  Vitals:   03/09/22 0806  BP: (!) 161/82  Pulse: 83  Weight: 170 lb 8 oz (77.3 kg)  Height: 5\' 3"  (1.6 m)   Body mass index is 30.2 kg/m. No results found.     02/09/2022    9:55 AM  Depression  screen PHQ 2/9  Decreased Interest 3  Down, Depressed, Hopeless 0  PHQ - 2 Score 3  Altered sleeping 0  Tired, decreased energy 1  Change in appetite 1  Feeling bad or failure about yourself  0  Trouble concentrating 0  Moving slowly or fidgety/restless 0  Suicidal thoughts 0  PHQ-9 Score 5  Difficult doing work/chores Not difficult at all     General: well developed, well nourished, seated, in no evident distress Head: head normocephalic and atraumatic.   Neck: supple with no carotid or supraclavicular bruits Cardiovascular: regular rate and rhythm, no murmurs Musculoskeletal:  no deformity Skin:  no rash/petichiae Vascular:  Normal pulses all extremities   Neurologic Exam Mental Status: Awake and fully alert.  Fluent speech and language.  Oriented to place and time. Recent and remote memory intact. Attention span, concentration and fund of knowledge appropriate. Mood and affect appropriate.  Cranial Nerves: Fundoscopic exam reveals sharp disc margins. Pupils equal, briskly reactive to light. Extraocular movements full without nystagmus. Visual fields full to confrontation with exception of left quadrantanopia. Hearing intact. Facial sensation intact. Face, tongue, palate moves normally and symmetrically.  Motor: Normal bulk and tone. Normal strength in all tested extremity muscles Sensory.: intact to touch , pinprick , position and vibratory sensation.  Coordination: Rapid alternating movements normal in all extremities. Finger-to-nose and heel-to-shin performed accurately bilaterally. Gait and Station: Arises from chair without difficulty. Stance is normal. Gait demonstrates normal stride length and balance with no assistive device.  Reflexes: 1+ and symmetric.    NIHSS  1 Modified Rankin  0   ASSESSMENT: Cheyenne Valenzuela is a 66 y.o. year old female presenting to the ER 02/03/2022 with blurred vision, headache and poor balance.  Vascular risk factors include HTN, HLD, DMT2  uncontrolled, advanced age, obesity.    PLAN:  Stroke:  right PCA infarct with small hemorrhagic transformation, and right thalamic and bilateral cerebellar small infarcts, embolic pattern secondary to cardioembolic source versus posterior circulation large vessel disease with uncontrolled risk factors : Residual deficit: left sided blurred vision. Continue aspirin 325 mg daily and clopidogrel 75 mg daily  and Lipitor 80mg  and Zetia 10mg  daily for secondary stroke prevention.  Discussed secondary stroke prevention measures and importance of close PCP follow up for aggressive stroke risk factor management. I have gone over the pathophysiology of stroke, warning signs and symptoms, risk factors and their management in some detail with instructions to go to the closest emergency room for symptoms of concern. HTN: BP goal <130/90. Elevated, today 161/82. Home readings around 140/70's. Continue BP log at home. Continue losartan/HCTZ 100-25mg  per PCP HLD: LDL goal <70. Recent LDL 146, down from 267 in 7/82953/2023. Continue Lipitor 80mg  and zetia 10mg  daily per PCP.   DMII: A1c goal<7.0. Recent A1c 7.8. Recently started on Jardiance and metformin continued. Follow up closely with PCP for management.  Left quadrantanopia: follow up with ophthalmology as scheduled Heart Monitoring: follow up closely with Dr Mayford Knifeurner, cardiology. Alert 5/26, possible afib/flutter Obesity: continue regular physical exercise and low carb diet. Goad BMI 25.    Follow up in 6 or call earlier if needed   CC:  [facility] provider: Dr. Pearlean BrownieSethi PCP: Albertha GheeSteen, James, MD    I spent 45 minutes of face-to-face and non-face-to-face time with patient.  This included previsit chart review including review of recent hospitalization, lab review, study review, order entry, electronic health record documentation, patient education regarding recent stroke including etiology, secondary stroke prevention measures and importance of managing stroke risk factors,  residual deficits and typical recovery time and answered all other questions to patient satisfaction   Shawnie DapperAmy Nissan Frazzini, [facility] HospitalFNP-C  [facility] [address] [place], [place] [id number]  Phone [phone] Fax [phone] Note: This document was prepared with digital dictation and possible smart phrase technology. Any transcriptional errors that result from this process are unintentional.

## 2022-03-08 NOTE — Patient Instructions (Signed)
Below is our plan:  Stroke:  right PCA infarct with small hemorrhagic transformation, and right thalamic and bilateral cerebellar small infarcts, embolic pattern secondary to cardioembolic source versus posterior circulation large vessel disease with uncontrolled risk factors : Residual deficit: left sided blurred vision. Continue aspirin 325 mg daily and clopidogrel 75 mg daily  and Lipitor 80mg  and Zetia 10mg  daily for secondary stroke prevention.  Discussed secondary stroke prevention measures and importance of close PCP follow up for aggressive stroke risk factor management. I have gone over the pathophysiology of stroke, warning signs and symptoms, risk factors and their management in some detail with instructions to go to the closest emergency room for symptoms of concern. HTN: BP goal <130/90. Elevated, today 161/82. Home readings around 140/70's. Continue BP log at home. Continue losartan/HCTZ 100-25mg  per PCP HLD: LDL goal <70. Recent LDL 146, down from 267 in . Continue Lipitor 80mg  and zetia 10mg  daily per PCP.   DMII: A1c goal<7.0. Recent A1c 7.8. Recently started on Jardiance and metformin continued. Follow up closely with PCP for management.  Heart Monitoring: follow up closely with Dr [id number], cardiology. Alert 5/26, possible afib/flutter Obesity: continue regular physical exercise and low carb diet. Goad BMI 25.   Please make sure you are staying well hydrated. I recommend 50-60 ounces daily. Well balanced diet and regular exercise encouraged. Consistent sleep schedule with 6-8 hours recommended.   Please continue follow up with care team as directed.   Follow up with me in 6 months  You may receive a survey regarding today's visit. I encourage you to leave honest feed back as I do use this information to improve patient care. Thank you for seeing me today!

## 2022-03-09 ENCOUNTER — Ambulatory Visit: Payer: Medicare Other | Admitting: Family Medicine

## 2022-03-09 ENCOUNTER — Encounter: Payer: Self-pay | Admitting: Family Medicine

## 2022-03-09 VITALS — BP 161/82 | HR 83 | Ht 63.0 in | Wt 170.5 lb

## 2022-03-09 DIAGNOSIS — E785 Hyperlipidemia, unspecified: Secondary | ICD-10-CM

## 2022-03-09 DIAGNOSIS — I63531 Cerebral infarction due to unspecified occlusion or stenosis of right posterior cerebral artery: Secondary | ICD-10-CM | POA: Diagnosis not present

## 2022-03-09 DIAGNOSIS — E1169 Type 2 diabetes mellitus with other specified complication: Secondary | ICD-10-CM

## 2022-03-09 DIAGNOSIS — E669 Obesity, unspecified: Secondary | ICD-10-CM

## 2022-03-09 DIAGNOSIS — I10 Essential (primary) hypertension: Secondary | ICD-10-CM

## 2022-03-09 NOTE — Progress Notes (Signed)
I agree with the above plan 

## 2022-03-13 DIAGNOSIS — Z0289 Encounter for other administrative examinations: Secondary | ICD-10-CM

## 2022-03-16 ENCOUNTER — Other Ambulatory Visit (HOSPITAL_COMMUNITY): Payer: Self-pay

## 2022-03-16 ENCOUNTER — Telehealth: Payer: Self-pay | Admitting: *Deleted

## 2022-03-16 NOTE — Telephone Encounter (Signed)
Gave completed/signed FMLA back to medical records to process.  FMLA for daughter, Glee Arvin to bring mother to and from appt. Provided 2 appt/year (2-3hr per appt).

## 2022-03-20 ENCOUNTER — Other Ambulatory Visit (HOSPITAL_COMMUNITY): Payer: Self-pay

## 2022-03-20 ENCOUNTER — Telehealth: Payer: Self-pay | Admitting: *Deleted

## 2022-03-20 NOTE — Telephone Encounter (Signed)
Gave the patient Atrial Fibrillation education and discussed finding on heart monitor. Made appt with the AF Clinic.   AFIB CLINIC INFORMATION: Your appointment is scheduled on: 03/21/22 at 9 am . Please arrive 15 minutes early for check-in. The AFib Clinic is located in the Heart and Vascular Specialty Clinics at [facility]. Parking instructions/directions: Government social research officer [address] ([address]). When you pull in to Entrance C, there is an underground parking garage to your right. The code to enter the garage is 1004. Take the elevators to the first floor. Follow the signs to the Heart and Vascular Specialty Clinics. You will see registration at the end of the hallway.  Phone number: [phone]  Patient verbalized understanding and agreement.

## 2022-03-20 NOTE — Telephone Encounter (Signed)
-----   Message from Lanier Prude, MD sent at 03/19/2022  7:20 PM EDT ----- Cheyenne Valenzuela/Stacy,  Please refer the patient to the AF clinic for anticoagulation initiation for AF in a cryptogenic stroke patient.  Thanks, Steffanie Dunn      ----- Message ----- From: Quintella Reichert, MD Sent: 03/19/2022   6:48 PM EDT To: Shawnie Dapper, NP; Elige Radon, MD; #  Sheria Lang I have not seen this patient. She is set up to see you as a new patient in July ----- Message ----- From: Lanier Prude, MD Sent: 03/19/2022   6:38 PM EDT To: Quintella Reichert, MD; Shawnie Dapper, NP; #  AF on monitor in patient w/ history of cryptogenic stroke. Recommend anticoagulation.  Thanks, Steffanie Dunn

## 2022-03-21 ENCOUNTER — Other Ambulatory Visit (HOSPITAL_COMMUNITY): Payer: Self-pay

## 2022-03-21 ENCOUNTER — Encounter (HOSPITAL_COMMUNITY): Payer: Self-pay | Admitting: Nurse Practitioner

## 2022-03-21 ENCOUNTER — Ambulatory Visit (HOSPITAL_COMMUNITY)
Admission: RE | Admit: 2022-03-21 | Discharge: 2022-03-21 | Disposition: A | Discharge status: Home / Self Care | Payer: Medicare Other | Source: Ambulatory Visit | Attending: Nurse Practitioner | Admitting: Nurse Practitioner

## 2022-03-21 VITALS — BP 176/86 | HR 68 | Ht 63.0 in | Wt 172.0 lb

## 2022-03-21 DIAGNOSIS — I119 Hypertensive heart disease without heart failure: Secondary | ICD-10-CM | POA: Diagnosis not present

## 2022-03-21 DIAGNOSIS — Z8673 Personal history of transient ischemic attack (TIA), and cerebral infarction without residual deficits: Secondary | ICD-10-CM | POA: Insufficient documentation

## 2022-03-21 DIAGNOSIS — H538 Other visual disturbances: Secondary | ICD-10-CM | POA: Diagnosis not present

## 2022-03-21 DIAGNOSIS — I4891 Unspecified atrial fibrillation: Secondary | ICD-10-CM | POA: Insufficient documentation

## 2022-03-21 DIAGNOSIS — R519 Headache, unspecified: Secondary | ICD-10-CM | POA: Diagnosis not present

## 2022-03-21 DIAGNOSIS — E119 Type 2 diabetes mellitus without complications: Secondary | ICD-10-CM | POA: Diagnosis not present

## 2022-03-21 DIAGNOSIS — E785 Hyperlipidemia, unspecified: Secondary | ICD-10-CM | POA: Diagnosis not present

## 2022-03-21 MED ORDER — APIXABAN 5 MG PO TABS
5.0000 mg | ORAL_TABLET | Freq: Two times a day (BID) | ORAL | 11 refills | Status: DC
  Filled 2022-03-21: qty 60, 30d supply, fill #0

## 2022-03-21 NOTE — Patient Instructions (Signed)
Stop Aspirin   Start Eliquis 5mg  Twice Daily

## 2022-03-21 NOTE — Progress Notes (Signed)
Primary Care Physician: Madalyn Rob, MD Referring Physician: Dr. August Albino is a 66 y.o. female with a h/o HLD, HTN, DMT2 and admission 02/03/22 with blurry vision/HA/poor balance x one week. CT suggested acute right PCA stroke. MRI confirmed. She was outside the window for tPA. Symptoms improved over the next 24 hours. PT/OT eval recommended outpatient therapy. She was discharged on DAPT with asa 325mg  and Plavix for 3 months.  Personally reviewed hospitalization pertinent progress notes, lab work and imaging.  Evaluated by Dr Erlinda Hong. a cardiac monitor was placed and recently showed 1 % afib and Dr. Quentin Ore requested appointment today to change anticoagulation for stroke prevention with prior stroke and now afib.   She was on asa and clopidogrel for awhile but is now on 325 mg asa only. No bleeding history. She c/o of fatigue. Denies snoring but lives alone.  Her PCP had discussed sleep study but study has not been scheduled. BP elevated here but BP well managed in the hospital and is better controlled at home usually around 140/70's.   Today, she denies symptoms of palpitations, chest pain, shortness of breath, orthopnea, PND, lower extremity edema, dizziness, presyncope, syncope, or neurologic sequela. The patient is tolerating medications without difficulties and is otherwise without complaint today.   Past Medical History:  Diagnosis Date   Cough 06/24/2021   Hypercholesteremia    Hypertension    Influenza A 11/21/2021   Stroke [facility])    Past Surgical History:  Procedure Laterality Date   ABDOMINAL HYSTERECTOMY     APPENDECTOMY      Current Outpatient Medications  Medication Sig Dispense Refill   albuterol (VENTOLIN HFA) 108 (90 Base) MCG/ACT inhaler Inhale 2 puffs into the lungs every 4 (four) hours as needed for shortness of breath.     amLODipine (NORVASC) 10 MG tablet Take 1 tablet (10 mg total) by mouth daily. 30 tablet 11   apixaban (ELIQUIS) 5 MG TABS tablet Take 1  tablet (5 mg total) by mouth 2 (two) times daily. 60 tablet 11   atorvastatin (LIPITOR) 80 MG tablet Take 1 tablet (80 mg total) by mouth daily. 100 tablet 2   empagliflozin (JARDIANCE) 10 MG TABS tablet Take 1 tablet (10 mg total) by mouth daily before breakfast. 90 tablet 1   ezetimibe (ZETIA) 10 MG tablet Take 1 tablet (10 mg total) by mouth daily. 90 tablet 1   ibuprofen (ADVIL) 200 MG tablet Take 200 mg by mouth every 6 (six) hours as needed for mild pain.     losartan-hydrochlorothiazide (HYZAAR) 100-25 MG tablet Take 1 tablet by mouth daily. 100 tablet 2   metFORMIN (GLUCOPHAGE) 1000 MG tablet Take 1 tablet (1,000 mg total) by mouth 2 (two) times daily with a meal. 200 tablet 2   No current facility-administered medications for this encounter.    Allergies  Allergen Reactions   Lisinopril Swelling    Social History   Socioeconomic History   Marital status: Single    Spouse name: Not on file   Number of children: 3   Years of education: Not on file   Highest education level: Some college, no degree  Occupational History   Occupation: Retired  Tobacco Use   Smoking status: Never    Passive exposure: Never   Smokeless tobacco: Never  Substance and Sexual Activity   Alcohol use: No   Drug use: Never   Sexual activity: Not on file  Other Topics Concern   Not on file  Social History Narrative   Lives w daughter   Left handed   Caffeine: rarely, mainly juice   Social Determinants of Health   Financial Resource Strain: Not on file  Food Insecurity: Not on file  Transportation Needs: Not on file  Physical Activity: Not on file  Stress: Not on file  Social Connections: Not on file  Intimate Partner Violence: Not on file    Family History  Problem Relation Age of Onset   Diabetes Mother    Diabetes Sister     ROS- All systems are reviewed and negative except as per the HPI above  Physical Exam: Vitals:   03/21/22 0848  BP: (!) 176/86  Pulse: 68  Weight: 78  kg  Height: 5\' 3"  (1.6 m)   Wt Readings from Last 3 Encounters:  03/21/22 78 kg  03/09/22 77.3 kg  03/03/22 77.1 kg    Labs: Lab Results  Component Value Date   NA 139 02/04/2022   K 3.6 02/04/2022   CL 105 02/04/2022   CO2 24 02/04/2022   GLUCOSE 161 (H) 02/04/2022   BUN 9 02/04/2022   CREATININE 0.84 02/04/2022   CALCIUM 8.8 (L) 02/04/2022   Lab Results  Component Value Date   INR 1.0 02/03/2022   Lab Results  Component Value Date   CHOL 226 (H) 02/04/2022   HDL 33 (L) 02/04/2022   LDLCALC 146 (H) 02/04/2022   TRIG 236 (H) 02/04/2022     GEN- The patient is well appearing, alert and oriented x 3 today.   Head- normocephalic, atraumatic Eyes-  Sclera clear, conjunctiva pink Ears- hearing intact Oropharynx- clear Neck- supple, no JVP Lymph- no cervical lymphadenopathy Lungs- Clear to ausculation bilaterally, normal work of breathing Heart- Regular rate and rhythm, no murmurs, rubs or gallops, PMI not laterally displaced GI- soft, NT, ND, + BS Extremities- no clubbing, cyanosis, or edema MS- no significant deformity or atrophy Skin- no rash or lesion Psych- euthymic mood, full affect Neuro- strength and sensation are intact  EKG-NSR at 68 bpm   Epic records reviewed   Assessment and Plan:  1. Afib  Recently seen on cardiac monitoring that was placed s/p stroke in April  I will stop asa ( neurology to be notified) and start eliquis 5 mg bid after full discussion of risk vrs benefit of drug  30 day free card given  I have asked her to go ahead with sleep study   No bleeding risk  Bleeding precautions discussed  CHA2DS2VASc  score of 6 Afib very low burden at present, pt not aware   2. HTN Elevated here  Asked to follow at home   I will see back in one month with CBC/bmet    F/u with Dr. Quentin Ore in July as scheduled   Geroge Baseman. Colleena Kurtenbach, [facility] [address] [place], [place] [address] [phone]

## 2022-04-12 ENCOUNTER — Ambulatory Visit (HOSPITAL_COMMUNITY)
Admission: RE | Admit: 2022-04-12 | Discharge: 2022-04-12 | Disposition: A | Discharge status: Home / Self Care | Payer: Medicare Other | Source: Ambulatory Visit | Attending: Nurse Practitioner | Admitting: Nurse Practitioner

## 2022-04-12 ENCOUNTER — Encounter (HOSPITAL_COMMUNITY): Payer: Self-pay | Admitting: Nurse Practitioner

## 2022-04-12 VITALS — BP 156/84 | HR 65 | Ht 63.0 in | Wt 172.6 lb

## 2022-04-12 DIAGNOSIS — I4891 Unspecified atrial fibrillation: Secondary | ICD-10-CM | POA: Insufficient documentation

## 2022-04-12 DIAGNOSIS — Z8673 Personal history of transient ischemic attack (TIA), and cerebral infarction without residual deficits: Secondary | ICD-10-CM | POA: Insufficient documentation

## 2022-04-12 DIAGNOSIS — D6869 Other thrombophilia: Secondary | ICD-10-CM | POA: Diagnosis not present

## 2022-04-12 DIAGNOSIS — E119 Type 2 diabetes mellitus without complications: Secondary | ICD-10-CM | POA: Insufficient documentation

## 2022-04-12 DIAGNOSIS — E785 Hyperlipidemia, unspecified: Secondary | ICD-10-CM | POA: Diagnosis not present

## 2022-04-12 DIAGNOSIS — I10 Essential (primary) hypertension: Secondary | ICD-10-CM | POA: Diagnosis not present

## 2022-04-12 LAB — BASIC METABOLIC PANEL
Anion gap: 9 (ref 5–15)
BUN: 26 mg/dL — ABNORMAL HIGH (ref 8–23)
CO2: 27 mmol/L (ref 22–32)
Calcium: 10.2 mg/dL (ref 8.9–10.3)
Chloride: 104 mmol/L (ref 98–111)
Creatinine, Ser: 1.16 mg/dL — ABNORMAL HIGH (ref 0.44–1.00)
GFR, Estimated: 52 mL/min — ABNORMAL LOW (ref >60)
Glucose, Bld: 206 mg/dL — ABNORMAL HIGH (ref 70–99)
Potassium: 3.9 mmol/L (ref 3.5–5.1)
Sodium: 140 mmol/L (ref 135–145)

## 2022-04-12 LAB — CBC
HCT: 34.3 % — ABNORMAL LOW (ref 36.0–46.0)
Hemoglobin: 11.3 g/dL — ABNORMAL LOW (ref 12.0–15.0)
MCH: 29.7 pg (ref 26.0–34.0)
MCHC: 32.9 g/dL (ref 30.0–36.0)
MCV: 90 fL (ref 80.0–100.0)
Platelets: 399 10*3/uL (ref 150–400)
RBC: 3.81 MIL/uL — ABNORMAL LOW (ref 3.87–5.11)
RDW: 15.6 % — ABNORMAL HIGH (ref 11.5–15.5)
WBC: 10.5 10*3/uL (ref 4.0–10.5)
nRBC: 0 % (ref 0.0–0.2)

## 2022-04-12 NOTE — Progress Notes (Signed)
Primary Care Physician: Crissie Sickles, MD Referring Physician: Dr. Boneta Lucks is a 66 y.o. female with a h/o HLD, HTN, DMT2 and admission 02/03/22 with blurry vision/HA/poor balance x one week. CT suggested acute right PCA stroke. MRI confirmed. She was outside the window for tPA. Symptoms improved over the next 24 hours. PT/OT eval recommended outpatient therapy. She was discharged on DAPT with asa 325mg  and Plavix for 3 months.  Personally reviewed hospitalization pertinent progress notes, lab work and imaging.  Evaluated by Dr . a cardiac monitor was placed and recently showed 1 % afib and Dr. Roda Shutters requested appointment today to change anticoagulation for stroke prevention with prior stroke and now afib.   She was on asa and clopidogrel for awhile but is now on 325 mg asa only. No bleeding history. She c/o of fatigue. Denies snoring but lives alone.  Her PCP had discussed sleep study but study has not been scheduled. BP elevated here but BP well managed in the hospital and is better controlled at home usually around 140/70's.   F/u in afib clinic, 04/12/22, now off asa and on eliquis for afib seen on Linq, placed  s/p CVA. She is doing well on eliquis, has not noted any difference  with the change in med. No further alerts re afib from the device clinic.   Today, she denies symptoms of palpitations, chest pain, shortness of breath, orthopnea, PND, lower extremity edema, dizziness, presyncope, syncope, or neurologic sequela. The patient is tolerating medications without difficulties and is otherwise without complaint today.   Past Medical History:  Diagnosis Date   Cough 06/24/2021   Hypercholesteremia    Hypertension    Influenza A 11/21/2021   Stroke [facility])    Past Surgical History:  Procedure Laterality Date   ABDOMINAL HYSTERECTOMY     APPENDECTOMY      Current Outpatient Medications  Medication Sig Dispense Refill   albuterol (VENTOLIN HFA) 108 (90 Base) MCG/ACT  inhaler Inhale 2 puffs into the lungs every 4 (four) hours as needed for shortness of breath.     amLODipine (NORVASC) 10 MG tablet Take 1 tablet (10 mg total) by mouth daily. 30 tablet 11   apixaban (ELIQUIS) 5 MG TABS tablet Take 1 tablet (5 mg total) by mouth 2 (two) times daily. 60 tablet 11   atorvastatin (LIPITOR) 80 MG tablet Take 1 tablet (80 mg total) by mouth daily. 100 tablet 2   empagliflozin (JARDIANCE) 10 MG TABS tablet Take 1 tablet (10 mg total) by mouth daily before breakfast. 90 tablet 1   ezetimibe (ZETIA) 10 MG tablet Take 1 tablet (10 mg total) by mouth daily. 90 tablet 1   ibuprofen (ADVIL) 200 MG tablet Take 200 mg by mouth every 6 (six) hours as needed for mild pain.     losartan-hydrochlorothiazide (HYZAAR) 100-25 MG tablet Take 1 tablet by mouth daily. 100 tablet 2   metFORMIN (GLUCOPHAGE) 1000 MG tablet Take 1 tablet (1,000 mg total) by mouth 2 (two) times daily with a meal. 200 tablet 2   No current facility-administered medications for this encounter.    Allergies  Allergen Reactions   Lisinopril Swelling    Social History   Socioeconomic History   Marital status: Single    Spouse name: Not on file   Number of children: 3   Years of education: Not on file   Highest education level: Some college, no degree  Occupational History   Occupation: Retired  Tobacco Use  Smoking status: Never    Passive exposure: Never   Smokeless tobacco: Never  Substance and Sexual Activity   Alcohol use: No   Drug use: Never   Sexual activity: Not on file  Other Topics Concern   Not on file  Social History Narrative   Lives w daughter   Left handed   Caffeine: rarely, mainly juice   Social Determinants of Health   Financial Resource Strain: Not on file  Food Insecurity: Not on file  Transportation Needs: Not on file  Physical Activity: Not on file  Stress: Not on file  Social Connections: Not on file  Intimate Partner Violence: Not on file    Family  History  Problem Relation Age of Onset   Diabetes Mother    Diabetes Sister     ROS- All systems are reviewed and negative except as per the HPI above  Physical Exam: Vitals:   04/12/22 0857  Weight: 78.3 kg  Height: 5\' 3"  (1.6 m)   Wt Readings from Last 3 Encounters:  04/12/22 78.3 kg  03/21/22 78 kg  03/09/22 77.3 kg    Labs: Lab Results  Component Value Date   NA 139 02/04/2022   K 3.6 02/04/2022   CL 105 02/04/2022   CO2 24 02/04/2022   GLUCOSE 161 (H) 02/04/2022   BUN 9 02/04/2022   CREATININE 0.84 02/04/2022   CALCIUM 8.8 (L) 02/04/2022   Lab Results  Component Value Date   INR 1.0 02/03/2022   Lab Results  Component Value Date   CHOL 226 (H) 02/04/2022   HDL 33 (L) 02/04/2022   LDLCALC 146 (H) 02/04/2022   TRIG 236 (H) 02/04/2022     GEN- The patient is well appearing, alert and oriented x 3 today.   Head- normocephalic, atraumatic Eyes-  Sclera clear, conjunctiva pink Ears- hearing intact Oropharynx- clear Neck- supple, no JVP Lymph- no cervical lymphadenopathy Lungs- Clear to ausculation bilaterally, normal work of breathing Heart- Regular rate and rhythm, no murmurs, rubs or gallops, PMI not laterally displaced GI- soft, NT, ND, + BS Extremities- no clubbing, cyanosis, or edema MS- no significant deformity or atrophy Skin- no rash or lesion Psych- euthymic mood, full affect Neuro- strength and sensation are intact  EKG-  Vent. rate 65 BPM PR interval 196 ms QRS duration 76 ms QT/QTcB 402/418 ms P-R-T axes 60 -26 205 Normal sinus rhythm Moderate voltage criteria for LVH, may be normal variant ( R in aVL , Cornell product ) ST & T wave abnormality, consider inferolateral ischemia Abnormal ECG When compared with ECG of 21-Mar-2022 09:01, PREVIOUS ECG IS PRESENT  Epic records reviewed   Assessment and Plan:  1. Afib  Recently seen on cardiac monitoring that was placed s/p stroke in April  Asa stopped, and started eliquis 5 mg bid  after full discussion of risk vrs benefit of drug on last visit here. Bleeding precautions discussed  CHA2DS2VASc  score of 6 Afib very low burden at present  Cbc/bmet  2. HTN Has been better at home around 140 systolic  Asked to follow at home   F/u with Dr. May in July as scheduled  Afib clinic as needed   August. [facility] [address] [place], [place] [phone]

## 2022-04-13 ENCOUNTER — Encounter (HOSPITAL_COMMUNITY): Payer: Self-pay | Admitting: *Deleted

## 2022-04-24 ENCOUNTER — Encounter: Payer: Self-pay | Admitting: Cardiology

## 2022-04-24 ENCOUNTER — Ambulatory Visit: Payer: Medicare Other | Admitting: Cardiology

## 2022-04-24 ENCOUNTER — Other Ambulatory Visit (HOSPITAL_COMMUNITY): Payer: Self-pay

## 2022-04-24 VITALS — BP 140/84 | HR 71 | Ht 63.0 in | Wt 169.4 lb

## 2022-04-24 DIAGNOSIS — Z8673 Personal history of transient ischemic attack (TIA), and cerebral infarction without residual deficits: Secondary | ICD-10-CM | POA: Diagnosis not present

## 2022-04-24 DIAGNOSIS — I480 Paroxysmal atrial fibrillation: Secondary | ICD-10-CM

## 2022-04-24 MED ORDER — APIXABAN 5 MG PO TABS
5.0000 mg | ORAL_TABLET | Freq: Two times a day (BID) | ORAL | 11 refills | Status: DC
  Filled 2022-04-24: qty 60, 30d supply, fill #0

## 2022-04-24 NOTE — Patient Instructions (Signed)

## 2022-04-24 NOTE — Progress Notes (Signed)
Electrophysiology Office Note:    Date:  04/24/2022   ID:  Cheyenne Valenzuela, DOB 03/27/1956, MRN [id number]  PCP:  Crissie Sickles, MD  [facility] Cardiologist:  None  [facility] Electrophysiologist:  Lanier Prude, MD   Referring MD: Albertha Ghee, MD   Chief Complaint: New diagnosis atrial fibrillation  History of Present Illness:    Cheyenne Valenzuela is a 66 y.o. female who presents for an evaluation of new diagnosis atrial fibrillation at the request of Rudi Coco, NP. Their medical history includes stroke, hypertension and hyperlipidemia.  She was admitted February 03, 2022 with a stroke of unknown origin.  A heart monitor was ordered after leaving the hospital which revealed a less than 1% burden of atrial fibrillation.  She was referred to the atrial fibrillation clinic and Eliquis was started for secondary prevention.  She has done well on the Eliquis without any bleeding issues.  She is working with her primary care physician on blood pressure control and her blood pressures typically around 140 mmHg systolic.     Past Medical History:  Diagnosis Date   Cough 06/24/2021   Hypercholesteremia    Hypertension    Influenza A 11/21/2021   Stroke [facility])     Past Surgical History:  Procedure Laterality Date   ABDOMINAL HYSTERECTOMY     APPENDECTOMY      Current Medications: Current Meds  Medication Sig   albuterol (VENTOLIN HFA) 108 (90 Base) MCG/ACT inhaler Inhale 2 puffs into the lungs every 4 (four) hours as needed for shortness of breath.   amLODipine (NORVASC) 10 MG tablet Take 1 tablet (10 mg total) by mouth daily.   atorvastatin (LIPITOR) 80 MG tablet Take 1 tablet (80 mg total) by mouth daily.   empagliflozin (JARDIANCE) 10 MG TABS tablet Take 1 tablet (10 mg total) by mouth daily before breakfast.   ezetimibe (ZETIA) 10 MG tablet Take 1 tablet (10 mg total) by mouth daily.   losartan-hydrochlorothiazide (HYZAAR) 100-25 MG tablet Take 1 tablet by mouth daily.    metFORMIN (GLUCOPHAGE) 1000 MG tablet Take 1 tablet (1,000 mg total) by mouth 2 (two) times daily with a meal.   [DISCONTINUED] apixaban (ELIQUIS) 5 MG TABS tablet Take 1 tablet (5 mg total) by mouth 2 (two) times daily.     Allergies:   Lisinopril   Social History   Socioeconomic History   Marital status: Single    Spouse name: Not on file   Number of children: 3   Years of education: Not on file   Highest education level: Some college, no degree  Occupational History   Occupation: Retired  Tobacco Use   Smoking status: Never    Passive exposure: Never   Smokeless tobacco: Never  Substance and Sexual Activity   Alcohol use: No   Drug use: Never   Sexual activity: Not on file  Other Topics Concern   Not on file  Social History Narrative   Lives w daughter   Left handed   Caffeine: rarely, mainly juice   Social Determinants of Health   Financial Resource Strain: Not on file  Food Insecurity: Not on file  Transportation Needs: Not on file  Physical Activity: Not on file  Stress: Not on file  Social Connections: Not on file     Family History: The patient's family history includes Diabetes in her mother and sister.  ROS:   Please see the history of present illness.    All other systems reviewed and are negative.  EKGs/Labs/Other Studies Reviewed:    The following studies were reviewed today:  03/16/2022 [employer] <1% AF      Recent Labs: 02/03/2022: ALT 18 02/04/2022: TSH 1.276 04/12/2022: BUN 26; Creatinine, Ser 1.16; Hemoglobin 11.3; Platelets 399; Potassium 3.9; Sodium 140  Recent Lipid Panel    Component Value Date/Time   CHOL 226 (H) 02/04/2022 0334   CHOL 217 (H) 01/10/2022 1525   TRIG 236 (H) 02/04/2022 0334   HDL 33 (L) 02/04/2022 0334   HDL 32 (L) 01/10/2022 1525   CHOLHDL 6.8 02/04/2022 0334   VLDL 47 (H) 02/04/2022 0334   LDLCALC 146 (H) 02/04/2022 0334   LDLCALC 149 (H) 01/10/2022 1525    Physical Exam:    VS:  BP 140/84   Pulse 71    Ht 5\' 3"  (1.6 m)   Wt 169 lb 6.4 oz (76.8 kg)   SpO2 99%   BMI 30.01 kg/m     Wt Readings from Last 3 Encounters:  04/24/22 169 lb 6.4 oz (76.8 kg)  04/12/22 172 lb 9.6 oz (78.3 kg)  03/21/22 172 lb (78 kg)     GEN:  Well nourished, well developed in no acute distress HEENT: Normal NECK: No JVD; No carotid bruits LYMPHATICS: No lymphadenopathy CARDIAC: RRR, no murmurs, rubs, gallops RESPIRATORY:  Clear to auscultation without rales, wheezing or rhonchi  ABDOMEN: Soft, non-tender, non-distended MUSCULOSKELETAL:  No edema; No deformity  SKIN: Warm and dry NEUROLOGIC:  Alert and oriented x 3 PSYCHIATRIC:  Normal affect       ASSESSMENT:    1. Hx of ischemic right PCA stroke   2. Paroxysmal atrial fibrillation (HCC)    PLAN:    In order of problems listed above:   #Paroxysmal atrial fibrillation Very low burden.  On Eliquis for secondary stroke prophylaxis given her history of ischemic stroke/TIA.  Tolerating blood thinner well without any bleeding issues.  #Hypertension Fairly well controlled although her systolics are around 140 at home.  She will need to keep a very close eye on this and continue to follow-up closely with her primary care physician.  Follow-up 1 year or sooner as needed.   Total time of encounter: 45 minutes total time of encounter, including face-to-face patient care, coordination of care and counseling regarding high complexity medical decision making re: new diagnosis AF.   Medication Adjustments/Labs and Tests Ordered: Current medicines are reviewed at length with the patient today.  Concerns regarding medicines are outlined above.  No orders of the defined types were placed in this encounter.  Meds ordered this encounter  Medications   apixaban (ELIQUIS) 5 MG TABS tablet    Sig: Take 1 tablet (5 mg total) by mouth 2 (two) times daily.    Dispense:  60 tablet    Refill:  11     Signed, Tikita Mabee T. 03/23/22, MD, [facility], [facility] 04/24/2022  11:45 AM    Electrophysiology [facility]

## 2022-04-25 ENCOUNTER — Ambulatory Visit: Payer: Medicare Other | Admitting: Cardiology

## 2022-05-12 ENCOUNTER — Ambulatory Visit (INDEPENDENT_AMBULATORY_CARE_PROVIDER_SITE_OTHER): Payer: Medicare Other | Admitting: *Deleted

## 2022-05-12 ENCOUNTER — Other Ambulatory Visit (HOSPITAL_COMMUNITY): Payer: Self-pay

## 2022-05-12 ENCOUNTER — Ambulatory Visit (INDEPENDENT_AMBULATORY_CARE_PROVIDER_SITE_OTHER): Payer: Medicare Other | Admitting: Student

## 2022-05-12 ENCOUNTER — Encounter: Payer: Self-pay | Admitting: Student

## 2022-05-12 VITALS — BP 136/73 | HR 80 | Temp 97.9°F | Ht 63.0 in | Wt 170.9 lb

## 2022-05-12 DIAGNOSIS — Z0000 Encounter for general adult medical examination without abnormal findings: Secondary | ICD-10-CM | POA: Diagnosis not present

## 2022-05-12 DIAGNOSIS — E785 Hyperlipidemia, unspecified: Secondary | ICD-10-CM | POA: Diagnosis not present

## 2022-05-12 DIAGNOSIS — I4891 Unspecified atrial fibrillation: Secondary | ICD-10-CM | POA: Diagnosis not present

## 2022-05-12 DIAGNOSIS — E119 Type 2 diabetes mellitus without complications: Secondary | ICD-10-CM

## 2022-05-12 DIAGNOSIS — Z8673 Personal history of transient ischemic attack (TIA), and cerebral infarction without residual deficits: Secondary | ICD-10-CM

## 2022-05-12 DIAGNOSIS — E1169 Type 2 diabetes mellitus with other specified complication: Secondary | ICD-10-CM

## 2022-05-12 DIAGNOSIS — I10 Essential (primary) hypertension: Secondary | ICD-10-CM | POA: Diagnosis not present

## 2022-05-12 DIAGNOSIS — Z7984 Long term (current) use of oral hypoglycemic drugs: Secondary | ICD-10-CM

## 2022-05-12 LAB — POCT GLYCOSYLATED HEMOGLOBIN (HGB A1C): Hemoglobin A1C: 9 % — AB (ref 4.0–5.6)

## 2022-05-12 LAB — GLUCOSE, CAPILLARY: Glucose-Capillary: 273 mg/dL — ABNORMAL HIGH (ref 70–99)

## 2022-05-12 MED ORDER — EMPAGLIFLOZIN 10 MG PO TABS
10.0000 mg | ORAL_TABLET | Freq: Every day | ORAL | 1 refills | Status: DC
  Filled 2022-05-12: qty 90, 90d supply, fill #0

## 2022-05-12 MED ORDER — AMLODIPINE BESYLATE 10 MG PO TABS
10.0000 mg | ORAL_TABLET | Freq: Every day | ORAL | 11 refills | Status: DC
  Filled 2022-05-12: qty 30, 30d supply, fill #0

## 2022-05-12 MED ORDER — EZETIMIBE 10 MG PO TABS
10.0000 mg | ORAL_TABLET | Freq: Every day | ORAL | 1 refills | Status: DC
  Filled 2022-05-12: qty 90, 90d supply, fill #0

## 2022-05-12 MED ORDER — METFORMIN HCL 1000 MG PO TABS
1000.0000 mg | ORAL_TABLET | Freq: Two times a day (BID) | ORAL | 2 refills | Status: DC
  Filled 2022-05-12: qty 200, 100d supply, fill #0

## 2022-05-12 MED ORDER — ATORVASTATIN CALCIUM 80 MG PO TABS
80.0000 mg | ORAL_TABLET | Freq: Every day | ORAL | 2 refills | Status: DC
  Filled 2022-05-12: qty 100, 100d supply, fill #0

## 2022-05-12 MED ORDER — LOSARTAN POTASSIUM-HCTZ 100-25 MG PO TABS
1.0000 | ORAL_TABLET | Freq: Every day | ORAL | 2 refills | Status: DC
  Filled 2022-05-12: qty 100, 100d supply, fill #0

## 2022-05-12 NOTE — Patient Instructions (Addendum)
  Thank you, Ms.Frederik Schmidt for allowing [place] to provide your care today. Today we discussed . . .  - importance of taking your medications for hypertension, diabetes, high cholesterol, and Afib - diet and lifestyle changes can help you long term--right now medications are just as if not more important - continue to monitor your blood pressure and glucose while paying attention to what causes them to change   I have ordered the following labs for you:  Lab Orders         Lipid Profile         BMP8+Anion Gap         Hepatitis C Ab reflex to Quant PCR         POC Hbg A1C       Tests ordered today:  None   Referrals ordered today:   Referral Orders  No referral(s) requested today      I have ordered the following medication/changed the following medications:   Stop the following medications: Medications Discontinued During This Encounter  Medication Reason   ibuprofen (ADVIL) 200 MG tablet Patient Preference   albuterol (VENTOLIN HFA) 108 (90 Base) MCG/ACT inhaler      Start the following medications: No orders of the defined types were placed in this encounter.     Follow up: 3 months    Remember: Please take your medications regularly. Also remember the dietary and exercise modifications that we discussed during today's visit. We will see you in three months!   Should you have any questions or concerns please call the internal medicine clinic at [phone].     Lajuana Ripple, MD [facility]

## 2022-05-12 NOTE — Progress Notes (Signed)
Subjective:   Cheyenne SchmidtRose Valenzuela is a 66 y.o. female who presents for an Initial Medicare Annual Wellness Visit.  Review of Systems    Defer to pcp        Objective:    Today's Vitals   05/12/22 0905  BP: 136/73  Pulse: 80  Temp: 97.9 F (36.6 C)  TempSrc: Oral  SpO2: 100%  Weight: 170 lb 13.7 oz (77.5 kg)  Height: 5\' 3"  (1.6 m)   Body mass index is 30.27 kg/m.     02/14/2022    9:39 AM 02/09/2022    8:59 AM 02/03/2022    8:24 PM 02/03/2022    3:12 PM 02/03/2022   10:37 AM 01/10/2022    2:13 PM 12/12/2021    2:22 PM  Advanced Directives  Does Patient Have a Medical Advance Directive? No No No No No No No  Would patient like information on creating a medical advance directive? No - Patient declined Yes (MAU/Ambulatory/Procedural Areas - Information given) Yes (ED - Information included in AVS)  No - Patient declined No - Patient declined No - Patient declined    Current Medications (verified) Outpatient Encounter Medications as of 05/12/2022  Medication Sig   amLODipine (NORVASC) 10 MG tablet Take 1 tablet (10 mg total) by mouth daily.   apixaban (ELIQUIS) 5 MG TABS tablet Take 1 tablet (5 mg total) by mouth 2 (two) times daily.   atorvastatin (LIPITOR) 80 MG tablet Take 1 tablet (80 mg total) by mouth daily.   empagliflozin (JARDIANCE) 10 MG TABS tablet Take 1 tablet (10 mg total) by mouth daily before breakfast.   ezetimibe (ZETIA) 10 MG tablet Take 1 tablet (10 mg total) by mouth daily.   losartan-hydrochlorothiazide (HYZAAR) 100-25 MG tablet Take 1 tablet by mouth daily.   metFORMIN (GLUCOPHAGE) 1000 MG tablet Take 1 tablet (1,000 mg total) by mouth 2 (two) times daily with a meal.   [DISCONTINUED] albuterol (VENTOLIN HFA) 108 (90 Base) MCG/ACT inhaler Inhale 2 puffs into the lungs every 4 (four) hours as needed for shortness of breath.   [DISCONTINUED] ibuprofen (ADVIL) 200 MG tablet Take 200 mg by mouth every 6 (six) hours as needed for mild pain. (Patient not taking: Reported  on 04/24/2022)   No facility-administered encounter medications on file as of 05/12/2022.    Allergies (verified) Lisinopril   History: Past Medical History:  Diagnosis Date   Cough 06/24/2021   Hypercholesteremia    Hypertension    Influenza A 11/21/2021   Stroke [facility](HCC)    Past Surgical History:  Procedure Laterality Date   ABDOMINAL HYSTERECTOMY     APPENDECTOMY     Family History  Problem Relation Age of Onset   Diabetes Mother    Diabetes Sister    Social History   Socioeconomic History   Marital status: Single    Spouse name: Not on file   Number of children: 3   Years of education: Not on file   Highest education level: Some college, no degree  Occupational History   Occupation: Retired  Tobacco Use   Smoking status: Never    Passive exposure: Never   Smokeless tobacco: Never  Substance and Sexual Activity   Alcohol use: No   Drug use: Never   Sexual activity: Not on file  Other Topics Concern   Not on file  Social History Narrative   Lives w daughter   Left handed   Caffeine: rarely, mainly juice   Social Determinants of Health  Financial Resource Strain: Low Risk  (05/12/2022)   Overall Financial Resource Strain (CARDIA)    Difficulty of Paying Living Expenses: Not very hard  Food Insecurity: No Food Insecurity (05/12/2022)   Hunger Vital Sign    Worried About Running Out of Food in the Last Year: Never true    Ran Out of Food in the Last Year: Never true  Transportation Needs: No Transportation Needs (05/12/2022)   PRAPARE - Administrator, Civil Service (Medical): No    Lack of Transportation (Non-Medical): No  Physical Activity: Inactive (05/12/2022)   Exercise Vital Sign    Days of Exercise per Week: 0 days    Minutes of Exercise per Session: 0 min  Stress: No Stress Concern Present (05/12/2022)   [employer] of Occupational Health - Occupational Stress Questionnaire    Feeling of Stress : Not at all  Social Connections:  Moderately Integrated (05/12/2022)   Social Connection and Isolation Panel [NHANES]    Frequency of Communication with Friends and Family: Three times a week    Frequency of Social Gatherings with Friends and Family: Once a week    Attends Religious Services: More than 4 times per year    Active Member of [employer] or Organizations: Yes    Attends Banker Meetings: Never    Marital Status: Widowed    Tobacco Counseling Counseling given: Not Answered   Clinical Intake:  Pre-visit preparation completed: Yes  Pain : No/denies pain     Diabetes: Yes CBG done?: Yes CBG resulted in Enter/ Edit results?: Yes Did pt. bring in CBG monitor from home?: No  How often do you need to have someone help you when you read instructions, pamphlets, or other written materials from your doctor or pharmacy?: 1 - Never What is the last grade level you completed in school?: 35yrs (one yr of college)  Diabetic? Nutrition Risk Assessment:  Has the patient had any N/V/D within the last 2 months?  No  Does the patient have any non-healing wounds?  No  Has the patient had any unintentional weight loss or weight gain?  No   Diabetes:  Is the patient diabetic?  Yes  If diabetic, was a CBG obtained today?  Yes  Did the patient bring in their glucometer from home?  No  How often do you monitor your CBG's? Do not monitor .   Financial Strains and Diabetes Management:  Are you having any financial strains with the device, your supplies or your medication?  N/a .  Does the patient want to be seen by Chronic Care Management for management of their diabetes?  No  Would the patient like to be referred to a Nutritionist or for Diabetic Management?  No   Diabetic Exams:  Diabetic Eye Exam: Overdue for diabetic eye exam. Pt has been advised about the importance in completing this exam. Patient advised to call and schedule an eye exam. Diabetic Foot Exam: Completed 05/12/2022    Interpreter  Needed?: No  Information entered by :: [email or web address]   Activities of Daily Living    05/12/2022   10:51 AM 05/12/2022   10:07 AM  In your present state of health, do you have any difficulty performing the following activities:  Hearing? 0 0  Vision? 0 0  Difficulty concentrating or making decisions? 0 0  Walking or climbing stairs? 0 0  Dressing or bathing? 0 0  Doing errands, shopping? 0 0  Preparing Food and eating ?  N  Using the Toilet?  N  In the past six months, have you accidently leaked urine?  N  Do you have problems with loss of bowel control?  N  Managing your Medications?  N  Managing your Finances?  N  Housekeeping or managing your Housekeeping?  N    Patient Care Team: Crissie Sickles, MD as PCP - General Lanier Prude, MD as PCP - Electrophysiology (Cardiology)  Indicate any recent Medical Services you may have received from other than [facility] providers in the past year (date may be approximate).     Assessment:   This is a routine wellness examination for Cheyenne Valenzuela.  Hearing/Vision screen No results found.  Dietary issues and exercise activities discussed: Current Exercise Habits: The patient does not participate in regular exercise at present   Goals Addressed             This Visit's Progress    Exercise 3x per week (30 min per time)        Depression Screen    05/12/2022   10:51 AM 05/12/2022    9:06 AM 02/09/2022    9:55 AM 02/03/2022   10:32 AM 12/12/2021    2:22 PM 11/21/2021    1:40 PM 08/29/2021    1:19 PM  PHQ 2/9 Scores  PHQ - 2 Score 0 0 3 0 0 0 0  PHQ- 9 Score 1  5   0 0    Fall Risk    05/12/2022   10:51 AM 02/09/2022   11:27 AM 02/03/2022   10:31 AM 01/10/2022    2:14 PM 12/12/2021    2:21 PM  Fall Risk   Falls in the past year? 1 1 0 0 0  Number falls in past yr: 0 0 0 0 0  Injury with Fall? 0  0 0 0  Risk for fall due to : No Fall Risks History of fall(s) No Fall Risks  No Fall Risks  Risk for fall due to: Comment  Slipped going up  stairs     Follow up Falls evaluation completed Falls evaluation completed Falls evaluation completed Falls evaluation completed Falls evaluation completed    FALL RISK PREVENTION PERTAINING TO THE HOME:  Any stairs in or around the home? Yes  If so, are there any without handrails? No  Home free of loose throw rugs in walkways, pet beds, electrical cords, etc? No  Adequate lighting in your home to reduce risk of falls? Yes   ASSISTIVE DEVICES UTILIZED TO PREVENT FALLS:  Life alert? No  Use of a cane, walker or w/c? No  Grab bars in the bathroom? Yes  Shower chair or bench in shower? No  Elevated toilet seat or a handicapped toilet? No   TIMED UP AND GO:  Was the test performed? No .  Length of time to ambulate 10 feet: 0 sec.   Gait steady and fast without use of assistive device  Cognitive Function:        05/12/2022   11:41 AM  6CIT Screen  What Year? 0 points  What month? 0 points  What time? 0 points  Count back from 20 0 points  Months in reverse 0 points  Repeat phrase 0 points  Total Score 0 points    Immunizations Immunization History  Administered Date(s) Administered   Influenza-Unspecified 06/25/2008, 07/13/2011, 07/05/2012, 07/09/2013, 07/15/2014, 07/06/2015   PNEUMOCOCCAL CONJUGATE-20 12/12/2021   PPD Test 07/04/2010, 06/27/2011, 08/10/2020   Tdap 03/25/2009, 09/20/2010    TDAP status:  Due, Education has been provided regarding the importance of this vaccine. Advised may receive this vaccine at local pharmacy or Health Dept. Aware to provide a copy of the vaccination record if obtained from local pharmacy or Health Dept. Verbalized acceptance and understanding.  Flu Vaccine status: Due, Education has been provided regarding the importance of this vaccine. Advised may receive this vaccine at local pharmacy or Health Dept. Aware to provide a copy of the vaccination record if obtained from local pharmacy or Health Dept. Verbalized acceptance and  understanding.  Pneumococcal vaccine status: Up to date  Covid-19 vaccine status: Completed vaccines  Qualifies for Shingles Vaccine? Yes   Zostavax completed No   Shingrix Completed?: No.    Education has been provided regarding the importance of this vaccine. Patient has been advised to call insurance company to determine out of pocket expense if they have not yet received this vaccine. Advised may also receive vaccine at local pharmacy or Health Dept. Verbalized acceptance and understanding.  Screening Tests Health Maintenance  Topic Date Due   COVID-19 Vaccine (1) Never done   OPHTHALMOLOGY EXAM  Never done   Hepatitis C Screening  Never done   Zoster Vaccines- Shingrix (1 of 2) Never done   TETANUS/TDAP  09/20/2020   DEXA SCAN  Never done   FOOT EXAM  02/24/2022   INFLUENZA VACCINE  05/09/2022   COLONOSCOPY (Pts 45-29yrs Insurance coverage will need to be confirmed)  12/13/2022 (Originally 04/13/2001)   HEMOGLOBIN A1C  11/12/2022   COLON CANCER SCREENING ANNUAL FOBT  01/10/2023   MAMMOGRAM  08/31/2023   Pneumonia Vaccine 43+ Years old  Completed   HPV VACCINES  Aged Out    Health Maintenance  Health Maintenance Due  Topic Date Due   COVID-19 Vaccine (1) Never done   OPHTHALMOLOGY EXAM  Never done   Hepatitis C Screening  Never done   Zoster Vaccines- Shingrix (1 of 2) Never done   TETANUS/TDAP  09/20/2020   DEXA SCAN  Never done   FOOT EXAM  02/24/2022   INFLUENZA VACCINE  05/09/2022    Mammogram status: Completed 08/30/2021. Repeat every year  Bone Density Status unknown  Lung Cancer Screening: (Low Dose CT Chest recommended if Age 33-80 years, 30 pack-year currently smoking OR have quit w/in 15years.) does not qualify.   Lung Cancer Screening Referral: n/a  Additional Screening:  Hepatitis C Screening: does qualify; Completed 05/12/2022  Vision Screening: Recommended annual ophthalmology exams for early detection of glaucoma and other disorders of the  eye. Is the patient up to date with their annual eye exam?  No  Who is the provider or what is the name of the office in which the patient attends annual eye exams? unknown If pt is not established with a provider, would they like to be referred to a provider to establish care? No .   Dental Screening: Recommended annual dental exams for proper oral hygiene  Community Resource Referral / Chronic Care Management: CRR required this visit?  No   CCM required this visit?  No      Plan:     I have personally reviewed and noted the following in the patient's chart:   Medical and social history Use of alcohol, tobacco or illicit drugs  Current medications and supplements including opioid prescriptions. Patient is not currently taking opioid prescriptions. Functional ability and status Nutritional status Physical activity Advanced directives List of other physicians Hospitalizations, surgeries, and ER visits in previous 12 months Vitals Screenings to  include cognitive, depression, and falls Referrals and appointments  In addition, I have reviewed and discussed with patient certain preventive protocols, quality metrics, and best practice recommendations. A written personalized care plan for preventive services as well as general preventive health recommendations were provided to patient.     Joslyn Devon, [place]   05/12/2022   Nurse Notes: face to face 20 min  Cheyenne Valenzuela , Thank you for taking time to come for your Medicare Wellness Visit. I appreciate your ongoing commitment to your health goals. Please review the following plan we discussed and let me know if I can assist you in the future.   These are the goals we discussed:  Goals      Exercise 3x per week (30 min per time)        This is a list of the screening recommended for you and due dates:  Health Maintenance  Topic Date Due   COVID-19 Vaccine (1) Never done   Eye exam for diabetics  Never done    Hepatitis C Screening: USPSTF Recommendation to screen - Ages 34-79 yo.  Never done   Zoster (Shingles) Vaccine (1 of 2) Never done   Tetanus Vaccine  09/20/2020   DEXA scan (bone density measurement)  Never done   Complete foot exam   02/24/2022   Flu Shot  05/09/2022   Colon Cancer Screening  12/13/2022*   Hemoglobin A1C  11/12/2022   Stool Blood Test  01/10/2023   Mammogram  08/31/2023   Pneumonia Vaccine  Completed   HPV Vaccine  Aged Out  *Topic was postponed. The date shown is not the original due date.

## 2022-05-12 NOTE — Progress Notes (Signed)
CC: 46-month Follow-up  HPI:   [name] is a 66 y.o. female with a past medical history of hypertension, diabetes, stroke, and atrial fibrillation who presents for 36-month follow-up after being hospitalized for ischemic R PCA stroke.  Last seen in clinic on 4-28 by Dr. Barbaraann Valenzuela for blurry vision, which led to the discovery of her CVA and subsequent hospitalization.    Past Medical History:  Diagnosis Date   Cough 06/24/2021   Hypercholesteremia    Hypertension    Influenza A 11/21/2021   Stroke (HCC)      Review of Systems:    Reports occasional blurry vision usually in the mornings Denies recent illnesses, fever, chills, sweats, diarrhea, constipation, bleeding, discolored stool or urine, headache, dizziness, lightheadedness, numbness, tingling, blurry vision   Physical Exam:  Vitals:   05/12/22 0905  BP: 136/73  Pulse: 80  Temp: 97.9 F (36.6 C)  TempSrc: Oral  SpO2: 100%  Weight: 170 lb 14.4 oz (77.5 kg)  Height: 5\' 3"  (1.6 m)    General:   awake and alert, sitting comfortably in chair, cooperative, not in acute distress Skin:   warm and dry, intact without any obvious lesions or scars, no rashes or lesions  Eyes:   extraocular movements intact, visual fields intact bilaterally Ears:   pinnae normal, no discharge or external lesions  Lungs:   normal respiratory effort, breathing unlabored, symmetrical chest rise, no crackles or wheezing Cardiac:   regular rate and rhythm, normal S1 and S2, capillary refill 2-3 seconds, dorsalis pedis pulses intact 1+ bilaterally Psychiatric:   mood and affect normal, intelligible speech    Assessment & Plan:   Hypertension Patient takes her blood pressure at home.  These values have been 150s/70-80s and today in clinic it was 137/74.  She reports taking her medications daily, but has not been prescribed enough to take at this frequency. In addition to medication counseling, much of today's visit was spent discussing  lifestyle and dietary changes that could help her lower her blood pressure. These include low salt, Mediterranean diet, and regular exercise.  - Continue amlodipine 10mg  and losartan-HCTZ 100-25mg  daily and prescribe refills for both - Pill bottle organizer was provided to improve adherence - Order BMP to evaluate renal function   Type 2 diabetes mellitus with hyperlipidemia (HCC)  Patient has a long history of elevated A1c.  In April, her A1c was 7.8 and at today's visit it was 9.0.  Reports taking her metformin and empagliflozin daily, however these medications have not been refilled in a long time.  In addition to medication counseling much of today's visit was spent discussing lifestyle and dietary changes that could help her gain better control over her diabetes.  Include less sugar, less simple carbohydrates, and more fiber.  - Continue metformin 1000mg  and empagliflozin 10mg  daily and refill both - Pill bottle organizer was provided to improve adherence   Hx of ischemic right PCA stroke  Reports improvement in her symptoms since her hospitalization for PCA back in May.  However she continues to experience some mild vision blurriness in her left eye.  Usually occurs in the mornings.  She is still reluctant to drive because of this.  On exam all of her visual fields were intact.  In April her lipid panel came back abnormal with a total cholesterol of 226, LDLs and triglycerides were also elevated.  Given her hypertension, diabetes, hyperlipidemia, newly diagnosed A-fib, and recent stroke, full medication adherence is imperative. We also discussed  lifestyle and dietary changes that can help reduce her risk for future stroke.  Patient no longer on DAPT therapy and only takes apixaban. She is followed by neurologist.  - Continue and refill atorvastatin 80mg  - Order lipid panel   Atrial fibrillation [facility]) Patient was newly diagnosed with atrial fibrillation by cardiologist on July 17.  She  is taking apixaban and will see her cardiologist again in 1 year.   - Continue apixaban 5mg      See Encounters Tab for problem based charting.  Patient seen with Dr. July 19

## 2022-05-12 NOTE — Assessment & Plan Note (Signed)
Patient was newly diagnosed with atrial fibrillation by cardiologist on July 17.  She is taking apixaban and will see her cardiologist again in 1 year.   - Continue apixaban 5mg 

## 2022-05-12 NOTE — Assessment & Plan Note (Signed)
  Patient has a long history of elevated A1c.  In April, her A1c was 7.8 and at today's visit it was 9.0.  Reports taking her metformin and empagliflozin daily, however these medications have not been refilled in a long time.  In addition to medication counseling much of today's visit was spent discussing lifestyle and dietary changes that could help her gain better control over her diabetes.  Include less sugar, less simple carbohydrates, and more fiber.  - Continue metformin 1000mg  and empagliflozin 10mg  daily and refill both - Pill bottle organizer was provided to improve adherence

## 2022-05-12 NOTE — Assessment & Plan Note (Signed)
Patient takes her blood pressure at home.  These values have been 150s/70-80s and today in clinic it was 137/74.  She reports taking her medications daily, but has not been prescribed enough to take at this frequency. In addition to medication counseling, much of today's visit was spent discussing lifestyle and dietary changes that could help her lower her blood pressure. These include low salt, Mediterranean diet, and regular exercise.  - Continue amlodipine 10mg  and losartan-HCTZ 100-25mg  daily and prescribe refills for both - Pill bottle organizer was provided to improve adherence - Order BMP to evaluate renal function

## 2022-05-12 NOTE — Patient Instructions (Signed)

## 2022-05-12 NOTE — Assessment & Plan Note (Addendum)
  Reports improvement in her symptoms since her hospitalization for PCA back in May.  However she continues to experience some mild vision blurriness in her left eye.  Usually occurs in the mornings.  She is still reluctant to drive because of this.  On exam all of her visual fields were intact.  In April her lipid panel came back abnormal with a total cholesterol of 226, LDLs and triglycerides were also elevated.  Given her hypertension, diabetes, hyperlipidemia, newly diagnosed A-fib, and recent stroke, full medication adherence is imperative. We also discussed lifestyle and dietary changes that can help reduce her risk for future stroke.  Patient no longer on DAPT therapy and only takes apixaban. She is followed by neurologist.  - Continue and refill atorvastatin 80mg  - Order lipid panel

## 2022-05-13 LAB — BMP8+ANION GAP
Anion Gap: 18 mmol/L (ref 10.0–18.0)
BUN/Creatinine Ratio: 22 (ref 12–28)
BUN: 34 mg/dL — ABNORMAL HIGH (ref 8–27)
CO2: 25 mmol/L (ref 20–29)
Calcium: 10.6 mg/dL — ABNORMAL HIGH (ref 8.7–10.3)
Chloride: 92 mmol/L — ABNORMAL LOW (ref 96–106)
Creatinine, Ser: 1.54 mg/dL — ABNORMAL HIGH (ref 0.57–1.00)
Glucose: 264 mg/dL — ABNORMAL HIGH (ref 70–99)
Potassium: 3.1 mmol/L — ABNORMAL LOW (ref 3.5–5.2)
Sodium: 135 mmol/L (ref 134–144)
eGFR: 37 mL/min/{1.73_m2} — ABNORMAL LOW (ref >59)

## 2022-05-13 LAB — LIPID PANEL
Chol/HDL Ratio: 6.9 ratio — ABNORMAL HIGH (ref 0.0–4.4)
Cholesterol, Total: 243 mg/dL — ABNORMAL HIGH (ref 100–199)
HDL: 35 mg/dL — ABNORMAL LOW (ref >39)
LDL Chol Calc (NIH): 156 mg/dL — ABNORMAL HIGH (ref 0–99)
Triglycerides: 280 mg/dL — ABNORMAL HIGH (ref 0–149)
VLDL Cholesterol Cal: 52 mg/dL — ABNORMAL HIGH (ref 5–40)

## 2022-05-13 LAB — HCV AB W REFLEX TO QUANT PCR: HCV Ab: NONREACTIVE

## 2022-05-13 LAB — HCV INTERPRETATION

## 2022-05-15 NOTE — Progress Notes (Signed)
Internal Medicine Clinic Attending  I saw and evaluated the patient.  I personally confirmed the key portions of the history and exam documented by Dr. Harper and I reviewed pertinent patient test results.  The assessment, diagnosis, and plan were formulated together and I agree with the documentation in the resident's note.  

## 2022-05-16 NOTE — Progress Notes (Signed)
Internal Medicine Clinic Attending  Case and documentation of Dr. Harper  at the time of the visit reviewed.  I reviewed the AWV findings.  I agree with the assessment, diagnosis, and plan of care documented in the AWV note.     

## 2022-05-19 LAB — HM DIABETES EYE EXAM

## 2022-05-22 ENCOUNTER — Other Ambulatory Visit (HOSPITAL_COMMUNITY): Payer: Self-pay

## 2022-05-23 ENCOUNTER — Other Ambulatory Visit (HOSPITAL_COMMUNITY): Payer: Self-pay

## 2022-07-31 ENCOUNTER — Encounter: Payer: Medicare Other | Admitting: Student

## 2022-08-02 ENCOUNTER — Encounter: Payer: Medicare Other | Admitting: Student

## 2022-08-15 ENCOUNTER — Other Ambulatory Visit: Payer: Self-pay

## 2022-08-15 ENCOUNTER — Encounter: Payer: Self-pay | Admitting: Student

## 2022-08-15 ENCOUNTER — Ambulatory Visit (INDEPENDENT_AMBULATORY_CARE_PROVIDER_SITE_OTHER): Payer: Medicare Other | Admitting: Student

## 2022-08-15 ENCOUNTER — Encounter: Payer: Self-pay | Admitting: Dietician

## 2022-08-15 VITALS — BP 116/78 | HR 84 | Temp 98.1°F | Ht 63.0 in | Wt 158.8 lb

## 2022-08-15 DIAGNOSIS — I10 Essential (primary) hypertension: Secondary | ICD-10-CM

## 2022-08-15 DIAGNOSIS — Z13820 Encounter for screening for osteoporosis: Secondary | ICD-10-CM

## 2022-08-15 DIAGNOSIS — R232 Flushing: Secondary | ICD-10-CM | POA: Diagnosis not present

## 2022-08-15 DIAGNOSIS — Z0000 Encounter for general adult medical examination without abnormal findings: Secondary | ICD-10-CM

## 2022-08-15 DIAGNOSIS — R634 Abnormal weight loss: Secondary | ICD-10-CM | POA: Diagnosis not present

## 2022-08-15 DIAGNOSIS — E1169 Type 2 diabetes mellitus with other specified complication: Secondary | ICD-10-CM | POA: Diagnosis not present

## 2022-08-15 DIAGNOSIS — Z794 Long term (current) use of insulin: Secondary | ICD-10-CM

## 2022-08-15 DIAGNOSIS — Z7984 Long term (current) use of oral hypoglycemic drugs: Secondary | ICD-10-CM

## 2022-08-15 DIAGNOSIS — E785 Hyperlipidemia, unspecified: Secondary | ICD-10-CM

## 2022-08-15 NOTE — Assessment & Plan Note (Signed)
Patient notes weight loss since her stroke in April 2023. 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 She states that she has cut out meats and her diet now primarily consists of salads, veggies, and fruits. I reassured patient, stating that her weight loss may be due to dietary changes as she is eating a healthier diet now as opposed to in the past (low salt content, more balanced diet).   She is complaining of experiencing a hot flash today. States that she has them rarely and they usually spontaneously resolve, but she has experienced this hot flash episode since this morning and it has not gone away. She is post-menopausal. No new medications or changes in medications to suggest a drug-induced cause. Given concomitant weight loss, will check TSH to ensure no thyroid abnormalities and a BMP to ensure no electrolyte abnormalities.  Plan: -f/u TSH and BMP -continue to trend weight at next visit

## 2022-08-15 NOTE — Patient Instructions (Signed)
Cheyenne Valenzuela,  It was a pleasure seeing you in the clinic today.   We are checking a couple of lab tests today -- I will call you with these results. I have ordered a bone scan to screen for bone thinning (osteoporosis). They will call you to schedule this. Please come back in 3 months for diabetes follow up.  Please call our clinic at [phone] if you have any questions or concerns. The best time to call is Monday-Friday from 9am-4pm, but there is someone available 24/7 at the same number. If you need medication refills, please notify your pharmacy one week in advance and they will send [place] a request.   Thank you for letting us take part in your care. We look forward to seeing you next time!

## 2022-08-15 NOTE — Assessment & Plan Note (Signed)
Patient living with HTN, currently taking hyzaar 100-25mg  daily and norvasc 10mg  daily. Her BP today is 116/78. She is at goal in terms of secondary prevention. Improvement in BP is also likely due to concomitant dietary changes (see "weight loss" tab). Continue current management.  Plan: -continue hyzaar and norvasc

## 2022-08-15 NOTE — Assessment & Plan Note (Signed)
Ordered DEXA scan today to screen for osteoporosis. 

## 2022-08-15 NOTE — Progress Notes (Signed)
   CC: f/u HTN, weight loss  HPI:  [name] is a 66 y.o. female with history listed below presenting to the [facility] for f/u of HTN, weight loss. Please see individualized problem based charting for full HPI.  Past Medical History:  Diagnosis Date   Cough 06/24/2021   Hypercholesteremia    Hypertension    Influenza A 11/21/2021   Stroke [facility])     Review of Systems:  Negative aside from that listed in individualized problem based charting.  Physical Exam:  Vitals:   08/15/22 1529  BP: 116/78  Pulse: 84  Temp: 98.1 F (36.7 C)  TempSrc: Oral  SpO2: 100%  Weight: 158 lb 12.8 oz (72 kg)  Height: 5\' 3"  (1.6 m)   Physical Exam Constitutional:      Appearance: Normal appearance. She is not ill-appearing.  HENT:     Mouth/Throat:     Mouth: Mucous membranes are moist.     Pharynx: Oropharynx is clear.  Cardiovascular:     Rate and Rhythm: Normal rate and regular rhythm.     Pulses: Normal pulses.     Heart sounds: Normal heart sounds. No murmur heard.    No friction rub. No gallop.  Pulmonary:     Effort: Pulmonary effort is normal.     Breath sounds: Normal breath sounds. No wheezing, rhonchi or rales.  Abdominal:     General: Bowel sounds are normal. There is no distension.     Palpations: Abdomen is soft.     Tenderness: There is no abdominal tenderness.  Musculoskeletal:        General: No swelling. Normal range of motion.  Skin:    General: Skin is warm and dry.  Neurological:     Mental Status: She is alert and oriented to person, place, and time. Mental status is at baseline.  Psychiatric:        Mood and Affect: Mood normal.        Behavior: Behavior normal.      Assessment & Plan:   See Encounters Tab for problem based charting.  Patient discussed with Dr. Philipp Ovens

## 2022-08-16 ENCOUNTER — Other Ambulatory Visit: Payer: Self-pay

## 2022-08-16 ENCOUNTER — Telehealth: Payer: Self-pay | Admitting: *Deleted

## 2022-08-16 ENCOUNTER — Encounter: Payer: Self-pay | Admitting: *Deleted

## 2022-08-16 ENCOUNTER — Emergency Department (HOSPITAL_BASED_OUTPATIENT_CLINIC_OR_DEPARTMENT_OTHER): Payer: Medicare Other

## 2022-08-16 ENCOUNTER — Emergency Department (HOSPITAL_BASED_OUTPATIENT_CLINIC_OR_DEPARTMENT_OTHER)
Admission: EM | Admit: 2022-08-16 | Discharge: 2022-08-16 | Disposition: A | Discharge status: Home / Self Care | Payer: Medicare Other | Attending: Emergency Medicine | Admitting: Emergency Medicine

## 2022-08-16 ENCOUNTER — Encounter (HOSPITAL_BASED_OUTPATIENT_CLINIC_OR_DEPARTMENT_OTHER): Payer: Self-pay | Admitting: Pediatrics

## 2022-08-16 DIAGNOSIS — Z7984 Long term (current) use of oral hypoglycemic drugs: Secondary | ICD-10-CM | POA: Insufficient documentation

## 2022-08-16 DIAGNOSIS — I10 Essential (primary) hypertension: Secondary | ICD-10-CM | POA: Diagnosis not present

## 2022-08-16 DIAGNOSIS — Z79899 Other long term (current) drug therapy: Secondary | ICD-10-CM | POA: Insufficient documentation

## 2022-08-16 DIAGNOSIS — N179 Acute kidney failure, unspecified: Secondary | ICD-10-CM | POA: Diagnosis not present

## 2022-08-16 DIAGNOSIS — E1165 Type 2 diabetes mellitus with hyperglycemia: Secondary | ICD-10-CM | POA: Insufficient documentation

## 2022-08-16 DIAGNOSIS — R112 Nausea with vomiting, unspecified: Secondary | ICD-10-CM | POA: Diagnosis not present

## 2022-08-16 DIAGNOSIS — R739 Hyperglycemia, unspecified: Secondary | ICD-10-CM

## 2022-08-16 DIAGNOSIS — Z7901 Long term (current) use of anticoagulants: Secondary | ICD-10-CM | POA: Diagnosis not present

## 2022-08-16 LAB — CBC
HCT: 37.5 % (ref 36.0–46.0)
Hemoglobin: 12.9 g/dL (ref 12.0–15.0)
MCH: 29.3 pg (ref 26.0–34.0)
MCHC: 34.4 g/dL (ref 30.0–36.0)
MCV: 85.2 fL (ref 80.0–100.0)
Platelets: 387 10*3/uL (ref 150–400)
RBC: 4.4 MIL/uL (ref 3.87–5.11)
RDW: 14.3 % (ref 11.5–15.5)
WBC: 9.4 10*3/uL (ref 4.0–10.5)
nRBC: 0 % (ref 0.0–0.2)

## 2022-08-16 LAB — HEPATIC FUNCTION PANEL
ALT: 20 U/L (ref 0–44)
AST: 19 U/L (ref 15–41)
Albumin: 3.6 g/dL (ref 3.5–5.0)
Alkaline Phosphatase: 106 U/L (ref 38–126)
Bilirubin, Direct: <0.1 mg/dL (ref 0.0–0.2)
Total Bilirubin: 0.5 mg/dL (ref 0.3–1.2)
Total Protein: 8.2 g/dL — ABNORMAL HIGH (ref 6.5–8.1)

## 2022-08-16 LAB — CBG MONITORING, ED
Glucose-Capillary: >600 mg/dL (ref 70–99)
Glucose-Capillary: 571 mg/dL (ref 70–99)
Glucose-Capillary: 306 mg/dL — ABNORMAL HIGH (ref 70–99)

## 2022-08-16 LAB — URINALYSIS, ROUTINE W REFLEX MICROSCOPIC
Bilirubin Urine: NEGATIVE
Glucose, UA: >=500 mg/dL — AB
Hgb urine dipstick: NEGATIVE
Ketones, ur: NEGATIVE mg/dL
Leukocytes,Ua: NEGATIVE
Nitrite: NEGATIVE
Protein, ur: NEGATIVE mg/dL
Specific Gravity, Urine: 1.01 (ref 1.005–1.030)
pH: 5.5 (ref 5.0–8.0)

## 2022-08-16 LAB — LIPASE, BLOOD: Lipase: 216 U/L — ABNORMAL HIGH (ref 11–51)

## 2022-08-16 LAB — BMP8+ANION GAP
Anion Gap: 19 mmol/L — ABNORMAL HIGH (ref 10.0–18.0)
BUN/Creatinine Ratio: 19 (ref 12–28)
BUN: 37 mg/dL — ABNORMAL HIGH (ref 8–27)
CO2: 23 mmol/L (ref 20–29)
Calcium: 10.3 mg/dL (ref 8.7–10.3)
Chloride: 83 mmol/L — ABNORMAL LOW (ref 96–106)
Creatinine, Ser: 1.96 mg/dL — ABNORMAL HIGH (ref 0.57–1.00)
Glucose: 669 mg/dL (ref 70–99)
Potassium: 4.6 mmol/L (ref 3.5–5.2)
Sodium: 125 mmol/L — ABNORMAL LOW (ref 134–144)
eGFR: 28 mL/min/{1.73_m2} — ABNORMAL LOW (ref >59)

## 2022-08-16 LAB — URINALYSIS, MICROSCOPIC (REFLEX)

## 2022-08-16 LAB — BASIC METABOLIC PANEL
Anion gap: 9 (ref 5–15)
BUN: 39 mg/dL — ABNORMAL HIGH (ref 8–23)
CO2: 31 mmol/L (ref 22–32)
Calcium: 9.6 mg/dL (ref 8.9–10.3)
Chloride: 92 mmol/L — ABNORMAL LOW (ref 98–111)
Creatinine, Ser: 1.47 mg/dL — ABNORMAL HIGH (ref 0.44–1.00)
GFR, Estimated: 39 mL/min — ABNORMAL LOW (ref >60)
Glucose, Bld: 408 mg/dL — ABNORMAL HIGH (ref 70–99)
Potassium: 3.6 mmol/L (ref 3.5–5.1)
Sodium: 132 mmol/L — ABNORMAL LOW (ref 135–145)

## 2022-08-16 LAB — TSH: TSH: 0.344 u[IU]/mL — ABNORMAL LOW (ref 0.450–4.500)

## 2022-08-16 MED ORDER — LACTATED RINGERS IV BOLUS: 20.0000 mL/kg | Freq: Once | INTRAVENOUS | Status: DC

## 2022-08-16 MED ORDER — INSULIN ASPART 100 UNIT/ML IJ SOLN
10.0000 [IU] | Freq: Once | INTRAMUSCULAR | Status: AC
  Administered 2022-08-16: 10 [IU] via SUBCUTANEOUS

## 2022-08-16 MED ORDER — DEXTROSE 50 % IV SOLN: 0.0000 mL | INTRAVENOUS | Status: DC | PRN

## 2022-08-16 MED ORDER — LACTATED RINGERS IV SOLN: INTRAVENOUS | Status: DC

## 2022-08-16 MED ORDER — INSULIN REGULAR(HUMAN) IN NACL 100-0.9 UT/100ML-% IV SOLN: INTRAVENOUS | Status: DC

## 2022-08-16 MED ORDER — LACTATED RINGERS IV BOLUS
2000.0000 mL | Freq: Once | INTRAVENOUS | Status: AC
  Administered 2022-08-16: 1000 mL via INTRAVENOUS

## 2022-08-16 MED ORDER — DEXTROSE IN LACTATED RINGERS 5 % IV SOLN: INTRAVENOUS | Status: DC

## 2022-08-16 MED ORDER — POTASSIUM CHLORIDE 10 MEQ/100ML IV SOLN: 10.0000 meq | INTRAVENOUS | Status: DC

## 2022-08-16 NOTE — Telephone Encounter (Signed)
Call from Clydie Braun with [employer]  Glucose done on yesterday is 669.  To give report to Dr. Austin Miles.

## 2022-08-16 NOTE — ED Provider Notes (Signed)
[facility] EMERGENCY DEPARTMENT Provider Note   CSN: [id number] Arrival date & time: 08/16/22  1649     History  Chief Complaint  Patient presents with   Hyperglycemia    Cheyenne Valenzuela is a 66 y.o. female history of diabetes, hypertension, here presenting with hyperglycemia.  Patient states that she has been drinking some sweet tea and has been urinating frequently.  She states that she has been a little lightheaded dizzy.  She went to internal medicine clinic yesterday.  She was found to have a blood glucose of 690 and an anion gap of 19.  She had her labs back today and was told to drink plenty of fluids and avoid any sweet drinks and come to the ER.  Patient denies any abdominal pain or vomiting or fever.  Patient states that she does not check her blood sugar at home.  She was also noted to have low TSH as well.  The history is provided by the patient.       Home Medications Prior to Admission medications   Medication Sig Start Date End Date Taking? Authorizing Provider  amLODipine (NORVASC) 10 MG tablet Take 1 tablet (10 mg total) by mouth daily. 05/12/22   Cheyenne Sickles, MD  apixaban (ELIQUIS) 5 MG TABS tablet Take 1 tablet (5 mg total) by mouth 2 (two) times daily. 04/24/22   Cheyenne Prude, MD  atorvastatin (LIPITOR) 80 MG tablet Take 1 tablet (80 mg total) by mouth daily. 05/12/22   Cheyenne Sickles, MD  empagliflozin (JARDIANCE) 10 MG TABS tablet Take 1 tablet (10 mg total) by mouth daily before breakfast. 05/12/22   Cheyenne Sickles, MD  ezetimibe (ZETIA) 10 MG tablet Take 1 tablet (10 mg total) by mouth daily. 05/12/22 11/08/22  Cheyenne Sickles, MD  losartan-hydrochlorothiazide (HYZAAR) 100-25 MG tablet Take 1 tablet by mouth daily. 05/12/22   Cheyenne Sickles, MD  metFORMIN (GLUCOPHAGE) 1000 MG tablet Take 1 tablet (1,000 mg total) by mouth 2 (two) times daily with a meal. 05/12/22   Cheyenne Sickles, MD      Allergies    Lisinopril    Review of Systems   Review of Systems   Neurological:  Positive for weakness.  All other systems reviewed and are negative.   Physical Exam Updated Vital Signs BP 135/83   Pulse 87   Temp 98.3 F (36.8 C) (Oral)   Resp 13   Ht 5\' 3"  (1.6 m)   Wt 70.8 kg   SpO2 98%   BMI 27.63 kg/m  Physical Exam Vitals and nursing note reviewed.  HENT:     Head: Normocephalic.     Nose: Nose normal.     Mouth/Throat:     Mouth: Mucous membranes are dry.  Eyes:     Extraocular Movements: Extraocular movements intact.     Pupils: Pupils are equal, round, and reactive to light.  Cardiovascular:     Rate and Rhythm: Normal rate and regular rhythm.     Pulses: Normal pulses.     Heart sounds: Normal heart sounds.  Pulmonary:     Effort: Pulmonary effort is normal.     Breath sounds: Normal breath sounds.  Abdominal:     General: Abdomen is flat.     Palpations: Abdomen is soft.  Musculoskeletal:        General: Normal range of motion.     Cervical back: Normal range of motion and neck supple.  Skin:    General: Skin is warm.  Capillary Refill: Capillary refill takes less than 2 seconds.  Neurological:     General: No focal deficit present.     Mental Status: She is alert and oriented to person, place, and time.  Psychiatric:        Mood and Affect: Mood normal.        Behavior: Behavior normal.     ED Results / Procedures / Treatments   Labs (all labs ordered are listed, but only abnormal results are displayed) Labs Reviewed  BASIC METABOLIC PANEL - Abnormal; Notable for the following components:      Result Value   Sodium 125 (*)    Chloride 86 (*)    Glucose, Bld 669 (*)    BUN 45 (*)    Creatinine, Ser 1.71 (*)    GFR, Estimated 33 (*)    All other components within normal limits  CBG MONITORING, ED - Abnormal; Notable for the following components:   Glucose-Capillary >600 (*)    All other components within normal limits  CBC  URINALYSIS, ROUTINE W REFLEX MICROSCOPIC  BETA-HYDROXYBUTYRIC ACID   BETA-HYDROXYBUTYRIC ACID  BLOOD GAS, VENOUS  HEPATIC FUNCTION PANEL  LIPASE, BLOOD  T4, FREE  T3, FREE    EKG None  Radiology No results found.  Procedures Procedures    Medications Ordered in ED Medications  lactated ringers infusion (has no administration in time range)  insulin aspart (novoLOG) injection 10 Units (has no administration in time range)  lactated ringers bolus 2,000 mL (1,000 mLs Intravenous New Bag/Given 08/16/22 1815)    ED Course/ Medical Decision Making/ A&P                           Medical Decision Making Cheyenne Valenzuela is a 66 y.o. female here presenting with hyperglycemia.  Patient has been drinking some sweet drinks recently.  Patient does not check her sugar regularly.  I reviewed labs from yesterday.  Patient has again a gap of 19.  Consider DKA versus HHS.  Patient is mentating well and now vomiting and afebrile.  I discussed her at length about treatment options.  She states that she does not want to stay in the hospital and rather get IV fluids and insulin and go home.  I will recheck her labs and also hydrate her and give her subcu insulin and recheck her sugar.  7:30 pm I reviewed patient's labs.  Initially her glucose was greater than 600.  On her chemistry her creatinine is 1.7 and sodium is 125 and glucose of 669.  Her anion gap is 13.  I discussed case with chief resident, Cheyenne Valenzuela, from internal medicine. Patient is adamant that she does not want to get admitted.  We plan to give her more IV fluids and insulin and recheck a BMP and if her sugar is between 3-400, anticipate discharge home and he will set up appointment for her this week  9:43 PM Patient received second liter and another 10 units of insulin glucose is down to 306.  Repeat blood work shows sodium of 132 and glucose of 408 and creatinine of 1.5. Patient is now feeling better.  I told her that office will contact her tomorrow to get an appointment this week to discuss diabetes  management.  Of note, her lipase is elevated at 200.  I performed a CT abdomen pelvis that did not showed any pancreatic mass or signs of pancreatitis and she has no abdominal tenderness.  Patient is stable for discharge.  Problems Addressed: AKI (acute kidney injury) (HCC): acute illness or injury Hyperglycemia: acute illness or injury  Amount and/or Complexity of Data Reviewed Labs: ordered. Decision-making details documented in ED Course. Radiology: ordered and independent interpretation performed. Decision-making details documented in ED Course.  Risk Prescription drug management.    Final Clinical Impression(s) / ED Diagnoses Final diagnoses:  None    Rx / DC Orders ED Discharge Orders     None         Charlynne Pander, MD 08/16/22 2144

## 2022-08-16 NOTE — Progress Notes (Signed)
Patient called.  Patient aware. Discussed significantly elevated glucose readings along with AKI. She does have an anion gap but normal bicarbonate level is reassuring (not acidotic). She also has low TSH. She does not have insulin at home and thus I have advised for her to drink 2L of water and to go to the ED for further evaluation (likely will need IVF and insulin). Would also benefit from free T4 and free T3 to ensure no hyperthyroidism. She understands the importance of going to the ED and will go immediately.

## 2022-08-16 NOTE — Progress Notes (Signed)
Discussed with Dr. Silverio Lay ED at [facility] in regards to her severe hyperglycemia and her not wanting to be admitted to the hospital. Message sent to front desk staff about scheduling patient to be seen sometime this week. Per last office note seems she may not be adherent to her medication regimen.

## 2022-08-16 NOTE — ED Notes (Signed)
Md notified of elevated bp.  Patient denies any shortness of breath.  Denies chest pain.  Lungs remain clear on exam

## 2022-08-16 NOTE — Discharge Instructions (Addendum)
Stay hydrated and please avoid any sugary drinks  The internal medicine clinic will contact you tomorrow and we will get you in for an appointment within the next 1 to 2 days.  Return to ER if you have abdominal pain or vomiting or dizziness or glucose greater than 600.

## 2022-08-16 NOTE — ED Notes (Signed)
Patient reports she has lost 10 pounds in the past month.  She has noticed that she is thirsty and voiding more.  She has noticed a change in how food tastes as well.  Patient is alert and oriented.  Her family has been to the bedside.  The patient's goal is to go home.  She does not want to be admitted.

## 2022-08-16 NOTE — ED Triage Notes (Signed)
Reported was sent by Dr. Lenise Arena for dizziness and needing fluids; stated she had labs done yesterday and was told she was dehydrated.

## 2022-08-16 NOTE — Progress Notes (Signed)
Vbg results within normal range, RN made aware

## 2022-08-17 LAB — BASIC METABOLIC PANEL
Anion gap: 13 (ref 5–15)
BUN: 45 mg/dL — ABNORMAL HIGH (ref 8–23)
CO2: 26 mmol/L (ref 22–32)
Calcium: 10 mg/dL (ref 8.9–10.3)
Chloride: 86 mmol/L — ABNORMAL LOW (ref 98–111)
Creatinine, Ser: 1.71 mg/dL — ABNORMAL HIGH (ref 0.44–1.00)
GFR, Estimated: 33 mL/min — ABNORMAL LOW (ref >60)
Glucose, Bld: 669 mg/dL (ref 70–99)
Potassium: 4 mmol/L (ref 3.5–5.1)
Sodium: 125 mmol/L — ABNORMAL LOW (ref 135–145)

## 2022-08-17 LAB — I-STAT VENOUS BLOOD GAS, ED
Acid-Base Excess: 4 mmol/L — ABNORMAL HIGH (ref 0.0–2.0)
Bicarbonate: 28.6 mmol/L — ABNORMAL HIGH (ref 20.0–28.0)
Calcium, Ion: 1.33 mmol/L (ref 1.15–1.40)
HCT: 40 % (ref 36.0–46.0)
Hemoglobin: 13.6 g/dL (ref 12.0–15.0)
O2 Saturation: 53 %
Patient temperature: 98.6
Potassium: 3.9 mmol/L (ref 3.5–5.1)
Sodium: 126 mmol/L — ABNORMAL LOW (ref 135–145)
TCO2: 30 mmol/L (ref 22–32)
pCO2, Ven: 42.2 mmHg — ABNORMAL LOW (ref 44–60)
pH, Ven: 7.44 — ABNORMAL HIGH (ref 7.25–7.43)
pO2, Ven: 27 mmHg — CL (ref 32–45)

## 2022-08-17 LAB — T3, FREE: T3, Free: 1.9 pg/mL — ABNORMAL LOW (ref 2.0–4.4)

## 2022-08-17 LAB — BETA-HYDROXYBUTYRIC ACID: Beta-Hydroxybutyric Acid: 0.64 mmol/L — ABNORMAL HIGH (ref 0.05–0.27)

## 2022-08-17 LAB — T4, FREE: Free T4: 1.13 ng/dL — ABNORMAL HIGH (ref 0.61–1.12)

## 2022-08-17 NOTE — Progress Notes (Signed)
Internal Medicine Clinic Attending  Case discussed with Dr. Jinwala  At the time of the visit.  We reviewed the resident's history and exam and pertinent patient test results.  I agree with the assessment, diagnosis, and plan of care documented in the resident's note.  

## 2022-08-18 ENCOUNTER — Ambulatory Visit (INDEPENDENT_AMBULATORY_CARE_PROVIDER_SITE_OTHER): Payer: Medicare Other | Admitting: Student

## 2022-08-18 ENCOUNTER — Other Ambulatory Visit: Payer: Self-pay

## 2022-08-18 ENCOUNTER — Encounter: Payer: Self-pay | Admitting: Student

## 2022-08-18 VITALS — BP 145/78 | HR 82 | Temp 97.8°F | Ht 63.0 in | Wt 159.1 lb

## 2022-08-18 DIAGNOSIS — Z8673 Personal history of transient ischemic attack (TIA), and cerebral infarction without residual deficits: Secondary | ICD-10-CM

## 2022-08-18 DIAGNOSIS — E1169 Type 2 diabetes mellitus with other specified complication: Secondary | ICD-10-CM

## 2022-08-18 DIAGNOSIS — I10 Essential (primary) hypertension: Secondary | ICD-10-CM | POA: Diagnosis not present

## 2022-08-18 DIAGNOSIS — E119 Type 2 diabetes mellitus without complications: Secondary | ICD-10-CM

## 2022-08-18 DIAGNOSIS — Z7984 Long term (current) use of oral hypoglycemic drugs: Secondary | ICD-10-CM

## 2022-08-18 DIAGNOSIS — E785 Hyperlipidemia, unspecified: Secondary | ICD-10-CM

## 2022-08-18 DIAGNOSIS — I4891 Unspecified atrial fibrillation: Secondary | ICD-10-CM

## 2022-08-18 DIAGNOSIS — Z794 Long term (current) use of insulin: Secondary | ICD-10-CM

## 2022-08-18 LAB — POCT GLYCOSYLATED HEMOGLOBIN (HGB A1C): HbA1c POC (<> result, manual entry): >14 % — AB (ref 4.0–5.6)

## 2022-08-18 LAB — GLUCOSE, CAPILLARY: Glucose-Capillary: 271 mg/dL — ABNORMAL HIGH (ref 70–99)

## 2022-08-18 MED ORDER — INSULIN GLARGINE 100 UNIT/ML SOLOSTAR PEN: 10.0000 [IU] | PEN_INJECTOR | Freq: Every day | SUBCUTANEOUS | 1 refills | Status: DC

## 2022-08-18 MED ORDER — METFORMIN HCL 1000 MG PO TABS: 1000.0000 mg | ORAL_TABLET | Freq: Two times a day (BID) | ORAL | 2 refills | Status: DC

## 2022-08-18 MED ORDER — GLUCOSE BLOOD VI STRP: ORAL_STRIP | 12 refills | Status: DC

## 2022-08-18 MED ORDER — PEN NEEDLES 32G X 4 MM MISC: 1.0000 | Freq: Every day | 1 refills | Status: AC

## 2022-08-18 MED ORDER — EMPAGLIFLOZIN 10 MG PO TABS: 10.0000 mg | ORAL_TABLET | Freq: Every day | ORAL | 1 refills | Status: DC

## 2022-08-18 MED ORDER — AMLODIPINE BESYLATE 10 MG PO TABS: 10.0000 mg | ORAL_TABLET | Freq: Every day | ORAL | 11 refills | Status: DC

## 2022-08-18 MED ORDER — EZETIMIBE 10 MG PO TABS: 10.0000 mg | ORAL_TABLET | Freq: Every day | ORAL | 1 refills | Status: DC

## 2022-08-18 MED ORDER — ONETOUCH ULTRASOFT LANCETS MISC: 12 refills | Status: DC

## 2022-08-18 MED ORDER — ATORVASTATIN CALCIUM 80 MG PO TABS: 80.0000 mg | ORAL_TABLET | Freq: Every day | ORAL | 2 refills | Status: DC

## 2022-08-18 MED ORDER — APIXABAN 5 MG PO TABS: 5.0000 mg | ORAL_TABLET | Freq: Two times a day (BID) | ORAL | 11 refills | Status: DC

## 2022-08-18 MED ORDER — LOSARTAN POTASSIUM-HCTZ 100-25 MG PO TABS: 1.0000 | ORAL_TABLET | Freq: Every day | ORAL | 2 refills | Status: DC

## 2022-08-18 NOTE — Progress Notes (Signed)
   CC: ED f/u for hyperglycemia  HPI:  Cheyenne Valenzuela is a 66 y.o. Female with history listed below presenting to the [facility] for ED f/u for hyperglycemia. Please see individualized problem based charting for full HPI.  Past Medical History:  Diagnosis Date   Cough 06/24/2021   Hypercholesteremia    Hypertension    Influenza A 11/21/2021   Stroke [facility])     Review of Systems:  Negative aside from that listed in individualized problem based charting.  Physical Exam:  Vitals:   08/18/22 1059  BP: (!) 149/74  Pulse: 83  Temp: 97.8 F (36.6 C)  TempSrc: Oral  SpO2: 100%  Weight: 159 lb 1.6 oz (72.2 kg)  Height: 5\' 3"  (1.6 m)   Physical Exam Constitutional:      Appearance: Normal appearance. She is not ill-appearing.  HENT:     Nose: Nose normal. No congestion.     Mouth/Throat:     Mouth: Mucous membranes are moist.     Pharynx: Oropharynx is clear. No oropharyngeal exudate.  Eyes:     Extraocular Movements: Extraocular movements intact.     Conjunctiva/sclera: Conjunctivae normal.     Pupils: Pupils are equal, round, and reactive to light.  Cardiovascular:     Rate and Rhythm: Normal rate and regular rhythm.     Pulses: Normal pulses.     Heart sounds: Normal heart sounds. No murmur heard.    No friction rub. No gallop.  Pulmonary:     Effort: Pulmonary effort is normal.     Breath sounds: Normal breath sounds. No wheezing, rhonchi or rales.  Abdominal:     General: Bowel sounds are normal. There is no distension.     Palpations: Abdomen is soft.     Tenderness: There is no abdominal tenderness.  Musculoskeletal:        General: No swelling. Normal range of motion.  Skin:    General: Skin is warm and dry.     Comments: No wounds or ulcerations on bilateral feet  Neurological:     General: No focal deficit present.     Mental Status: She is alert and oriented to person, place, and time.  Psychiatric:        Mood and Affect: Mood normal.        Behavior:  Behavior normal.      Assessment & Plan:   See Encounters Tab for problem based charting.  Patient discussed with Dr. 

## 2022-08-18 NOTE — Assessment & Plan Note (Addendum)
Patient seen at [facility] earlier this week for weight loss and a BMP was obtained which revealed profound hyperglycemia (>600) and AKI. She was subsequently advised to go to the ED where she was given insulin and hydration prior to being discharged with outpatient follow up. Fortunately, did not experience DKA.   She presents today for follow up. She has remained adherent with her metformin and jardiance. Considered whether her profound hyperglycemia could have been from an infectious cause. She denies any systemic symptoms and her exam is grossly unremarkable for an infectious etiology. A1c >14% today. We discussed need to initiate insulin therapy to obtain better glycemic control. We agreed to initiate lantus at 10u qhs and to use a glucometer (she has Verio IQ at home) to check fasting sugars every morning. We discussed starting a GLP-1a for better glycemic control as well, but she would like to start one at a time which is reasonable. Will have her follow up in 2 weeks to check glucometer, adjust insulin dose, and consider addition of GLP-1a therapy at that time. Will also place referral to [facility] Leash for diabetes counseling.  Plan: -continue metformin and jardiance -start lantus 10u qhs (also prescribed pen needles) -check fasting glucose levels daily (has Verio IQ glucometer; prescribed lancets and test strips) -f/u with diabetes counselor, Lupita Leash -f/u in 2 weeks (review glucometer, adjust insulin, consider addition of GLP-1a therapy)

## 2022-08-18 NOTE — Patient Instructions (Addendum)
Ms. Sassano,  It was a pleasure seeing you in the clinic today.   I have prescribed long-acting insulin (lantus). Please start taking 10 units every night. When you arrive at the pharmacy, ask them to demonstrate how to self-inject insulin. I have also ordered pen needles (for insulin) along with test strips and lancets (to use with Verio glucose meter). Please check your sugar level every morning before breakfast. Bring your glucose meter to your next visit in 2 weeks. I have placed a referral to our diabetes counselor Lupita Leash) to help talk to you more about diabetes management. Please come back in 2 weeks for your next visit.   Please call our clinic at [phone] if you have any questions or concerns. The best time to call is Monday-Friday from 9am-4pm, but there is someone available 24/7 at the same number. If you need medication refills, please notify your pharmacy one week in advance and they will send [place] a request.   Thank you for letting us take part in your care. We look forward to seeing you next time!

## 2022-08-18 NOTE — Assessment & Plan Note (Signed)
BP elevated at 149/74, but patient did not take her antihypertensives this morning prior to visit. Will avoid making any changes at this time.   -continue hyzaar 100-25mg  daily and norvasc 10mg  daily

## 2022-08-21 ENCOUNTER — Telehealth: Payer: Self-pay | Admitting: Student

## 2022-08-21 DIAGNOSIS — E1169 Type 2 diabetes mellitus with other specified complication: Secondary | ICD-10-CM

## 2022-08-21 MED ORDER — ACCU-CHEK SOFTCLIX LANCETS MISC: 3 refills | Status: DC

## 2022-08-21 MED ORDER — ACCU-CHEK GUIDE VI STRP: ORAL_STRIP | 3 refills | Status: DC

## 2022-08-21 MED ORDER — ACCU-CHEK GUIDE W/DEVICE KIT: PACK | 1 refills | Status: DC

## 2022-08-21 NOTE — Telephone Encounter (Signed)
I think [employer] preferred meter is the Accucheck Guide. Do you want help putting that in?

## 2022-08-21 NOTE — Telephone Encounter (Signed)
Pt states she went to the pharamacy to have the following prescription filled per Dr. Austin Miles on 08/18/2022 per below but the pharamacy did not have it.  The patient is requesting a new Machine that will cover her Lancets and Strips since the  one called in below  are unavailable.   -(has Verio IQ glucometer; prescribed lancets and test strips)

## 2022-08-25 NOTE — Progress Notes (Signed)
Internal Medicine Clinic Attending  Case discussed with Dr. Jinwala  At the time of the visit.  We reviewed the resident's history and exam and pertinent patient test results.  I agree with the assessment, diagnosis, and plan of care documented in the resident's note.  

## 2022-08-30 ENCOUNTER — Telehealth: Payer: Self-pay | Admitting: *Deleted

## 2022-08-30 NOTE — Telephone Encounter (Signed)
Call to Ms. Celmer per triage: she has been doing airshots with 2 units and then her 10 units of Lantus each day, but she was only using each pen twice then throwing it way.  She only has one pen left with about 60 units in it and verbalizes now after our discussion how to tell when th pen is empty. The pen she has should last until her appointment on 09/05/22. I told her we'd figure things out between pharmacy and our samples how to help her with her medicine then.   Her blood sugars are much better at 118, today 150 yesterday am and pretty much staying in the 100s in the am. After work in the pm, it might be in the 200s, it has not been over 325.  I answered her questions about meal planning. She is eating a generally healthy diet but appropriately is trying not to let her weight decrease as fast. (She had lost 11 # before being diagnosed with diabetes last month.) Wt Readings from Last 10 Encounters:  08/18/22 159 lb 1.6 oz (72.2 kg)  08/16/22 156 lb (70.8 kg)  08/15/22 158 lb 12.8 oz (72 kg)  05/12/22 170 lb 13.7 oz (77.5 kg)  05/12/22 170 lb 14.4 oz (77.5 kg)  04/24/22 169 lb 6.4 oz (76.8 kg)  04/12/22 172 lb 9.6 oz (78.3 kg)  03/21/22 172 lb (78 kg)  03/09/22 170 lb 8 oz (77.3 kg)  03/03/22 170 lb (77.1 kg)   Call to pharmacy to see if they can do a one time override and give her another refill early. They cannot, but he suggested patient call the number on her card and explain what happened and they may do it for her. He said it will not let him refill it until  February 12th.  Patient called and she agreed to call her insurance company to see if they would approve and override.

## 2022-08-30 NOTE — Telephone Encounter (Signed)
Patient currently speaking with Diabetic Educator to learn how patient has been taking insulin.

## 2022-08-30 NOTE — Telephone Encounter (Signed)
Patient called in stating she is running out of Lantus and Pharmacy will not give her more. 15 mL sent on 11/10 with 1 refill. Spoke with Nedra Hai at [employer]. States patient p/u 5 pens on 11/10 and this is a 90 day supply. She will not be able to p/u more till Feb 2024.

## 2022-09-05 ENCOUNTER — Encounter: Payer: Self-pay | Admitting: Dietician

## 2022-09-05 ENCOUNTER — Ambulatory Visit (INDEPENDENT_AMBULATORY_CARE_PROVIDER_SITE_OTHER): Payer: Medicare Other | Admitting: Internal Medicine

## 2022-09-05 ENCOUNTER — Ambulatory Visit (INDEPENDENT_AMBULATORY_CARE_PROVIDER_SITE_OTHER): Payer: Medicare Other | Admitting: Dietician

## 2022-09-05 VITALS — BP 144/75 | HR 77 | Temp 97.7°F

## 2022-09-05 VITALS — Wt 163.5 lb

## 2022-09-05 DIAGNOSIS — E785 Hyperlipidemia, unspecified: Secondary | ICD-10-CM

## 2022-09-05 DIAGNOSIS — E1169 Type 2 diabetes mellitus with other specified complication: Secondary | ICD-10-CM | POA: Diagnosis not present

## 2022-09-05 DIAGNOSIS — H538 Other visual disturbances: Secondary | ICD-10-CM | POA: Diagnosis not present

## 2022-09-05 DIAGNOSIS — Z7984 Long term (current) use of oral hypoglycemic drugs: Secondary | ICD-10-CM

## 2022-09-05 DIAGNOSIS — Z794 Long term (current) use of insulin: Secondary | ICD-10-CM | POA: Diagnosis not present

## 2022-09-05 MED ORDER — EMPAGLIFLOZIN 25 MG PO TABS: 25.0000 mg | ORAL_TABLET | Freq: Every day | ORAL | 11 refills | Status: DC

## 2022-09-05 NOTE — Progress Notes (Signed)
CC: diabetes  HPI:  Cheyenne Valenzuela is a 66 y.o. female living with a history stated below and presents today for a 2 week follow up of her diabetes. Please see problem based assessment and plan for additional details.  Past Medical History:  Diagnosis Date   Cough 06/24/2021   Hypercholesteremia    Hypertension    Influenza A 11/21/2021   Stroke [facility])     Current Outpatient Medications on File Prior to Visit  Medication Sig Dispense Refill   Accu-Chek Softclix Lancets lancets Check blood sugar 3 times per day. 300 each 3   amLODipine (NORVASC) 10 MG tablet Take 1 tablet (10 mg total) by mouth daily. 30 tablet 11   apixaban (ELIQUIS) 5 MG TABS tablet Take 1 tablet (5 mg total) by mouth 2 (two) times daily. 60 tablet 11   atorvastatin (LIPITOR) 80 MG tablet Take 1 tablet (80 mg total) by mouth daily. 100 tablet 2   Blood Glucose Monitoring Suppl (ACCU-CHEK GUIDE) w/Device KIT Check blood sugar 3 times per day. 1 kit 1   ezetimibe (ZETIA) 10 MG tablet Take 1 tablet (10 mg total) by mouth daily. 90 tablet 1   glucose blood (ACCU-CHEK GUIDE) test strip Check blood sugar 3 times per day 300 each 3   insulin glargine (LANTUS) 100 UNIT/ML Solostar Pen Inject 10 Units into the skin at bedtime. 15 mL 1   Insulin Pen Needle (PEN NEEDLES) 32G X 4 MM MISC 1 Needle by Does not apply route daily. 200 each 1   losartan-hydrochlorothiazide (HYZAAR) 100-25 MG tablet Take 1 tablet by mouth daily. 100 tablet 2   metFORMIN (GLUCOPHAGE) 1000 MG tablet Take 1 tablet (1,000 mg total) by mouth 2 (two) times daily with a meal. 200 tablet 2   No current facility-administered medications on file prior to visit.    Family History  Problem Relation Age of Onset   Diabetes Mother    Diabetes Sister     Social History   Socioeconomic History   Marital status: Single    Spouse name: Not on file   Number of children: 3   Years of education: Not on file   Highest education level: Some college, no  degree  Occupational History   Occupation: Retired  Tobacco Use   Smoking status: Never    Passive exposure: Never   Smokeless tobacco: Never  Substance and Sexual Activity   Alcohol use: No   Drug use: Never   Sexual activity: Not on file  Other Topics Concern   Not on file  Social History Narrative   Lives w daughter   Left handed   Caffeine: rarely, mainly juice      _____________________________________________         Breakfast - oatmeal with sugar, fruit, coffee or tea   Lunch - usually no lunch   Dinner - chicken or fish or [place], vegetables with broccoli and cheese, green beans, tomatoes, corn, butter, sometimes rice   Snacks - fruit   Drinks - water or tea, juice   Exercise - walking most days, 2.5 miles      Social Determinants of Health   Financial Resource Strain: [place]  (05/12/2022)   Overall Financial Resource Strain (CARDIA)    Difficulty of Paying Living Expenses: Not very hard  Food Insecurity: No Food Insecurity (08/15/2022)   Hunger Vital Sign    Worried About Running Out of Food in the Last Year: Never true    Ran Out of  Food in the Last Year: Never true  Transportation Needs: No Transportation Needs (08/15/2022)   PRAPARE - Hydrologist (Medical): No    Lack of Transportation (Non-Medical): No  Physical Activity: Inactive (05/12/2022)   Exercise Vital Sign    Days of Exercise per Week: 0 days    Minutes of Exercise per Session: 0 min  Stress: No Stress Concern Present (05/12/2022)   [place]    Feeling of Stress : Not at all  Social Connections: Moderately Integrated (05/12/2022)   Social Connection and Isolation Panel [NHANES]    Frequency of Communication with Friends and Family: Three times a week    Frequency of Social Gatherings with Friends and Family: Once a week    Attends Religious Services: More than 4 times per year    Active Member of [employer]  or Organizations: Yes    Attends Archivist Meetings: Never    Marital Status: Widowed  Intimate Partner Violence: Not At Risk (05/12/2022)   Humiliation, Afraid, Rape, and Kick questionnaire    Fear of Current or Ex-Partner: No    Emotionally Abused: No    Physically Abused: No    Sexually Abused: No    Review of Systems: ROS negative except for what is noted on the assessment and plan.  Vitals:   09/05/22 1528  BP: (!) 144/75  Pulse: 77  Temp: 97.7 F (36.5 C)  TempSrc: Oral    Physical Exam: Constitutional: well-appearing female sitting in chair, in no acute distress Eyes: PERRL, EOMI, limited peripheral vision L worse than R. Eyes are not red, not painful.  Cardiovascular: regular rate and rhythm, no m/r/g Pulmonary/Chest: normal work of breathing on room air, lungs clear to auscultation bilaterally Abdominal: soft, non-tender, non-distended MSK: normal bulk and tone Neurological: alert & oriented x 3 Skin: warm and dry Psych: normal mood and behavior  Assessment & Plan:     Patient discussed with Dr.  Saverio Danker  Type 2 diabetes mellitus with hyperlipidemia [facility]) The patient presents for 2-week follow-up of her diabetes.  At her last office visit, her A1c was noted to be >14%.  She had been on metformin and Jardiance, and was started on Lantus 10 units nightly due to her uncontrolled hyperglycemia.  The patient was advised to check her fasting glucose levels daily and to bring in her glucometer at this follow-up appointment today.  Additionally, prior to my encounter with the patient, she met with Butch Penny, our diabetes and nutrition counselor.   The patient's fasting sugars have ranged from 118-180 since starting on insulin.  She denies any symptoms of hypoglycemia, but does endorse blurry vision that started about 4 days ago.  The patient states that she is no longer able to drive because of her blurry vision and that it has affected her quality of life. She is not  able to read any of the pamphlets/paperwork presented to her today. On exam, the patient's pupils are equal and reactive to light and EOMI. Her eyes are not red or painful. Her vision is blurry, which is also limiting her peripheral vision.   Although the plan at the last office visit was to start on a GLP-1 agonist, with her ongoing blurry vision, we will defer this at this time as Ozempic has a risk of retinopathy.  I have advised the patient to call her ophthalmologist to get an appointment this week (currently she does not have an  appointment until December 14).  I will also call her ophthalmologist to see if I can help escalate this.  Plan: - Continue metformin 1 g bid - Increase Jardiance to 25 mg daily  - Continue Lantus 10u qhs - Could start ozempic 0.25 mg weekly once blurry vision improves/after patient sees ophtho - Urine micro today - Follow up in 2 weeks for appt with provider + Berton Mount, D.O. [facility], PGY-2 Phone: [phone] Date 09/05/2022 Time 4:19 PM

## 2022-09-05 NOTE — Assessment & Plan Note (Addendum)
The patient presents for 2-week follow-up of her diabetes.  At her last office visit, her A1c was noted to be >14%.  She had been on metformin and Jardiance, and was started on Lantus 10 units nightly due to her uncontrolled hyperglycemia.  The patient was advised to check her fasting glucose levels daily and to bring in her glucometer at this follow-up appointment today.  Additionally, prior to my encounter with the patient, she met with Butch Penny, our diabetes and nutrition counselor.   The patient's fasting sugars have ranged from 118-180 since starting on insulin.  She denies any symptoms of hypoglycemia, but does endorse blurry vision that started about 4 days ago.  The patient states that she is no longer able to drive because of her blurry vision and that it has affected her quality of life. She is not able to read any of the pamphlets/paperwork presented to her today. On exam, the patient's pupils are equal and reactive to light and EOMI. Her eyes are not red or painful. Her vision is blurry, which is also limiting her peripheral vision.   Although the plan at the last office visit was to start on a GLP-1 agonist, with her ongoing blurry vision, we will defer this at this time as Ozempic has a risk of retinopathy.  I have advised the patient to call her ophthalmologist to get an appointment this week (currently she does not have an appointment until December 14).  I will also call her ophthalmologist to see if I can help escalate this.  Plan: - Continue metformin 1 g bid - Increase Jardiance to 25 mg daily  - Continue Lantus 10u qhs - Could start ozempic 0.25 mg weekly once blurry vision improves/after patient sees ophtho - Urine micro today - Follow up in 2 weeks for appt with provider + Butch Penny  Addendum 11/29: Patient received a call back from her ophthalmologist, who recommended the patient get an MRI of her brain to further evaluate her vision changes.  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  Her ophthalmologist also stated that she can go to the emergency department to get this done, but without an acute concern for cerebral infarction at this time, we discussed that this is not necessarily indicated and that I can place an order for a stat MRI of her brain to be done.  Patient is in agreement with this plan.

## 2022-09-05 NOTE — Patient Instructions (Signed)
Cheyenne Valenzuela,   You are doing a great job caring for your diabetes!  Thank you for your call letting me know you were able to fill your prescription.   Today we began your Diabetes Self Management Education.  You have 9 more hours covered by your insurance over the next 12 months.  You also have 3 hours of nutrtional therapy covered at 100% as well.  PLease make a follow up in 2-3 weeks with me.   Lupita Leash [phone]

## 2022-09-05 NOTE — Patient Instructions (Addendum)
Thank you, Ms.Frederik Schmidt for allowing [place] to provide your care today. Today we discussed:  Diabetes: Keep taking metformin 1000 mg twice a day Increase your jardiance dose. STOP taking the 10 mg tablet and take the new, 25 mg tablet once a day Continue to use insulin: inject 10 units of lantus each night and check your blood sugars at least once a day!  Make an appointment to see [place] and Lupita Leash in a couple of weeks If you cannot get in touch with your ophthalmologist, please call us tomorrow. I will also call them for you  I have ordered the following labs for you:   Lab Orders         Microalbumin / Creatinine Urine Ratio       Referrals ordered today:   Referral Orders  No referral(s) requested today     I have ordered the following medication/changed the following medications:   Stop the following medications: Medications Discontinued During This Encounter  Medication Reason   empagliflozin (JARDIANCE) 10 MG TABS tablet      Start the following medications: Meds ordered this encounter  Medications   empagliflozin (JARDIANCE) 25 MG TABS tablet    Sig: Take 1 tablet (25 mg total) by mouth daily before breakfast.    Dispense:  30 tablet    Refill:  11     Follow up:  2 weeks     Should you have any questions or concerns please call the internal medicine clinic at [phone].     Elza Rafter, D.O. [facility]

## 2022-09-06 ENCOUNTER — Telehealth: Payer: Self-pay | Admitting: Dietician

## 2022-09-06 ENCOUNTER — Telehealth: Payer: Self-pay | Admitting: *Deleted

## 2022-09-06 DIAGNOSIS — E1169 Type 2 diabetes mellitus with other specified complication: Secondary | ICD-10-CM

## 2022-09-06 LAB — MICROALBUMIN / CREATININE URINE RATIO
Creatinine, Urine: 105.1 mg/dL
Microalb/Creat Ratio: 16 mg/g creat (ref 0–29)
Microalbumin, Urine: 17.1 ug/mL

## 2022-09-06 MED ORDER — ONETOUCH VERIO VI STRP: ORAL_STRIP | 3 refills | Status: AC

## 2022-09-06 MED ORDER — ONETOUCH DELICA PLUS LANCET33G MISC: 3 refills | Status: AC

## 2022-09-06 NOTE — Telephone Encounter (Signed)
Call from patient-stated that she contacted the Eye doctor as she was directed.  The Eye Doctor's office told her that she would need to get an MRI.  Patient would like to talk with Dr. Ned Card to see why she needs to have an MRI ordered.   Eye doctor's office told her to go to the ER to get 1 done.  Can be reached at [phone].

## 2022-09-06 NOTE — Telephone Encounter (Signed)
Correcting testing supplies to the [employer] which is what the patient is using.

## 2022-09-06 NOTE — Addendum Note (Signed)
Addended by: Dickie La on: 09/06/2022 09:56 PM   Modules accepted: Level of Service

## 2022-09-06 NOTE — Addendum Note (Signed)
Addended by: Elza Rafter on: 09/06/2022 05:01 PM   Modules accepted: Orders

## 2022-09-06 NOTE — Progress Notes (Signed)
Diabetes Self-Management Education  Visit Type: First/Initial  Appt. Start Time: 1415 Appt. End Time: [id number]  09/06/2022  [name], identified by name and date of birth, is a 66 y.o. female with a diagnosis of Diabetes: Type 2.   ASSESSMENT  Estimated body mass index is 28.96 kg/m as calculated from the following:   Height as of 08/18/22: 5\' 3"  (1.6 m).   Weight as of this encounter: 163 lb 8 oz (74.2 kg). Weight is increasing towards baseline. Encourage maintenance at current weight or gradual loss to ~ BMI of 26. She was concerned about weight loss, but it is likely due to dehydration and glycosuria. Vision blurry today impairing education using written materials.  Wt Readings from Last 10 Encounters:  09/06/22 163 lb 8 oz (74.2 kg)  08/18/22 159 lb 1.6 oz (72.2 kg)  08/16/22 156 lb (70.8 kg)  08/15/22 158 lb 12.8 oz (72 kg)  05/12/22 170 lb 13.7 oz (77.5 kg)  05/12/22 170 lb 14.4 oz (77.5 kg)  04/24/22 169 lb 6.4 oz (76.8 kg)  04/12/22 172 lb 9.6 oz (78.3 kg)  03/21/22 172 lb (78 kg)  03/09/22 170 lb 8 oz (77.3 kg)    Lab Results  Component Value Date   HGBA1C >14.0 (A) 08/18/2022   HGBA1C 9.0 (A) 05/12/2022   HGBA1C 7.8 (H) 02/04/2022   HGBA1C 6.5 (A) 12/12/2021   HGBA1C 6.5 (A) 08/29/2021    Meter download showed fasting 181/162/118/165 Postprandial: 134/173/150 over the past week   Diabetes Self-Management Education - 09/05/22 1400       Visit Information   Visit Type First/Initial      Initial Visit   Diabetes Type Type 2    Date Diagnosed 08/2022    Are you currently following a meal plan? Yes   well balanced meals   Are you taking your medications as prescribed? Yes      Health Coping   How would you rate your overall health? Good      Psychosocial Assessment   Patient Belief/Attitude about Diabetes Motivated to manage diabetes    What is the hardest part about your diabetes right now, causing you the most concern, or is the most worrisome to you  about your diabetes?   Checking blood sugar;Making healty food and beverage choices   she asid understnading the numbers she is getting and knowing what to eat   Self-care barriers Impaired vision   vision very blurry over past few weeks; cannot see pictures well is not reading anything even with readers, has an eye doctor appointment in December   Self-management support Family   daughter   Patient Concerns Glycemic Control;Healthy Lifestyle;Monitoring;Nutrition/Meal planning;Medication;Support    Special Needs Large print;Verbal instruction    Preferred Learning Style Auditory    Learning Readiness Change in progress    How often do you need to have someone help you when you read instructions, pamphlets, or other written materials from your doctor or pharmacy? 3 - Sometimes    What is the last grade level you completed in school? need to assess      Pre-Education Assessment   Patient understands the diabetes disease and treatment process. Needs Instruction    Patient understands incorporating nutritional management into lifestyle. Needs Instruction    Patient undertands incorporating physical activity into lifestyle. Needs Instruction    Patient understands using medications safely. Needs Instruction    Patient understands monitoring blood glucose, interpreting and using results Needs Instruction    Patient  understands prevention, detection, and treatment of acute complications. Needs Instruction    Patient understands prevention, detection, and treatment of chronic complications. --   need to assess   Patient understands how to develop strategies to address psychosocial issues. --   need to assess   Patient understands how to develop strategies to promote health/change behavior. --   need to assess     Complications   Last HgB A1C per patient/outside source 14 %    How often do you check your blood sugar? 1-2 times/day    Fasting Blood glucose range (mg/dL) 409-811;91-478    Postprandial  Blood glucose range (mg/dL) 295-621;308-657    Number of hypoglycemic episodes per month 0    Number of hyperglycemic episodes ( >200mg /dL): Occasional    Can you tell when your blood sugar is high? Yes    What do you do if your blood sugar is high? need to assess    Have you had a dilated eye exam in the past 12 months? Yes    Have you had a dental exam in the past 12 months? --   need to assess   Are you checking your feet? No      Dietary Intake   Breakfast instant flavored oatmeal   dislikes breakfast foods   Lunch need to assess more at future visit    Snack (afternoon) fruit    Dinner chicken fish, vegetables    Beverage(s) mostly water5, some milk especilaly when thirsty from high blood sugars      Activity / Exercise   Activity / Exercise Type ADL's;Light (walking / raking leaves)   limits wlkaing to her yard right now due to vision problems   How many days per week do you exercise? --   need to assess more at future visit     Patient Education   Previous Diabetes Education No    Healthy Eating Plate Method    Monitoring Identified appropriate SMBG and/or A1C goals.    Diabetes Stress and Support Identified and addressed patients feelings and concerns about diabetes    Preconception care --   not applicable     Individualized Goals (developed by patient)   Monitoring  Test my blood glucose as discussed      Outcomes   Expected Outcomes Demonstrated interest in learning. Expect positive outcomes    Future DMSE 2 wks    Program Status Not Completed   need to complete assessment, today focused on patient concerns            Individualized Plan for Diabetes Self-Management Training:   Learning Objective:  Patient will have a greater understanding of diabetes self-management. Patient education plan is to attend individual and/or group sessions per assessed needs and concerns.   Plan:   Patient Instructions  Cheyenne Valenzuela,   You are doing a great job caring for your  diabetes!  Thank you for your call letting me know you were able to fill your prescription.   Today we began your Diabetes Self Management Education.  You have 9 more hours covered by your insurance over the next 12 months.  You also have 3 hours of nutrtional therapy covered at 100% as well.  PLease make a follow up in 2-3 weeks with me.   Lupita Leash [phone]   Expected Outcomes:  Demonstrated interest in learning. Expect positive outcomes  Education material provided: Diabetes Resources  If problems or questions, patient to contact team via:  Phone  Future  DSME appointment: 2 wks [name], RD 09/06/2022 9:44 AM.

## 2022-09-06 NOTE — Progress Notes (Signed)
Internal Medicine Clinic Attending ° °Case discussed with Dr. Atway  At the time of the visit.  We reviewed the resident’s history and exam and pertinent patient test results.  I agree with the assessment, diagnosis, and plan of care documented in the resident’s note.  °

## 2022-09-07 NOTE — Progress Notes (Signed)
Urine microalbumin/cr ratio within normal limits. Patient called and made aware.

## 2022-09-09 ENCOUNTER — Ambulatory Visit (HOSPITAL_BASED_OUTPATIENT_CLINIC_OR_DEPARTMENT_OTHER)
Admission: RE | Admit: 2022-09-09 | Discharge: 2022-09-09 | Disposition: A | Discharge status: Home / Self Care | Payer: Medicare Other | Source: Ambulatory Visit | Attending: Internal Medicine | Admitting: Internal Medicine

## 2022-09-09 DIAGNOSIS — H538 Other visual disturbances: Secondary | ICD-10-CM | POA: Diagnosis present

## 2022-09-11 NOTE — Progress Notes (Signed)
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 Patient called and made aware. She has follow up with her ophthalmologist scheduled for Feb 2024.

## 2022-09-12 NOTE — Progress Notes (Unsigned)
[facility] [address] [place]. [place] [address] [phone]       HOSPITAL FOLLOW UP NOTE  [name] Date of Birth:  [birth year] Medical Record Number:  [id number]   Reason for Referral:  hospital stroke follow up   SUBJECTIVE:   CHIEF COMPLAINT:  No chief complaint on file.   HPI:   Cheyenne Valenzuela is a 66 y.o. who  has a past medical history of Cough (06/24/2021), Hypercholesteremia, Hypertension, Influenza A (11/21/2021), and Stroke [facility]).  Patient presented on 02/03/2022 with blurry vision, headache and poor balance for about a week. She waited for appt with PCP and was directed to the ER. CT suggested acute right PCA stroke. MRI confirmed. She was outside the window for tPA. Symptoms improved over the next 24 hours. PT/OT eval recommended outpatient therapy. She was discharged on DAPT with asa 351m and Plavix for 3 months.  Personally reviewed hospitalization pertinent progress notes, lab work and imaging.  Evaluated by Dr XErlinda Hong   Since discharge, she reports that she "is fine". She feels that she is back to baseline. She continues to have left sided blurry vision. She is planning to see ophthalmology in July. She has not driven since stroke due to feeling uneasy about vision deficit. Her youngest daughter lives at home with her. Her daughter, LLillie Valenzuela helps with transportation for MD visits. Patient does not work. She is able to perform ADLs independently.   She was seen by PCP 02/09/2022. BP well managed. Advised to continue losartan-HCTZ and amlodipine. She keeps a daily BP log. Home readings usually around 140/70's. She was started on Jardiance and metformin continued. Outpatient PT/OT evaluation 02/14/2022 did not exhibit any functional deficits. No further therapy advised. She is followed by cardiology for outpatient heart monitoring. Monitor alert 03/03/2022 for atrial fib/flutter. Mrs WSonntagwas contacted but not experiencing symptoms. She reports cleaning the  house around the time of the event. She was advised to continue monitoring. She is scheduled to return monitor today. Appt scheduled with Dr TRadford Paxin 04/2022.   She walks about a mile every other day for exercise.   UPDATE 09/13/2022 ALL: RSugarreturns for follow up for CVA.   She was seen in ER 08/16/2022 for CBG > 600. She was treated with fluids and insuling and discharged home. She was seen by Dr JAllyson Sabal11/07/2022. A1C 14. Lantus was added to metformin and Jardiance. Plan to add GLP discussed.   She is followed by cardiology for afib. Now on Eliquis.   PERTINENT IMAGING/LABS  MRI right PCA, right thalamic, bilateral cerebellum infarcts with small right PCA hemorrhagic transformation. 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  Left fetal PCA. 2D Echo EF 60 to 65%, no PFO   A1C Lab Results  Component Value Date   HGBA1C >14.0 (A) 08/18/2022    Lipid Panel     Component Value Date/Time   CHOL 243 (H) 05/12/2022 1029   TRIG 280 (H) 05/12/2022 1029   HDL 35 (L) 05/12/2022 1029   CHOLHDL 6.9 (H) 05/12/2022 1029   CHOLHDL 6.8 02/04/2022 0334   VLDL 47 (H) 02/04/2022 0334   LDLCALC 156 (H) 05/12/2022 1029   LABVLDL 52 (H) 05/12/2022 1029      ROS:   14 system review of systems performed and negative with exception of those listed in HPI  PMH:  Past Medical History:  Diagnosis Date   Cough 06/24/2021   Hypercholesteremia    Hypertension  Influenza A 11/21/2021   Stroke ([place])     PSH:  Past Surgical History:  Procedure Laterality Date   ABDOMINAL HYSTERECTOMY     APPENDECTOMY      Social History:  Social History   Socioeconomic History   Marital status: Single    Spouse name: Not on file   Number of children: 3   Years of education: Not on file   Highest education level: Some college, no degree  Occupational History   Occupation: Retired  Tobacco Use   Smoking status: Never    Passive exposure: Never    Smokeless tobacco: Never  Substance and Sexual Activity   Alcohol use: No   Drug use: Never   Sexual activity: Not on file  Other Topics Concern   Not on file  Social History Narrative   Lives w daughter   Left handed   Caffeine: rarely, mainly juice      _____________________________________________         Breakfast - oatmeal with sugar, fruit, coffee or tea   Lunch - usually no lunch   Dinner - chicken or fish or [place], vegetables with broccoli and cheese, green beans, tomatoes, corn, butter, sometimes rice   Snacks - fruit   Drinks - water or tea, juice   Exercise - walking most days, 2.5 miles      Social Determinants of Health   Financial Resource Strain: Low Risk  (05/12/2022)   Overall Financial Resource Strain (CARDIA)    Difficulty of Paying Living Expenses: Not very hard  Food Insecurity: No Food Insecurity (08/15/2022)   Hunger Vital Sign    Worried About Running Out of Food in the Last Year: Never true    [place] in the Last Year: Never true  Transportation Needs: No Transportation Needs (08/15/2022)   PRAPARE - Hydrologist (Medical): No    Lack of Transportation (Non-Medical): No  Physical Activity: Inactive (05/12/2022)   Exercise Vital Sign    Days of Exercise per Week: 0 days    Minutes of Exercise per Session: 0 min  Stress: No Stress Concern Present (05/12/2022)   [employer]    Feeling of Stress : Not at all  Social Connections: Moderately Integrated (05/12/2022)   Social Connection and Isolation Panel [NHANES]    Frequency of Communication with Friends and Family: Three times a week    Frequency of Social Gatherings with Friends and Family: Once a week    Attends Religious Services: More than 4 times per year    Active Member of [employer] or Organizations: Yes    Attends Archivist Meetings: Never    Marital Status: Widowed  Intimate Partner  Violence: Not At Risk (05/12/2022)   Humiliation, Afraid, Rape, and Kick questionnaire    Fear of Current or Ex-Partner: No    Emotionally Abused: No    Physically Abused: No    Sexually Abused: No    Family History:  Family History  Problem Relation Age of Onset   Diabetes Mother    Diabetes Sister     Medications:   Current Outpatient Medications on File Prior to Visit  Medication Sig Dispense Refill   amLODipine (NORVASC) 10 MG tablet Take 1 tablet (10 mg total) by mouth daily. 30 tablet 11   apixaban (ELIQUIS) 5 MG TABS tablet Take 1 tablet (5 mg total) by mouth 2 (two) times daily. 60 tablet 11  atorvastatin (LIPITOR) 80 MG tablet Take 1 tablet (80 mg total) by mouth daily. 100 tablet 2   Blood Glucose Monitoring Suppl (ACCU-CHEK GUIDE) w/Device KIT Check blood sugar 3 times per day. 1 kit 1   empagliflozin (JARDIANCE) 25 MG TABS tablet Take 1 tablet (25 mg total) by mouth daily before breakfast. 30 tablet 11   ezetimibe (ZETIA) 10 MG tablet Take 1 tablet (10 mg total) by mouth daily. 90 tablet 1   glucose blood (ONETOUCH VERIO) test strip Check blood sugar up to 3 times a day. 300 each 3   insulin glargine (LANTUS) 100 UNIT/ML Solostar Pen Inject 10 Units into the skin at bedtime. 15 mL 1   Insulin Pen Needle (PEN NEEDLES) 32G X 4 MM MISC 1 Needle by Does not apply route daily. 200 each 1   Lancets (ONETOUCH DELICA PLUS [id number]) MISC Check blood sugar up to 3 times a day 300 each 3   losartan-hydrochlorothiazide (HYZAAR) 100-25 MG tablet Take 1 tablet by mouth daily. 100 tablet 2   metFORMIN (GLUCOPHAGE) 1000 MG tablet Take 1 tablet (1,000 mg total) by mouth 2 (two) times daily with a meal. 200 tablet 2   No current facility-administered medications on file prior to visit.    Allergies:   Allergies  Allergen Reactions   Lisinopril Swelling      OBJECTIVE:  Physical Exam  There were no vitals filed for this visit.  There is no height or weight on file to  calculate BMI. No results found.     08/18/2022   11:42 AM  Depression screen PHQ 2/9  Decreased Interest 1  Down, Depressed, Hopeless 0  PHQ - 2 Score 1  Altered sleeping 1  Tired, decreased energy 3  Change in appetite 3  Feeling bad or failure about yourself  0  Trouble concentrating 0  Moving slowly or fidgety/restless 0  Suicidal thoughts 0  PHQ-9 Score 8  Difficult doing work/chores Not difficult at all     General: well developed, well nourished, seated, in no evident distress Head: head normocephalic and atraumatic.   Neck: supple with no carotid or supraclavicular bruits Cardiovascular: regular rate and rhythm, no murmurs Musculoskeletal: no deformity Skin:  no rash/petichiae Vascular:  Normal pulses all extremities   Neurologic Exam Mental Status: Awake and fully alert.  Fluent speech and language.  Oriented to place and time. Recent and remote memory intact. Attention span, concentration and fund of knowledge appropriate. Mood and affect appropriate.  Cranial Nerves: Fundoscopic exam reveals sharp disc margins. Pupils equal, briskly reactive to light. Extraocular movements full without nystagmus. Visual fields full to confrontation with exception of left quadrantanopia. Hearing intact. Facial sensation intact. Face, tongue, palate moves normally and symmetrically.  Motor: Normal bulk and tone. Normal strength in all tested extremity muscles Sensory.: intact to touch , pinprick , position and vibratory sensation.  Coordination: Rapid alternating movements normal in all extremities. Finger-to-nose and heel-to-shin performed accurately bilaterally. Gait and Station: Arises from chair without difficulty. Stance is normal. Gait demonstrates normal stride length and balance with no assistive device.  Reflexes: 1+ and symmetric.    NIHSS  1 Modified Rankin  0   ASSESSMENT: Cheyenne Valenzuela is a 66 y.o. year old female presenting to the ER 02/03/2022 with blurred vision,  headache and poor balance.  Vascular risk factors include HTN, HLD, DMT2 uncontrolled, advanced age, obesity.    PLAN:  Stroke:  right PCA infarct with small hemorrhagic transformation, and right thalamic and bilateral  cerebellar small infarcts, embolic pattern secondary to cardioembolic source versus posterior circulation large vessel disease with uncontrolled risk factors : Residual deficit: left sided blurred vision. Continue Eliquis and Lipitor 39m and Zetia 161mdaily for secondary stroke prevention.  Discussed secondary stroke prevention measures and importance of close PCP follow up for aggressive stroke risk factor management. I have gone over the pathophysiology of stroke, warning signs and symptoms, risk factors and their management in some detail with instructions to go to the closest emergency room for symptoms of concern. HTN: BP goal <130/90. Elevated, today 161/82. Home readings around 140/70's. Continue BP log at home. Continue losartan/HCTZ 100-2554mer PCP HLD: LDL goal <70. Recent LDL 146, down from 267 in 12/2021. Continue Lipitor 24m21md zetia 10mg73mly per PCP.   DMII: A1c goal<7.0. Recent A1c 14. Continue metformin, Jardiance and Lantus as directed by PCP. Consider GLP. Left quadrantanopia: follow up with ophthalmology as scheduled Atrial Fibrillation: continue Eliquis as directed by cardiology. Obesity: continue regular physical exercise and low carb diet. Goad BMI 25.    Follow up in 6 or call earlier if needed   CC:  [facility] pKensingtonider: Dr. SethiLeonie Man HarpeSerita Valenzuela   I spent 45 minutes of face-to-face and non-face-to-face time with patient.  This included previsit chart review including review of recent hospitalization, lab review, study review, order entry, electronic health record documentation, patient education regarding recent stroke including etiology, secondary stroke prevention measures and importance of managing stroke risk factors, residual deficits and  typical recovery time and answered all other questions to patient satisfaction   Nicci Vaughan LDebbora Presto-CMena [facility]lfDig[facility] [id number] [phone]: This document was prepared with digital dictation and possible smart phrase technology. Any transcriptional errors that result from this process are unintentional.

## 2022-09-12 NOTE — Patient Instructions (Incomplete)
Below is our plan:  Stroke:  right PCA infarct with small hemorrhagic transformation, and right thalamic and bilateral cerebellar small infarcts, embolic pattern secondary to cardioembolic source versus posterior circulation large vessel disease with uncontrolled risk factors : Residual deficit: left sided blurred vision. Continue Eliquis and Lipitor 80mg  and Zetia 10mg  daily for secondary stroke prevention.  Discussed secondary stroke prevention measures and importance of close PCP follow up for aggressive stroke risk factor management. I have gone over the pathophysiology of stroke, warning signs and symptoms, risk factors and their management in some detail with instructions to go to the closest emergency room for symptoms of concern. HTN: BP goal <130/90. Elevated, today 161/82. Home readings around 140/70's. Continue BP log at home. Continue losartan/HCTZ 100-25mg  per PCP HLD: LDL goal <70. Recent LDL 156. Continue Lipitor 80mg  and zetia 10mg  daily per PCP.  Consider Repatha. Continue working with nutrition.  DMII: A1c goal<7.0. Recent A1c 14. Continue metformin, Jardiance and Lantus as directed by PCP. Consider GLP. Continue working with nutrition.  Left quadrantanopia: follow up with ophthalmology as scheduled Atrial Fibrillation: continue Eliquis as directed by cardiology. Obesity: continue regular physical exercise and low carb diet. Goad BMI 25.    Please make sure you are staying well hydrated. I recommend 50-60 ounces daily. Well balanced diet and regular exercise encouraged. Consistent sleep schedule with 6-8 hours recommended.   Please continue follow up with care team as directed.   Follow up with neurology as needed   You may receive a survey regarding today's visit. I encourage you to leave honest feed back as I do use this information to improve patient care. Thank you for seeing me today!

## 2022-09-13 ENCOUNTER — Encounter: Payer: Self-pay | Admitting: Family Medicine

## 2022-09-13 ENCOUNTER — Ambulatory Visit: Payer: Medicare Other | Admitting: Family Medicine

## 2022-09-13 VITALS — BP 132/75 | HR 90 | Ht 63.0 in | Wt 161.0 lb

## 2022-09-13 DIAGNOSIS — E785 Hyperlipidemia, unspecified: Secondary | ICD-10-CM | POA: Diagnosis not present

## 2022-09-13 DIAGNOSIS — Z8673 Personal history of transient ischemic attack (TIA), and cerebral infarction without residual deficits: Secondary | ICD-10-CM

## 2022-09-13 DIAGNOSIS — E1169 Type 2 diabetes mellitus with other specified complication: Secondary | ICD-10-CM

## 2022-09-13 DIAGNOSIS — I4891 Unspecified atrial fibrillation: Secondary | ICD-10-CM | POA: Diagnosis not present

## 2022-09-20 ENCOUNTER — Encounter: Payer: Self-pay | Admitting: Dietician

## 2022-09-20 ENCOUNTER — Ambulatory Visit (INDEPENDENT_AMBULATORY_CARE_PROVIDER_SITE_OTHER): Payer: Medicare Other | Admitting: Internal Medicine

## 2022-09-20 ENCOUNTER — Ambulatory Visit (INDEPENDENT_AMBULATORY_CARE_PROVIDER_SITE_OTHER): Payer: Medicare Other | Admitting: Dietician

## 2022-09-20 ENCOUNTER — Encounter: Payer: Medicare Other | Admitting: Dietician

## 2022-09-20 VITALS — BP 140/71 | HR 76 | Temp 97.7°F | Ht 63.0 in | Wt 162.5 lb

## 2022-09-20 VITALS — Wt 162.7 lb

## 2022-09-20 DIAGNOSIS — E1169 Type 2 diabetes mellitus with other specified complication: Secondary | ICD-10-CM

## 2022-09-20 DIAGNOSIS — Z9189 Other specified personal risk factors, not elsewhere classified: Secondary | ICD-10-CM | POA: Diagnosis not present

## 2022-09-20 DIAGNOSIS — Z7984 Long term (current) use of oral hypoglycemic drugs: Secondary | ICD-10-CM

## 2022-09-20 DIAGNOSIS — Z794 Long term (current) use of insulin: Secondary | ICD-10-CM

## 2022-09-20 DIAGNOSIS — I10 Essential (primary) hypertension: Secondary | ICD-10-CM

## 2022-09-20 DIAGNOSIS — Z23 Encounter for immunization: Secondary | ICD-10-CM

## 2022-09-20 DIAGNOSIS — E785 Hyperlipidemia, unspecified: Secondary | ICD-10-CM

## 2022-09-20 DIAGNOSIS — Z0000 Encounter for general adult medical examination without abnormal findings: Secondary | ICD-10-CM

## 2022-09-20 NOTE — Progress Notes (Signed)
Diabetes Self-Management Education  Visit Type: Follow-up (#1)  Appt. Start Time: 1338 Appt. End Time: 1438  09/20/2022  [name], identified by name and date of birth, is a 66 y.o. female with a diagnosis of Diabetes:  Marland Kitchen Type 2  ASSESSMENT  Weight 162 lb 11.2 oz (73.8 kg). Body mass index is 28.82 kg/m. Wt Readings from Last 10 Encounters:  09/20/22 162 lb 8 oz (73.7 kg)  09/20/22 162 lb 11.2 oz (73.8 kg)  09/13/22 161 lb (73 kg)  09/06/22 163 lb 8 oz (74.2 kg)  08/18/22 159 lb 1.6 oz (72.2 kg)  08/16/22 156 lb (70.8 kg)  08/15/22 158 lb 12.8 oz (72 kg)  05/12/22 170 lb 13.7 oz (77.5 kg)  05/12/22 170 lb 14.4 oz (77.5 kg)  04/24/22 169 lb 6.4 oz (76.8 kg)   Weight stable and appropriate. Blood sugar excellent,   Assisted her in putting a dexcom G7 on today- her blood sugar was 75 in office, she reports it has been staying around 100.    Diabetes Self-Management Education - 09/20/22 1600       Visit Information   Visit Type Follow-up   #1     Health Coping   How would you rate your overall health? Good   vision still blurry     Psychosocial Assessment   Other persons present Family Member   daughter   What is the last grade level you completed in school? some college      Pre-Education Assessment   Patient understands prevention, detection, and treatment of chronic complications. Needs Review    Patient understands how to develop strategies to address psychosocial issues. Comprehends key points    Patient understands how to develop strategies to promote health/change behavior. Demonstrates understanding / competency      Complications   Fasting Blood glucose range (mg/dL) 73-710    Postprandial Blood glucose range (mg/dL) 626-948;54-627    Number of hypoglycemic episodes per month 0    Number of hyperglycemic episodes ( >200mg /dL): Rare    Can you tell when your blood sugar is high? Yes    What do you do if your blood sugar is high? need to assess     Have you had a dilated eye exam in the past 12 months? Yes    Have you had a dental exam in the past 12 months? --   need to assess   Are you checking your feet? No      Dietary Intake   Breakfast oatmeal, fruit    Lunch sometime used to skip    Dinner collards, pork chop, boiled potatoes    Snack (evening) pear    Beverage(s) mostly water, 8 oz OJ sometimes      Activity / Exercise   Activity / Exercise Type ADL's;Light (walking / raking leaves)    How many days per week do you exercise? 30    How many minutes per day do you exercise? 5    Total minutes per week of exercise 150      Patient Education   Previous Diabetes Education Yes (please comment)   here   Healthy Eating Reviewed blood glucose goals for pre and post meals and how to evaluate the patients' food intake on their blood glucose level.;Meal timing in regards to the patients' current diabetes medication.    Being Active Role of exercise on diabetes management, blood pressure control and cardiac health.    Monitoring Taught/evaluated CGM (comment)  Acute complications Taught prevention, symptoms, and  treatment of hypoglycemia - the 15 rule.;Discussed and identified patients' prevention, symptoms, and treatment of hyperglycemia.      Individualized Goals (developed by patient)   Monitoring  Consistenly use CGM      Patient Self-Evaluation of Goals - Patient rates self as meeting previously set goals (% of time)   Monitoring >75% (most of the time)      Outcomes   Expected Outcomes Demonstrated interest in learning. Expect positive outcomes    Future DMSE 4-6 wks    Program Status Not Completed      Subsequent Visit   Since your last visit have you continued or begun to take your medications as prescribed? Yes    Since your last visit have you had your blood pressure checked? No    Since your last visit have you experienced any weight changes? No change    Since your last visit, are you checking your blood glucose  at least once a day? Yes             Individualized Plan for Diabetes Self-Management Training:   Learning Objective:  Patient will have a greater understanding of diabetes self-management. Patient education plan is to attend individual and/or group sessions per assessed needs and concerns.   Plan:   There are no Patient Instructions on file for this visit.  Expected Outcomes:  Demonstrated interest in learning. Expect positive outcomes  Education material provided: Snack sheet  If problems or questions, patient to contact team via:  Phone  Future DSME appointment: 4-6 wks Norm Parcel, RD 09/20/2022 4:33 PM.

## 2022-09-20 NOTE — Assessment & Plan Note (Signed)
-   Flu shot given today - DEXA scan ordered

## 2022-09-20 NOTE — Patient Instructions (Signed)
Hi Cheyenne Valenzuela,   If you want to return to discuss the Continuous glucose monitoring results, we can consider 2-3 week follow up while it is still fresh in your mind. I was thinking of the holidays and how well you are doing when I recommended a 4 week follow up. I find around/after 4 weeks we often forget what we have talked about.  It is up to you.   Happy holidays!  Lupita Leash [phone]

## 2022-09-20 NOTE — Patient Instructions (Signed)
Thank you, Cheyenne Valenzuela for allowing [place] to provide your care today. Today we discussed:  Diabetse: Keep taking 10 units of insulin each night, metformin twice a day, and Jardiane once a day! Your blood sugars are excellent and we will recheck your A1c in 2 months See your eye doctor tomorrow  I have ordered the following labs for you:  Lab Orders  No laboratory test(s) ordered today       Referrals ordered today:   Referral Orders  No referral(s) requested today     I have ordered the following medication/changed the following medications:   Stop the following medications: There are no discontinued medications.   Start the following medications: No orders of the defined types were placed in this encounter.    Follow up: 2 months    Should you have any questions or concerns please call the internal medicine clinic at [phone].     Elza Rafter, D.O. [facility]

## 2022-09-20 NOTE — Assessment & Plan Note (Signed)
Blood pressure initially elevated to 147/78 and improved to 140/71 upon recheck.  She is on Hyzaar 100-25 mg daily and amlodipine 10 mg daily and will not make any changes at this time.

## 2022-09-20 NOTE — Progress Notes (Signed)
CC: diabetes/vision changes  HPI:  [name] is a 66 y.o. female living with a history stated below and presents today for follow-up of diabetes. Please see problem based assessment and plan for additional details.  Past Medical History:  Diagnosis Date   Cough 06/24/2021   Hypercholesteremia    Hypertension    Influenza A 11/21/2021   Stroke [facility])     Current Outpatient Medications on File Prior to Visit  Medication Sig Dispense Refill   amLODipine (NORVASC) 10 MG tablet Take 1 tablet (10 mg total) by mouth daily. 30 tablet 11   apixaban (ELIQUIS) 5 MG TABS tablet Take 1 tablet (5 mg total) by mouth 2 (two) times daily. 60 tablet 11   atorvastatin (LIPITOR) 80 MG tablet Take 1 tablet (80 mg total) by mouth daily. 100 tablet 2   Blood Glucose Monitoring Suppl (ACCU-CHEK GUIDE) w/Device KIT Check blood sugar 3 times per day. 1 kit 1   empagliflozin (JARDIANCE) 25 MG TABS tablet Take 1 tablet (25 mg total) by mouth daily before breakfast. 30 tablet 11   ezetimibe (ZETIA) 10 MG tablet Take 1 tablet (10 mg total) by mouth daily. 90 tablet 1   glucose blood (ONETOUCH VERIO) test strip Check blood sugar up to 3 times a day. 300 each 3   insulin glargine (LANTUS) 100 UNIT/ML Solostar Pen Inject 10 Units into the skin at bedtime. 15 mL 1   Insulin Pen Needle (PEN NEEDLES) 32G X 4 MM MISC 1 Needle by Does not apply route daily. 200 each 1   Lancets (ONETOUCH DELICA PLUS [id number]) MISC Check blood sugar up to 3 times a day 300 each 3   losartan-hydrochlorothiazide (HYZAAR) 100-25 MG tablet Take 1 tablet by mouth daily. 100 tablet 2   metFORMIN (GLUCOPHAGE) 1000 MG tablet Take 1 tablet (1,000 mg total) by mouth 2 (two) times daily with a meal. 200 tablet 2   No current facility-administered medications on file prior to visit.    Family History  Problem Relation Age of Onset   Diabetes Mother    Diabetes Sister     Social History   Socioeconomic History   Marital status:  Single    Spouse name: Not on file   Number of children: 3   Years of education: Not on file   Highest education level: Some college, no degree  Occupational History   Occupation: Retired  Tobacco Use   Smoking status: Never    Passive exposure: Never   Smokeless tobacco: Never  Substance and Sexual Activity   Alcohol use: No   Drug use: Never   Sexual activity: Not on file  Other Topics Concern   Not on file  Social History Narrative   Lives w daughter   Left handed   Caffeine: rarely, mainly juice      _____________________________________________         Breakfast - oatmeal with sugar, fruit, coffee or tea   Lunch - usually no lunch   Dinner - chicken or fish or [place], vegetables with broccoli and cheese, green beans, tomatoes, corn, butter, sometimes rice   Snacks - fruit   Drinks - water or tea, juice   Exercise - walking most days, 2.5 miles      Social Determinants of Health   Financial Resource Strain: [place]  (05/12/2022)   Overall Financial Resource Strain (CARDIA)    Difficulty of Paying Living Expenses: Not very hard  Food Insecurity: No Food Insecurity (08/15/2022)  Hunger Vital Sign    Worried About Running Out of Food in the Last Year: Never true    Ran Out of Food in the Last Year: Never true  Transportation Needs: No Transportation Needs (08/15/2022)   PRAPARE - Hydrologist (Medical): No    Lack of Transportation (Non-Medical): No  Physical Activity: Inactive (05/12/2022)   Exercise Vital Sign    Days of Exercise per Week: 0 days    Minutes of Exercise per Session: 0 min  Stress: No Stress Concern Present (05/12/2022)   [place]    Feeling of Stress : Not at all  Social Connections: Moderately Integrated (05/12/2022)   Social Connection and Isolation Panel [NHANES]    Frequency of Communication with Friends and Family: Three times a week    Frequency of  Social Gatherings with Friends and Family: Once a week    Attends Religious Services: More than 4 times per year    Active Member of [employer] or Organizations: Yes    Attends Archivist Meetings: Never    Marital Status: Widowed  Intimate Partner Violence: Not At Risk (05/12/2022)   Humiliation, Afraid, Rape, and Kick questionnaire    Fear of Current or Ex-Partner: No    Emotionally Abused: No    Physically Abused: No    Sexually Abused: No    Review of Systems: ROS negative except for what is noted on the assessment and plan.  Vitals:   09/20/22 1453 09/20/22 1538  BP: (!) 147/78 (!) 140/71  Pulse: 79 76  Temp: 97.7 F (36.5 C)   TempSrc: Oral   SpO2: 100%   Weight: 162 lb 8 oz (73.7 kg)   Height: 5' 3" (1.6 m)     Physical Exam: Constitutional: well-appearing female sitting in chair, in no acute distress Cardiovascular: regular rate and rhythm, no m/r/g Pulmonary/Chest: normal work of breathing on room air MSK: normal bulk and tone Neurological: alert & oriented x 3, no focal deficit, PERRL and EOMI Skin: warm and dry Psych: normal mood and behavior  Assessment & Plan:    Patient discussed with Dr. Jimmye Norman  Type 2 diabetes mellitus with hyperlipidemia ([place]) A1c in November was noted to be >14%.  The patient is on Lantus 10 units nightly, metformin 1 g twice daily, and Jardiance 25 mg daily.  She has been checking her blood sugars at home, and most of her fasting sugars have been in range.  She did have 1 episode where her blood sugar was in the 80s, and she felt shaky, thus she had to have a little snack.  Today, she met with Butch Penny prior to our appointment, and she had a CGM placed.   At the last visit, the patient was endorsing blurry vision.  The patient states that she continues to have this blurry vision, but it is intermittent and is slightly less severe than it was previously.  The patient is unable to drive and is having her daughter drive her to her  appointments.  An MRI was obtained after the last visit, there was no acute infarction or hemorrhage.  The patient has follow-up with her ophthalmologist tomorrow to discuss this in further detail.  Plan: -Continue metformin 1 g twice daily -Continue Jardiance 25 mg daily -Continue Lantus 10 units nightly -Consider GLP-1 agonist when blurry vision improved -Follow-up with ophthalmology  Hypertension Blood pressure initially elevated to 147/78 and improved to 140/71 upon recheck.  She is on Hyzaar 100-25 mg daily and amlodipine 10 mg daily and will not make any changes at this time.  Healthcare maintenance - Flu shot given today - DEXA scan ordered   Rayann Atway, D.O. [facility], PGY-2 Phone: [phone] Date 09/20/2022 Time 4:18 PM

## 2022-09-20 NOTE — Assessment & Plan Note (Addendum)
A1c in November was noted to be >14%.  The patient is on Lantus 10 units nightly, metformin 1 g twice daily, and Jardiance 25 mg daily.  She has been checking her blood sugars at home, and most of her fasting sugars have been in range.  She did have 1 episode where her blood sugar was in the 80s, and she felt shaky, thus she had to have a little snack.  Today, she met with Butch Penny prior to our appointment, and she had a CGM placed.   At the last visit, the patient was endorsing blurry vision.  The patient states that she continues to have this blurry vision, but it is intermittent and is slightly less severe than it was previously.  The patient is unable to drive and is having her daughter drive her to her appointments.  An MRI was obtained after the last visit, there was no acute infarction or hemorrhage.  The patient has follow-up with her ophthalmologist tomorrow to discuss this in further detail.  Plan: -Continue metformin 1 g twice daily -Continue Jardiance 25 mg daily -Continue Lantus 10 units nightly -Consider GLP-1 agonist when blurry vision improved -Follow-up with ophthalmology

## 2022-09-28 ENCOUNTER — Inpatient Hospital Stay: Admission: RE | Admit: 2022-09-28 | Payer: Medicare Other | Source: Ambulatory Visit

## 2022-10-04 ENCOUNTER — Other Ambulatory Visit: Payer: Self-pay

## 2022-10-04 NOTE — Progress Notes (Signed)
Internal Medicine Clinic Attending ° °Case discussed with Dr. Atway  At the time of the visit.  We reviewed the resident’s history and exam and pertinent patient test results.  I agree with the assessment, diagnosis, and plan of care documented in the resident’s note.  °

## 2022-10-17 ENCOUNTER — Ambulatory Visit (INDEPENDENT_AMBULATORY_CARE_PROVIDER_SITE_OTHER): Payer: Medicare PPO | Admitting: Dietician

## 2022-10-17 ENCOUNTER — Encounter: Payer: Self-pay | Admitting: Dietician

## 2022-10-17 ENCOUNTER — Telehealth: Payer: Self-pay | Admitting: Dietician

## 2022-10-17 VITALS — Wt 166.1 lb

## 2022-10-17 DIAGNOSIS — E785 Hyperlipidemia, unspecified: Secondary | ICD-10-CM

## 2022-10-17 DIAGNOSIS — Z9641 Presence of insulin pump (external) (internal): Secondary | ICD-10-CM

## 2022-10-17 DIAGNOSIS — E1169 Type 2 diabetes mellitus with other specified complication: Secondary | ICD-10-CM | POA: Diagnosis not present

## 2022-10-17 NOTE — Patient Instructions (Addendum)
I will ask for a prescription for the Dexcom G7.  You are doing great taking care of your diabetes.   We talked about getting enough protein during your fast. Please monitoring your blood sugar and pressure during fasting and call as discussed- blood sugar 70-99 and ask doctor about blood pressure.    I recommend a follow up in 2-4 months.   Butch Penny [phone]

## 2022-10-17 NOTE — Telephone Encounter (Signed)
Patient requests prescription for Dexcom G7 sensors and receiver. Will contact patient about mail order. Rec [facility].

## 2022-10-17 NOTE — Progress Notes (Signed)
Diabetes Self-Management Education  Visit Type:  Follow-up (#2)  Appt. Start Time: 1530 Appt. End Time: 1625  10/17/2022  [name], identified by name and date of birth, is a 67 y.o. female with a diagnosis of Diabetes:  Type 2 ASSESSMENT  Weight 166 lb 1.6 oz (75.3 kg). Body mass index is 29.42 kg/m. Wt Readings from Last 10 Encounters:  10/17/22 166 lb 1.6 oz (75.3 kg)  09/20/22 162 lb 8 oz (73.7 kg)  09/20/22 162 lb 11.2 oz (73.8 kg)  09/13/22 161 lb (73 kg)  09/06/22 163 lb 8 oz (74.2 kg)  08/18/22 159 lb 1.6 oz (72.2 kg)  08/16/22 156 lb (70.8 kg)  08/15/22 158 lb 12.8 oz (72 kg)  05/12/22 170 lb 13.7 oz (77.5 kg)  05/12/22 170 lb 14.4 oz (77.5 kg)   Lab Results  Component Value Date   HGBA1C >14.0 (A) 08/18/2022   HGBA1C 9.0 (A) 05/12/2022   HGBA1C 7.8 (H) 02/04/2022   HGBA1C 6.5 (A) 12/12/2021   HGBA1C 6.5 (A) 08/29/2021    BP Readings from Last 3 Encounters:  09/20/22 (!) 140/71  09/13/22 132/75  09/05/22 (!) 144/75   Lipid Panel     Component Value Date/Time   CHOL 243 (H) 05/12/2022 1029   TRIG 280 (H) 05/12/2022 1029   HDL 35 (L) 05/12/2022 1029   CHOLHDL 6.9 (H) 05/12/2022 1029   CHOLHDL 6.8 02/04/2022 0334   VLDL 47 (H) 02/04/2022 0334   LDLCALC 156 (H) 05/12/2022 1029   LABVLDL 52 (H) 05/12/2022 1029       Diabetes Self-Management Education - 10/17/22 1600       Health Coping   How would you rate your overall health? Good      Complications   How often do you check your blood sugar? 1-2 times/day    Fasting Blood glucose range (mg/dL) 70-129    Postprandial Blood glucose range (mg/dL) 70-129;130-179    Number of hypoglycemic episodes per month 0    Number of hyperglycemic episodes ( >200mg /dL): Never    What do you do if your blood sugar is high? eat right, drink water, take meds    Have you had a dental exam in the past 12 months? Yes    Are you checking your feet? No      Dietary Intake   Breakfast glucerna or breakfast bowl-  egg,bacon, potato or 8 oz vegetable smoothie, water    Snack (morning) peanuts, water    Lunch 1/2- 1 cup spaghettti, water    Snack (afternoon) yogurt, water    Dinner vegtazble soup, 6 crackers. water    Snack (evening) orange, water    Beverage(s) water      Activity / Exercise   Activity / Exercise Type ADL's;Light (walking / raking leaves)   used to walk 1-2 hours 3 days a week, plans to get back to that after catacact surgery     Patient Education   Previous Diabetes Education Yes (please comment)   here   Disease Pathophysiology Explored patient's options for treatment of their diabetes    Healthy Eating Role of diet in the treatment of diabetes and the relationship between the three main macronutrients and blood glucose level    Medications Reviewed patients medication for diabetes, action, purpose, timing of dose and side effects.;Other (comment)   reviewed medication adjustments for fasting   Chronic complications Applicable immunizations;Reviewed with patient heart disease, higher risk of, and prevention;Nephropathy, what it is, prevention of, the  use of ACE, ARB's and early detection of through urine microalbumia.;Retinopathy and reason for yearly dilated eye exams;Dental care;Assessed and discussed foot care and prevention of foot problems;Relationship between chronic complications and blood glucose control    Preconception care --   not applicable     Patient Self-Evaluation of Goals - Patient rates self as meeting previously set goals (% of time)   Monitoring >75% (most of the time)      Post-Education Assessment   Patient understands incorporating nutritional management into lifestyle. Comprehends key points    Patient undertands incorporating physical activity into lifestyle. Comprehends key points    Patient understands using medications safely. Comphrehends key points    Patient understands monitoring blood glucose, interpreting and using results Comprehends key points     Patient understands prevention, detection, and treatment of acute complications. Comprehends key points    Patient understands prevention, detection, and treatment of chronic complications. Comprehends key points      Outcomes   Program Status Not Completed   need to assess understnading of diabetes pathophysiology     Subsequent Visit   Since your last visit have you continued or begun to take your medications as prescribed? Yes    Since your last visit have you had your blood pressure checked? Yes    Is your most recent blood pressure lower, unchanged, or higher since your last visit? Higher    Since your last visit have you experienced any weight changes? Gain    Weight Gain (lbs) 4    Since your last visit, are you checking your blood glucose at least once a day? Yes             Learning Objective:  Patient will have a greater understanding of diabetes self-management. Patient education plan is to attend individual and/or group sessions per assessed needs and concerns.   Plan:   Patient Instructions  I will ask for a prescription for the Dexcom G7.  You are doing great taking care of your diabetes.   We talked about getting enough protein during your fast. Please monitoring your blood sugar and pressure during fasting and call as discussed- blood sugar 70-99 and ask doctor about blood pressure.    I recommend a follow up in 2-4 months.   Lupita Leash [phone]   Expected Outcomes:  Demonstrated interest in learning. Expect positive outcomes Education material provided: Diabetes Resources If problems or questions, patient to contact team via:  Phone Future DSME appointment: - 2 months Norm Parcel, RD 10/17/2022 4:37 PM.

## 2022-10-18 NOTE — Telephone Encounter (Signed)
Signed order for Continuous glucose monitoring, clinical notes and demographics faxed to [facility].

## 2022-10-25 ENCOUNTER — Ambulatory Visit (INDEPENDENT_AMBULATORY_CARE_PROVIDER_SITE_OTHER): Payer: Medicare PPO | Admitting: Student

## 2022-10-25 DIAGNOSIS — H269 Unspecified cataract: Secondary | ICD-10-CM

## 2022-10-26 ENCOUNTER — Encounter: Payer: Self-pay | Admitting: Student

## 2022-10-26 DIAGNOSIS — H269 Unspecified cataract: Secondary | ICD-10-CM | POA: Insufficient documentation

## 2022-10-26 NOTE — Assessment & Plan Note (Addendum)
Patient reported having blurry vision back in November 2023. At the time, she was evaluated at the ED for severely elevated blood sugar with A1c >14%. An MRI brain was performed and it was negative for an acute intracranial abnormality. Her blurry vision was evaluated by her ophthalmologist and thought to be due to worsening of her bilateral cataracts in the setting of her uncontrolled diabetes. Patient is unable to drive due to her vision deficits so her daughter, Margaree Mackintosh, drives her to all medical appointments. Patient has multiple appointments in January and February including scheduled cataract surgeries so her daughter needs to take time off to ensure patient is able to make her appointments and get the appropriate medical care. During this telephone encounter, I gathered the necessary information from patient to fill out an FMLA for her daughter. Patient states she is able to perform most of her ADLs at home but needs her daughter to drive her to her appointments.  -FMLA form completed for patient's daughter to take time off as needed to drive patient to her medical appointments until she has completed management of her blurry vision.  -1st cataract surgery scheduled for January 29th, 2nd on February 12th

## 2022-10-26 NOTE — Progress Notes (Signed)
   CC: To complete FMLA form  This is a telephone encounter between Cheyenne Valenzuela and Cheyenne Valenzuela on 10/26/2022 for completion of FMLA form. The visit was conducted with the patient located at home and Cheyenne Valenzuela at [facility]. The patient's identity was confirmed using their DOB and current address. The patient has consented to being evaluated through a telephone encounter and understands the associated risks (an examination cannot be done and the patient may need to come in for an appointment) / benefits (allows the patient to remain at home, decreasing exposure to coronavirus). I personally spent 15 minutes on medical discussion.   HPI:  [name] is a 67 y.o. with PMH as below.   Please see A&P for assessment of the patient's acute and chronic medical conditions.   Past Medical History:  Diagnosis Date   Cough 06/24/2021   Hypercholesteremia    Hypertension    Influenza A 11/21/2021   Stroke ([place])    Review of Systems:  Positive for blurry vision    Assessment & Plan:   See Encounters Tab for problem based charting.  Patient discussed with Dr. Lockie Pares, MD, MPH

## 2022-10-27 NOTE — Progress Notes (Signed)
Internal Medicine Clinic Attending  Case discussed with Dr. Coy Saunas  At the time of the visit.  We reviewed the resident's history and pertinent patient test results.  I agree with the assessment, diagnosis, and plan of care documented in the resident's note.

## 2022-11-02 NOTE — Telephone Encounter (Signed)
Per [facility] patient to get delivery of Dexcom G7 sensors and receiver on 11/03/22.

## 2022-11-14 DIAGNOSIS — H25811 Combined forms of age-related cataract, right eye: Secondary | ICD-10-CM | POA: Diagnosis not present

## 2022-11-14 DIAGNOSIS — Z8673 Personal history of transient ischemic attack (TIA), and cerebral infarction without residual deficits: Secondary | ICD-10-CM | POA: Diagnosis not present

## 2022-11-14 DIAGNOSIS — E1165 Type 2 diabetes mellitus with hyperglycemia: Secondary | ICD-10-CM | POA: Diagnosis not present

## 2022-11-14 DIAGNOSIS — Z794 Long term (current) use of insulin: Secondary | ICD-10-CM | POA: Diagnosis not present

## 2022-11-14 DIAGNOSIS — H53462 Homonymous bilateral field defects, left side: Secondary | ICD-10-CM | POA: Diagnosis not present

## 2022-11-15 ENCOUNTER — Encounter: Payer: Medicare Other | Admitting: Student

## 2022-11-16 ENCOUNTER — Encounter (HOSPITAL_COMMUNITY): Payer: Self-pay | Admitting: *Deleted

## 2022-11-20 DIAGNOSIS — H2511 Age-related nuclear cataract, right eye: Secondary | ICD-10-CM | POA: Diagnosis not present

## 2022-11-20 DIAGNOSIS — E1136 Type 2 diabetes mellitus with diabetic cataract: Secondary | ICD-10-CM | POA: Diagnosis not present

## 2022-11-27 ENCOUNTER — Encounter: Payer: Self-pay | Admitting: Student

## 2022-11-27 ENCOUNTER — Ambulatory Visit (INDEPENDENT_AMBULATORY_CARE_PROVIDER_SITE_OTHER): Payer: Medicare (Managed Care) | Admitting: Dietician

## 2022-11-27 ENCOUNTER — Other Ambulatory Visit: Payer: Self-pay

## 2022-11-27 ENCOUNTER — Ambulatory Visit (INDEPENDENT_AMBULATORY_CARE_PROVIDER_SITE_OTHER): Payer: Medicare (Managed Care) | Admitting: Student

## 2022-11-27 VITALS — BP 150/78 | HR 71 | Temp 97.5°F | Ht 63.0 in | Wt 168.4 lb

## 2022-11-27 DIAGNOSIS — Z8673 Personal history of transient ischemic attack (TIA), and cerebral infarction without residual deficits: Secondary | ICD-10-CM

## 2022-11-27 DIAGNOSIS — I1A0 Resistant hypertension: Secondary | ICD-10-CM

## 2022-11-27 DIAGNOSIS — Z7984 Long term (current) use of oral hypoglycemic drugs: Secondary | ICD-10-CM

## 2022-11-27 DIAGNOSIS — Z794 Long term (current) use of insulin: Secondary | ICD-10-CM

## 2022-11-27 DIAGNOSIS — E1169 Type 2 diabetes mellitus with other specified complication: Secondary | ICD-10-CM | POA: Diagnosis not present

## 2022-11-27 DIAGNOSIS — M79601 Pain in right arm: Secondary | ICD-10-CM | POA: Insufficient documentation

## 2022-11-27 DIAGNOSIS — I10 Essential (primary) hypertension: Secondary | ICD-10-CM

## 2022-11-27 DIAGNOSIS — Z0000 Encounter for general adult medical examination without abnormal findings: Secondary | ICD-10-CM

## 2022-11-27 DIAGNOSIS — E785 Hyperlipidemia, unspecified: Secondary | ICD-10-CM

## 2022-11-27 LAB — POCT GLYCOSYLATED HEMOGLOBIN (HGB A1C): Hemoglobin A1C: 7 % — AB (ref 4.0–5.6)

## 2022-11-27 LAB — GLUCOSE, CAPILLARY: Glucose-Capillary: 83 mg/dL (ref 70–99)

## 2022-11-27 MED ORDER — LOSARTAN POTASSIUM-HCTZ 100-25 MG PO TABS: 1.0000 | ORAL_TABLET | Freq: Every day | ORAL | 2 refills | Status: DC

## 2022-11-27 MED ORDER — DEXCOM G7 SENSOR MISC: 1.0000 | 3 refills | Status: DC

## 2022-11-27 MED ORDER — TRULICITY 0.75 MG/0.5ML ~~LOC~~ SOAJ: 0.7500 mg | SUBCUTANEOUS | 2 refills | Status: DC

## 2022-11-27 MED ORDER — INSULIN GLARGINE 100 UNIT/ML SOLOSTAR PEN: 10.0000 [IU] | PEN_INJECTOR | Freq: Every day | SUBCUTANEOUS | 1 refills | Status: DC

## 2022-11-27 MED ORDER — AMLODIPINE BESYLATE 10 MG PO TABS: 10.0000 mg | ORAL_TABLET | Freq: Every day | ORAL | 0 refills | Status: DC

## 2022-11-27 NOTE — Progress Notes (Signed)
Diabetes Self-Management Education  Visit Type:  Follow-up (#3)  Appt. Start Time: 1500 Appt. End Time: [id number]  11/28/2022  [name], identified by name and date of birth, is a 67 y.o. female with a diagnosis of Diabetes:  .   ASSESSMENT  Estimated body mass index is 29.83 kg/m as calculated from the following:   Height as of an earlier encounter on 11/27/22: 5' 3"$  (1.6 m).   Weight as of an earlier encounter on 11/27/22: 168 lb 6.4 oz (76.4 kg). Wt Readings from Last 10 Encounters:  11/27/22 168 lb 6.4 oz (76.4 kg)  10/17/22 166 lb 1.6 oz (75.3 kg)  09/20/22 162 lb 8 oz (73.7 kg)  09/20/22 162 lb 11.2 oz (73.8 kg)  09/13/22 161 lb (73 kg)  09/06/22 163 lb 8 oz (74.2 kg)  08/18/22 159 lb 1.6 oz (72.2 kg)  08/16/22 156 lb (70.8 kg)  08/15/22 158 lb 12.8 oz (72 kg)  05/12/22 170 lb 13.7 oz (77.5 kg)   Lab Results  Component Value Date   HGBA1C 7.0 (A) 11/27/2022   HGBA1C >14.0 (A) 08/18/2022   HGBA1C 9.0 (A) 05/12/2022   HGBA1C 7.8 (H) 02/04/2022   HGBA1C 6.5 (A) 12/12/2021    Blood sugars and A1c much improved. Weight increased. Patient would like to get off her diabetes medicine however she does not want to decrease her weight. We discussed these goals may contradict each other. She is starting on Trulicity today (which she was educated about how to use the pen and the action, benefits and risks of this medicine are) she is to remain on the lantus insulin . She was educated about hypoglycemia signs and symptoms and when to call the office.     Diabetes Self-Management Education - 11/28/22 0900       Health Coping   How would you rate your overall health? Excellent      Complications   Last HgB A1C per patient/outside source 7 %    How often do you check your blood sugar? > 4 times/day    Fasting Blood glucose range (mg/dL) 70-129    Postprandial Blood glucose range (mg/dL) 130-179;180-200;>200    Have you had a dilated eye exam in the past 12 months? Yes   just  had cataract surgery     Patient Education   Previous Diabetes Education Yes (please comment)   here   Disease Pathophysiology --   need to assess more at next visit   Monitoring Identified appropriate SMBG and/or A1C goals.;Taught/evaluated CGM (comment)   lowered target to 70-140 per patient desire to reduce diabetes medicine     Patient Self-Evaluation of Goals - Patient rates self as meeting previously set goals (% of time)   Monitoring >75% (most of the time)      Outcomes   Program Status Not Completed      Subsequent Visit   Since your last visit have you continued or begun to take your medications as prescribed? Yes    Since your last visit have you had your blood pressure checked? Yes    Is your most recent blood pressure lower, unchanged, or higher since your last visit? Higher    Since your last visit have you experienced any weight changes? Gain    Weight Gain (lbs) 2    Since your last visit, are you checking your blood glucose at least once a day? Yes   using CGM now almost continuously  Learning Objective:  Patient will have a greater understanding of diabetes self-management. Patient education plan is to attend individual and/or group sessions per assessed needs and concerns.   Plan:   There are no Patient Instructions on file for this visit.   Expected Outcomes:  Demonstrated interest in learning. Expect positive outcomes  Education material provided: Diabetes Resources  If problems or questions, patient to contact team via:  Phone  Future DSME appointment: - 4-6 wks [name], RD 11/28/2022 9:26 AM.

## 2022-11-27 NOTE — Assessment & Plan Note (Signed)
Blood pressure elevated at 151/77 despite being on maximum dose of 3 different agents.  Patient report adherence to medications.  She will need workup for other secondary causes.  -Stop bang score of 4-5, indicating high risk for sleep apnea.  Home sleep study ordered -Obtain aldosterone/renin ratio -Continue amlodipine, losartan HCTZ.  Will not adjust current regimen right now.  Follow-up after these labs come back.

## 2022-11-27 NOTE — Assessment & Plan Note (Signed)
A1c improved significantly from 14 to 7 Current regimen: Metformin 1 g twice daily Jardiance 25 mg daily Lantus 10 units nightly  Glucometer show average CBG 147.  -Continue current regimen -Add Trulicity [id number] mg weekly titrate up if tolerated.  No reported history of pancreatitis or family history of thyroid cancer.  Hopefully we can get her off of insulin

## 2022-11-27 NOTE — Patient Instructions (Signed)
Cheyenne Valenzuela,  It was nice seeing you in the clinic today. Here is summary of what we talked about:  1.  Diabetes is better controlled now.  Congratulations.  I added Trulicity 0.5 mg weekly to your regimen.  We will slowly titrate down your insulin as we increase the dose of the Trulicity.  2.  Your blood pressure remains uncontrolled on 3 medications.  I will check some blood work today as well as a home sleep study to rule out sleep apnea.  3.  I will get a x-ray of your neck to rule out any nerve impingement.  4.  Please go to your pharmacy to get a Tdap or shingles vaccine  Return in 1 month  Dr. Alfonse Spruce

## 2022-11-27 NOTE — Progress Notes (Signed)
CC: Follow-up for diabetes  HPI:  [name] is a 67 y.o. living with type 2 diabetes, hypertension, history of CVA, who presents to the clinic for diabetes follow-up and evaluation of right arm pain.  Please see problem based charting for detail  Past Medical History:  Diagnosis Date   Cough 06/24/2021   Hypercholesteremia    Hypertension    Influenza A 11/21/2021   Stroke [facility])    Review of Systems:  per HPI  Physical Exam:  Vitals:   11/27/22 1504 11/27/22 1611  BP: (!) 151/77 (!) 150/78  Pulse: 78 71  Temp: (!) 97.5 F (36.4 C)   TempSrc: Oral   SpO2: 92%   Weight: 168 lb 6.4 oz (76.4 kg)   Height: 5' 3"$  (1.6 m)    Physical Exam Constitutional:      General: She is not in acute distress.    Appearance: She is not ill-appearing.  Eyes:     General:        Right eye: No discharge.        Left eye: No discharge.     Conjunctiva/sclera: Conjunctivae normal.  Cardiovascular:     Rate and Rhythm: Normal rate and regular rhythm.  Pulmonary:     Effort: Pulmonary effort is normal. No respiratory distress.     Breath sounds: Normal breath sounds. No wheezing.  Abdominal:     General: Bowel sounds are normal. There is no distension.     Palpations: Abdomen is soft.     Tenderness: There is no abdominal tenderness.  Musculoskeletal:     Comments: Mild tenderness to palpation of the posterior right arm.  No numbness of the right thumb.  No cervical tenderness to palpation.  Negative Spurling test bilaterally.  No pain right shoulder joint to palpation. 5/5 strength bilateral upper extremities.   Skin:    General: Skin is warm.  Neurological:     Mental Status: She is alert.  Psychiatric:        Mood and Affect: Mood normal.      Assessment & Plan:   See Encounters Tab for problem based charting.  Resistant hypertension Blood pressure elevated at 151/77 despite being on maximum dose of 3 different agents.  Patient report adherence to medications.  She  will need workup for other secondary causes.  -Stop bang score of 4-5, indicating high risk for sleep apnea.  Home sleep study ordered -Obtain aldosterone/renin ratio -Continue amlodipine, losartan HCTZ.  Will not adjust current regimen right now.  Follow-up after these labs come back.  Type 2 diabetes mellitus with hyperlipidemia (HCC) A1c improved significantly from 14 to 7 Current regimen: Metformin 1 g twice daily Jardiance 25 mg daily Lantus 10 units nightly  Glucometer show average CBG 147.  -Continue current regimen -Add Trulicity [id number] mg weekly titrate up if tolerated.  No reported history of pancreatitis or family history of thyroid cancer.  Hopefully we can get her off of insulin  Right arm pain Patient report pain of the right arm that started 3 weeks ago.  Pain started in the medial border of the scapula and radiates to the posterior arms and down her thumb.  Report numbness in her right thumb.  Pain is worse with exertion especially picking up things on the floor.  Pain is improved with outstretched arm.  She has been taking ibuprofen intermittently for pain.  Reports pain 5/10.  Described pain as aching.  She denies any rash or history of shingles.  Physical exam mild tenderness to palpation of the posterior right arm.  No numbness of the right thumb.  No cervical tenderness to palpation.  Negative Spurling test bilaterally. 5/5 strength bilateral upper extremities.  No rash or skin changes noted on the affected part.  The distribution of her pain correlate with C6 dermatome.  Spurling test is negative but cannot rule out cervical radiculopathy.  Will start with cervical x-ray.  In the meantime she can be ibuprofen as needed for pain.  Healthcare maintenance Advised patient to go to pharmacy for Tdap and shingles vaccine   Patient discussed with Dr. Heber Waltham

## 2022-11-27 NOTE — Assessment & Plan Note (Signed)
Advised patient to go to pharmacy for Tdap and shingles vaccine

## 2022-11-27 NOTE — Assessment & Plan Note (Signed)
Patient report pain of the right arm that started 3 weeks ago.  Pain started in the medial border of the scapula and radiates to the posterior arms and down her thumb.  Report numbness in her right thumb.  Pain is worse with exertion especially picking up things on the floor.  Pain is improved with outstretched arm.  She has been taking ibuprofen intermittently for pain.  Reports pain 5/10.  Described pain as aching.  She denies any rash or history of shingles.  Physical exam mild tenderness to palpation of the posterior right arm.  No numbness of the right thumb.  No cervical tenderness to palpation.  Negative Spurling test bilaterally. 5/5 strength bilateral upper extremities.  No rash or skin changes noted on the affected part.  The distribution of her pain correlate with C6 dermatome.  Spurling test is negative but cannot rule out cervical radiculopathy.  Will start with cervical x-ray.  In the meantime she can be ibuprofen as needed for pain.

## 2022-11-28 ENCOUNTER — Encounter: Payer: Self-pay | Admitting: Dietician

## 2022-11-29 ENCOUNTER — Ambulatory Visit (HOSPITAL_BASED_OUTPATIENT_CLINIC_OR_DEPARTMENT_OTHER)
Admission: RE | Admit: 2022-11-29 | Discharge: 2022-11-29 | Disposition: A | Discharge status: Home / Self Care | Payer: Medicare (Managed Care) | Source: Ambulatory Visit | Attending: Internal Medicine | Admitting: Internal Medicine

## 2022-11-29 DIAGNOSIS — R200 Anesthesia of skin: Secondary | ICD-10-CM | POA: Diagnosis not present

## 2022-11-29 DIAGNOSIS — M79601 Pain in right arm: Secondary | ICD-10-CM | POA: Diagnosis not present

## 2022-11-29 NOTE — Progress Notes (Signed)
Internal Medicine Clinic Attending  Case discussed with the resident at the time of the visit.  We reviewed the resident's history and exam and pertinent patient test results.  I agree with the assessment, diagnosis, and plan of care documented in the resident's note.  

## 2022-12-01 MED ORDER — INSULIN DEGLUDEC 100 UNIT/ML ~~LOC~~ SOPN: 10.0000 [IU] | PEN_INJECTOR | Freq: Every day | SUBCUTANEOUS | 2 refills | Status: DC

## 2022-12-01 NOTE — Addendum Note (Signed)
Addended byGaylan Gerold on: 12/01/2022 02:51 PM   Modules accepted: Orders

## 2022-12-03 LAB — ALDOSTERONE + RENIN ACTIVITY W/ RATIO
Aldos/Renin Ratio: 11.5 (ref 0.0–30.0)
Aldosterone: 7.1 ng/dL (ref 0.0–30.0)
Renin Activity, Plasma: 0.615 ng/mL/hr (ref 0.167–5.380)

## 2022-12-13 NOTE — Progress Notes (Signed)
CC: Scheduled Follow-up  HPI:   [name] is a 67 y.o. female with a past medical history of hypertension, diabetes II, and CVA who presents for scheduled follow-up. She was last seen at [facility] on 2-19.    Past Medical History:  Diagnosis Date   Cough 06/24/2021   Hypercholesteremia    Hypertension    Influenza A 11/21/2021   Stroke [facility])      Review of Systems:    Reports feeling good overall Denies fever, lightheadedness, dizziness, headache, anxiety, chest pain    Physical Exam:  Vitals:   12/19/22 1501 12/19/22 1507  BP: (!) 184/95 (!) 188/95  Pulse: 73 67  Resp: (!) 28   Temp: 98.2 F (36.8 C)   TempSrc: Oral   SpO2: 100%   Weight: 167 lb 12.8 oz (76.1 kg)   Height: 5\' 3"  (1.6 m)     General:   awake and alert, sitting comfortably in chair, cooperative, not in acute distress Skin:   warm and dry, intact without any obvious lesions or scars, no rashes Eyes:   extraocular movements intact, pupils round Lungs:   normal respiratory effort, breathing unlabored, symmetrical chest rise, no crackles or wheezing Cardiac:   regular rate and rhythm, normal S1 and S2 Neurologic:   oriented to person-place-time, moving all extremities, no gross focal deficits Psychiatric:   euthymic mood with congruent affect, intelligible speech    Assessment & Plan:   Resistant hypertension Patient has history of hypertension managed with amlodipine, losartan, and hydrochlorothiazide. She reports daily adherence to these medications including this morning. Of note, adherence was inconsistent during early 2023 but has since improved, especially over the last several months. Denies lightheadedness, dizziness, blurry vision, headache, anxiety, chest pain, and palpitations. Home BP values typically in the 150-160s/70s and today in clinic was 188/95. At her last visit, sleep study was ordered but never completed. Sleep apnea is possible etiology of persistent hypertension despite  multiple medications. Further workup may include diagnostic studies for hypercortisolism. Given persistent hypertension, patient will benefit from another antihypertensive medication. Renal function and electrolyte evaluation currently pending with BMP ordered today, which will help determine next agent.  - Continue amlodipine 10mg  q24 and losartan-hydrochlorothiazide 100-25mg  q24 - Confirm validity of home sleep study order and encourage completion - Check BMP to evaluate electrolyte and renal function - Return to [facility] in about one month for reevaluation - Consider adding metoprolol, spironolactone, or hydralazine based on BMP    Type 2 diabetes mellitus with hyperlipidemia ([place]) Patient has history of diabetes, most recent A1C collected 11-2022 was 7.0 improved from 14 in 08-2022. Currently taking metformin, empagliflozin, glargine 10, and dulaglutide with strong daily adherence. Dulaglutide was added at her last [facility] visit about one month ago. Denies abdominal pain, bloating, nausea, and diarrhea. Glucometer results today in clinic revealed Gluc 89% in target range of 70-180 and 11% in very high >250 range. Commended patient on drastic improvement in A1C and tight glycemic control with her medications.   - Continue metformin 1000mg  q12, empagliflozin 25mg  q24, and dulaglutide 0.75mg  q168 - Continue insulin degludec 10U q24 - Repeat A1C in 02-2023    Healthcare maintenance Patient due for fecal occult blood test next month. Last colonoscopy performed 2014 revealed polyp and repeat never performed. Given superior sensitivity-specificity of colonoscopy and ten-year interval, recommended repeat procedure this year. Patient agreed.  - Gastroenterology referral for colonoscopy      See Encounters Tab for problem based charting.  Patient discussed with  Dr.  Cain Sieve

## 2022-12-19 ENCOUNTER — Ambulatory Visit (INDEPENDENT_AMBULATORY_CARE_PROVIDER_SITE_OTHER): Payer: Medicare (Managed Care) | Admitting: Student

## 2022-12-19 ENCOUNTER — Other Ambulatory Visit: Payer: Self-pay

## 2022-12-19 ENCOUNTER — Encounter: Payer: Self-pay | Admitting: Student

## 2022-12-19 VITALS — BP 188/95 | HR 67 | Temp 98.2°F | Resp 28 | Ht 63.0 in | Wt 167.8 lb

## 2022-12-19 DIAGNOSIS — E785 Hyperlipidemia, unspecified: Secondary | ICD-10-CM | POA: Diagnosis not present

## 2022-12-19 DIAGNOSIS — Z7984 Long term (current) use of oral hypoglycemic drugs: Secondary | ICD-10-CM | POA: Diagnosis not present

## 2022-12-19 DIAGNOSIS — Z1211 Encounter for screening for malignant neoplasm of colon: Secondary | ICD-10-CM

## 2022-12-19 DIAGNOSIS — Z1231 Encounter for screening mammogram for malignant neoplasm of breast: Secondary | ICD-10-CM

## 2022-12-19 DIAGNOSIS — Z794 Long term (current) use of insulin: Secondary | ICD-10-CM

## 2022-12-19 DIAGNOSIS — I10 Essential (primary) hypertension: Secondary | ICD-10-CM | POA: Diagnosis not present

## 2022-12-19 DIAGNOSIS — E1169 Type 2 diabetes mellitus with other specified complication: Secondary | ICD-10-CM | POA: Diagnosis not present

## 2022-12-19 DIAGNOSIS — Z0000 Encounter for general adult medical examination without abnormal findings: Secondary | ICD-10-CM

## 2022-12-19 DIAGNOSIS — I1A0 Resistant hypertension: Secondary | ICD-10-CM

## 2022-12-19 NOTE — Assessment & Plan Note (Addendum)
Patient due for fecal occult blood test next month. Last colonoscopy performed 2014 revealed polyp and repeat never performed. Given superior sensitivity-specificity of colonoscopy and ten-year interval, recommended repeat procedure this year. Patient agreed.  - Gastroenterology referral for colonoscopy

## 2022-12-19 NOTE — Patient Instructions (Signed)
  Thank you, Ms.[name], for allowing [place] to provide your care today. Today we discussed . . .  > Hypertension       - please continue to take your blood pressure medications as prescribed and check your blood pressure at home       - it is important for you to complete a sleep study, which our staff will work on for you       - we have also ordered some labs to evaluate your electrolyte and kidney function       - we will likely start you on a new blood pressure medication but will wait for these test results first  > Diabetes       - keep up the great work taking all of your diabetes medications       - your glucose control over the last few weeks has been outstanding       - continue to eat healthily and start exercising more regularly, which can also help   I have ordered the following labs for you:  Lab Orders         BMP8+Anion Gap        Tests ordered today:  Sleep Study pending . . .   Referrals ordered today:   Referral Orders         Ambulatory referral to Gastroenterology       I have ordered the following medication/changed the following medications:   Stop the following medications: There are no discontinued medications.   Start the following medications: No orders of the defined types were placed in this encounter.     Follow up:  1 month     Remember:  Please continue to take all of your medications and complete the sleep study once you have an exact date. We will call you with the results and see you again in about one month!   Should you have any questions or concerns please call the internal medicine clinic at [phone].     Roswell Nickel, MD [place]

## 2022-12-19 NOTE — Assessment & Plan Note (Signed)
Patient has history of hypertension managed with amlodipine, losartan, and hydrochlorothiazide. She reports daily adherence to these medications including this morning. Of note, adherence was inconsistent during early 2023 but has since improved, especially over the last several months. Denies lightheadedness, dizziness, blurry vision, headache, anxiety, chest pain, and palpitations. Home BP values typically in the 150-160s/70s and today in clinic was 188/95. At her last visit, sleep study was ordered but never completed. Sleep apnea is possible etiology of persistent hypertension despite multiple medications. Further workup may include diagnostic studies for hypercortisolism. Given persistent hypertension, patient will benefit from another antihypertensive medication. Renal function and electrolyte evaluation currently pending with BMP ordered today, which will help determine next agent.  - Continue amlodipine '10mg'$  q24 and losartan-hydrochlorothiazide 100-'25mg'$  q24 - Confirm validity of home sleep study order and encourage completion - Check BMP to evaluate electrolyte and renal function - Return to [facility] in about one month for reevaluation - Consider adding metoprolol, spironolactone, or hydralazine based on BMP

## 2022-12-19 NOTE — Assessment & Plan Note (Signed)
Patient has history of diabetes, most recent A1C collected 11-2022 was 7.0 improved from 14 in 08-2022. Currently taking metformin, empagliflozin, glargine 10, and dulaglutide with strong daily adherence. Dulaglutide was added at her last [facility] visit about one month ago. Denies abdominal pain, bloating, nausea, and diarrhea. Glucometer results today in clinic revealed Gluc 89% in target range of 70-180 and 11% in very high >250 range. Commended patient on drastic improvement in A1C and tight glycemic control with her medications.   - Continue metformin '1000mg'$  q12, empagliflozin '25mg'$  q24, and dulaglutide 0.'75mg'$  q168 - Continue insulin degludec 10U q24 - Repeat A1C in 02-2023

## 2022-12-20 LAB — BMP8+ANION GAP
Anion Gap: 17 mmol/L (ref 10.0–18.0)
BUN/Creatinine Ratio: 15 (ref 12–28)
BUN: 14 mg/dL (ref 8–27)
CO2: 24 mmol/L (ref 20–29)
Calcium: 10.4 mg/dL — ABNORMAL HIGH (ref 8.7–10.3)
Chloride: 101 mmol/L (ref 96–106)
Creatinine, Ser: 0.92 mg/dL (ref 0.57–1.00)
Glucose: 83 mg/dL (ref 70–99)
Potassium: 3.7 mmol/L (ref 3.5–5.2)
Sodium: 142 mmol/L (ref 134–144)
eGFR: 69 mL/min/{1.73_m2} (ref >59)

## 2022-12-20 NOTE — Progress Notes (Signed)
Internal Medicine Clinic Attending  Case discussed with Dr. Jodi Mourning  At the time of the visit.  We reviewed the resident's history and exam and pertinent patient test results.  I agree with the assessment, diagnosis, and plan of care documented in the resident's note.    I agree with Dr. Jacelyn Grip plan for OSA testing as next step in workup of resistant HTN.  BMP with normal kidney function - Dr. Jodi Mourning to start spironolactone or carvedilol.

## 2022-12-25 ENCOUNTER — Other Ambulatory Visit: Payer: Self-pay | Admitting: Student

## 2022-12-25 MED ORDER — CARVEDILOL 6.25 MG PO TABS: 6.2500 mg | ORAL_TABLET | Freq: Two times a day (BID) | ORAL | 3 refills | Status: DC

## 2022-12-26 ENCOUNTER — Telehealth: Payer: Self-pay | Admitting: *Deleted

## 2022-12-26 NOTE — Telephone Encounter (Signed)
Call to patient to schedule a Done Density and Mammogram appointment.  Patient stated lives in [facility] and would like to get studies done in [facility].  Call to [place] where patient had gotten previous Mammogram.   Will need to get orders faxed to [phone] and their office will call patient to schedule a appointment.  Call to patient message left that the [facility] will be calling to schedule her with an appointment.

## 2022-12-26 NOTE — Addendum Note (Signed)
Addended by: Serita Butcher on: 12/26/2022 02:38 PM   Modules accepted: Orders

## 2022-12-27 ENCOUNTER — Other Ambulatory Visit: Payer: Self-pay | Admitting: Student

## 2022-12-27 DIAGNOSIS — I1A0 Resistant hypertension: Secondary | ICD-10-CM

## 2023-01-02 DIAGNOSIS — Z8673 Personal history of transient ischemic attack (TIA), and cerebral infarction without residual deficits: Secondary | ICD-10-CM | POA: Diagnosis not present

## 2023-01-02 DIAGNOSIS — Z961 Presence of intraocular lens: Secondary | ICD-10-CM | POA: Diagnosis not present

## 2023-01-02 DIAGNOSIS — H53462 Homonymous bilateral field defects, left side: Secondary | ICD-10-CM | POA: Diagnosis not present

## 2023-01-02 DIAGNOSIS — H5213 Myopia, bilateral: Secondary | ICD-10-CM | POA: Diagnosis not present

## 2023-01-02 DIAGNOSIS — H52223 Regular astigmatism, bilateral: Secondary | ICD-10-CM | POA: Diagnosis not present

## 2023-01-02 DIAGNOSIS — H40003 Preglaucoma, unspecified, bilateral: Secondary | ICD-10-CM | POA: Diagnosis not present

## 2023-01-29 ENCOUNTER — Encounter: Payer: Medicare (Managed Care) | Admitting: Student

## 2023-02-05 ENCOUNTER — Ambulatory Visit (INDEPENDENT_AMBULATORY_CARE_PROVIDER_SITE_OTHER): Payer: Medicare (Managed Care)

## 2023-02-05 ENCOUNTER — Other Ambulatory Visit: Payer: Self-pay

## 2023-02-05 VITALS — BP 142/68 | HR 70 | Temp 98.2°F | Ht 63.0 in | Wt 176.8 lb

## 2023-02-05 DIAGNOSIS — Z13820 Encounter for screening for osteoporosis: Secondary | ICD-10-CM

## 2023-02-05 DIAGNOSIS — E1169 Type 2 diabetes mellitus with other specified complication: Secondary | ICD-10-CM

## 2023-02-05 DIAGNOSIS — Z1231 Encounter for screening mammogram for malignant neoplasm of breast: Secondary | ICD-10-CM

## 2023-02-05 DIAGNOSIS — Z0000 Encounter for general adult medical examination without abnormal findings: Secondary | ICD-10-CM

## 2023-02-05 DIAGNOSIS — I1A0 Resistant hypertension: Secondary | ICD-10-CM | POA: Diagnosis not present

## 2023-02-05 DIAGNOSIS — E785 Hyperlipidemia, unspecified: Secondary | ICD-10-CM

## 2023-02-05 MED ORDER — INSULIN DEGLUDEC 100 UNIT/ML ~~LOC~~ SOPN: 10.0000 [IU] | PEN_INJECTOR | Freq: Every day | SUBCUTANEOUS | 0 refills | Status: DC

## 2023-02-05 MED ORDER — CARVEDILOL 12.5 MG PO TABS: 12.5000 mg | ORAL_TABLET | Freq: Two times a day (BID) | ORAL | 1 refills | Status: DC

## 2023-02-05 NOTE — Patient Instructions (Addendum)
Cheyenne Valenzuela, it was a pleasure seeing you today! You endorsed feeling well today. Below are some of the things we talked about this visit. We look forward to seeing you in the follow up appointment!  Today we discussed: Colonoscopy: Gastroenterology at [facility] [address] [place], [place] [address]   [phone]  Blood pressure: I will be going up on your dose of coreg to 12.5mg  twice a day and look into the sleep study. We will check your thyroid labs in 4 weeks when you come back for an A1c check.  I sent the mammogram and bone scan referrals to [place].  I have ordered the following labs today:  Lab Orders  No laboratory test(s) ordered today      Referrals ordered today:   Referral Orders  No referral(s) requested today     I have ordered the following medication/changed the following medications:   Stop the following medications: Medications Discontinued During This Encounter  Medication Reason   insulin degludec (TRESIBA) 100 UNIT/ML FlexTouch Pen Reorder   carvedilol (COREG) 6.25 MG tablet Reorder     Start the following medications: Meds ordered this encounter  Medications   insulin degludec (TRESIBA) 100 UNIT/ML FlexTouch Pen    Sig: Inject 10 Units into the skin daily.    Dispense:  9 mL    Refill:  0   carvedilol (COREG) 12.5 MG tablet    Sig: Take 1 tablet (12.5 mg total) by mouth 2 (two) times daily with a meal.    Dispense:  60 tablet    Refill:  1     Follow-up: 1 month A1c, blood pressure   Please make sure to arrive 15 minutes prior to your next appointment. If you arrive late, you may be asked to reschedule.   We look forward to seeing you next time. Please call our clinic at [phone] if you have any questions or concerns. The best time to call is Monday-Friday from 9am-4pm, but there is someone available 24/7. If after hours or the weekend, call the main hospital number and ask for the Internal Medicine  Resident On-Call. If you need medication refills, please notify your pharmacy one week in advance and they will send [place] a request.  Thank you for letting us take part in your care. Wishing you the best!  Thank you, Lyndle Herrlich MD

## 2023-02-05 NOTE — Assessment & Plan Note (Signed)
Taking lipitor 80.

## 2023-02-05 NOTE — Assessment & Plan Note (Signed)
Currently taking amlodipine, coreg 6.25, hyzaar 100-25. No chest pain, blurry vision, BLE edema, dyspnea, no episodes of palpitations. She does have episodes where she has hot flashes and feels lightheaded.

## 2023-02-05 NOTE — Assessment & Plan Note (Signed)
On tresiba 10 units daily, trulicity 0.75mg  weekly, stopped metformin and jardiance for a while during a fast and felt ok, but she feels bad after restarting it.

## 2023-02-06 NOTE — Progress Notes (Signed)
   CC: HTN follow up  HPI:  Cheyenne Valenzuela is a 67 y.o.-year-old female with past medical history as below presenting for blood pressure follow up.  Please see encounters tab for problem-based charting.  Past Medical History:  Diagnosis Date   Cough 06/24/2021   Hypercholesteremia    Hypertension    Influenza A 11/21/2021   Stroke [facility])    Review of Systems: As in HPI.  Please see encounters tab for problem based charting.  Physical Exam:  Vitals:   02/05/23 1621 02/05/23 1714  BP: (!) 153/75 (!) 142/68  Pulse: 72 70  Temp: 98.2 F (36.8 C)   TempSrc: Oral   SpO2: 100%   Weight: 176 lb 12.8 oz (80.2 kg)   Height: 5\' 3"  (1.6 m)    General:Well-appearing, pleasant, In NAD Cardiac: RRR, no murmurs rubs or gallops. Respiratory: Normal work of breathing on room air, CTAB Abdominal: Soft, nontender, nondistended   Assessment & Plan:   Resistant hypertension Currently taking amlodipine, coreg 6.25, hyzaar 100-25. No chest pain, blurry vision, BLE edema, dyspnea, no episodes of palpitations. She does have episodes where she has hot flashes and feels lightheaded. Has not gotten sleep study yet. Exam unremarkable. BP still elevated, 142/68 after recheck. -follow up on sleep study, can send new referral if needed -increase coreg to 12.5mg  BID, continue other regimen -consider thyroid labs at next visit if she continues to have hot flashes  Type 2 diabetes mellitus with hyperlipidemia (HCC) On tresiba 10 units daily, trulicity 0.75mg  weekly, stopped metformin and jardiance for a while during a fast and felt ok, but she feels bad after restarting it, willing to conitnue taking -recheck A1c in 1 month and adjust regimen if needed  Hyperlipidemia Taking lipitor 80.  Healthcare maintenance Sent mammogram, dexa referrals to [facility] locations. Provided her the contact info for GI referral for colonoscopy    Patient discussed with Dr.  Lafonda Mosses

## 2023-02-06 NOTE — Assessment & Plan Note (Signed)
Sent mammogram, dexa referrals to [facility] locations. Provided her the contact info for GI referral for colonoscopy

## 2023-02-06 NOTE — Addendum Note (Signed)
Addended byLyndle Herrlich on: 02/06/2023 10:21 AM   Modules accepted: Orders

## 2023-02-06 NOTE — Addendum Note (Signed)
Addended byLyndle Herrlich on: 02/06/2023 10:17 AM   Modules accepted: Orders

## 2023-02-07 NOTE — Progress Notes (Signed)
Internal Medicine Clinic Attending  Case discussed with Dr. Sridharan  At the time of the visit.  We reviewed the resident's history and exam and pertinent patient test results.  I agree with the assessment, diagnosis, and plan of care documented in the resident's note.  

## 2023-02-09 ENCOUNTER — Telehealth (HOSPITAL_BASED_OUTPATIENT_CLINIC_OR_DEPARTMENT_OTHER): Payer: Self-pay

## 2023-02-12 NOTE — Telephone Encounter (Signed)
Rec'd a call from the patient. Pt is unable to sch her Mammogram appointment.  Please see prev notes below.  Pt had a Diagnostic Mammogram and did not f/u as requested in 6 months.  Call[facility] and [facility] with [facility].  Pls let their office know if a another Diagnostic order is needed or a 3D Mammogram is needed instead. If so a new order will need to be placed.   08/31/2021 2:26 PM EST  CLINICAL DATA:  Callback from screening mammogram for calcifications right breast  EXAM: DIGITAL DIAGNOSTIC UNILATERAL RIGHT MAMMOGRAM WITH TOMOSYNTHESIS AND CAD  TECHNIQUE: Right digital diagnostic mammography and breast tomosynthesis was performed. The images were evaluated with computer-aided detection.  COMPARISON:  Prior films  ACR Breast Density Category c: The breast tissue is heterogeneously dense, which may obscure small masses.  FINDINGS: Lateral view of right breast, spot magnification cc and lateral views of the right breast are submitted. There are scattered round calcifications in the upper-outer quadrant right breast probably not significantly changed compared prior exam of 2017.  IMPRESSION: Probable benign findings.  RECOMMENDATION: Six-month follow-up mammogram right breast.  I have discussed the findings and recommendations with the patient. If applicable, a reminder letter will be sent to the patient regarding the next appointment.  BI-RADS CATEGORY  3: Probably benign.   Electronically Signed  By: Sherian Rein M.D.  On: 08/31/2021 14:26

## 2023-02-13 ENCOUNTER — Inpatient Hospital Stay (HOSPITAL_BASED_OUTPATIENT_CLINIC_OR_DEPARTMENT_OTHER): Admission: RE | Admit: 2023-02-13 | Payer: Medicare (Managed Care) | Source: Ambulatory Visit

## 2023-02-13 ENCOUNTER — Ambulatory Visit (HOSPITAL_BASED_OUTPATIENT_CLINIC_OR_DEPARTMENT_OTHER)
Admission: RE | Admit: 2023-02-13 | Discharge: 2023-02-13 | Disposition: A | Discharge status: Home / Self Care | Payer: Medicare (Managed Care) | Source: Ambulatory Visit | Attending: Internal Medicine | Admitting: Internal Medicine

## 2023-02-13 ENCOUNTER — Other Ambulatory Visit: Payer: Self-pay

## 2023-02-13 DIAGNOSIS — Z780 Asymptomatic menopausal state: Secondary | ICD-10-CM | POA: Insufficient documentation

## 2023-02-13 DIAGNOSIS — Z13820 Encounter for screening for osteoporosis: Secondary | ICD-10-CM | POA: Diagnosis present

## 2023-02-13 DIAGNOSIS — Z0000 Encounter for general adult medical examination without abnormal findings: Secondary | ICD-10-CM

## 2023-02-13 NOTE — Telephone Encounter (Signed)
Called [place] imaging center who states only GI/Breast center can perform diagnostic mammogram. Order switched for GI/Breast center.

## 2023-02-13 NOTE — Addendum Note (Signed)
Addended by: Karoline Caldwell on: 02/13/2023 11:13 AM   Modules accepted: Orders

## 2023-02-13 NOTE — Telephone Encounter (Signed)
Diagnostic mammogram of the right breast ordered to [place].

## 2023-02-20 ENCOUNTER — Other Ambulatory Visit: Payer: Self-pay | Admitting: Student

## 2023-02-20 DIAGNOSIS — I10 Essential (primary) hypertension: Secondary | ICD-10-CM

## 2023-02-20 DIAGNOSIS — Z8673 Personal history of transient ischemic attack (TIA), and cerebral infarction without residual deficits: Secondary | ICD-10-CM

## 2023-02-26 ENCOUNTER — Other Ambulatory Visit: Payer: Self-pay | Admitting: Student

## 2023-02-26 DIAGNOSIS — E1169 Type 2 diabetes mellitus with other specified complication: Secondary | ICD-10-CM

## 2023-03-01 ENCOUNTER — Telehealth: Payer: Self-pay | Admitting: Student

## 2023-03-01 NOTE — Telephone Encounter (Signed)
  TRULICITY 0.75 MG/0.5ML SOPN     [id number] - [place],  - [address] (Ph: [phone])

## 2023-03-01 NOTE — Telephone Encounter (Signed)
Rx sent to [facility] today at 1001.

## 2023-03-12 ENCOUNTER — Ambulatory Visit (INDEPENDENT_AMBULATORY_CARE_PROVIDER_SITE_OTHER): Payer: Medicare (Managed Care) | Admitting: Student

## 2023-03-12 ENCOUNTER — Encounter: Payer: Self-pay | Admitting: Gastroenterology

## 2023-03-12 VITALS — BP 160/86 | HR 70 | Temp 97.9°F | Ht 63.0 in | Wt 174.4 lb

## 2023-03-12 DIAGNOSIS — E1169 Type 2 diabetes mellitus with other specified complication: Secondary | ICD-10-CM | POA: Diagnosis not present

## 2023-03-12 DIAGNOSIS — E785 Hyperlipidemia, unspecified: Secondary | ICD-10-CM

## 2023-03-12 DIAGNOSIS — Z8673 Personal history of transient ischemic attack (TIA), and cerebral infarction without residual deficits: Secondary | ICD-10-CM

## 2023-03-12 DIAGNOSIS — I10 Essential (primary) hypertension: Secondary | ICD-10-CM

## 2023-03-12 DIAGNOSIS — Z794 Long term (current) use of insulin: Secondary | ICD-10-CM | POA: Diagnosis not present

## 2023-03-12 DIAGNOSIS — Z7984 Long term (current) use of oral hypoglycemic drugs: Secondary | ICD-10-CM | POA: Diagnosis not present

## 2023-03-12 DIAGNOSIS — Z0000 Encounter for general adult medical examination without abnormal findings: Secondary | ICD-10-CM

## 2023-03-12 DIAGNOSIS — I1A0 Resistant hypertension: Secondary | ICD-10-CM | POA: Diagnosis not present

## 2023-03-12 DIAGNOSIS — E119 Type 2 diabetes mellitus without complications: Secondary | ICD-10-CM

## 2023-03-12 LAB — POCT GLYCOSYLATED HEMOGLOBIN (HGB A1C): Hemoglobin A1C: 6.7 % — AB (ref 4.0–5.6)

## 2023-03-12 LAB — GLUCOSE, CAPILLARY: Glucose-Capillary: 121 mg/dL — ABNORMAL HIGH (ref 70–99)

## 2023-03-12 MED ORDER — EZETIMIBE 10 MG PO TABS
10.0000 mg | ORAL_TABLET | Freq: Every day | ORAL | 1 refills | Status: DC
Start: 2023-03-12 — End: 2023-09-17

## 2023-03-12 MED ORDER — INSULIN DEGLUDEC 100 UNIT/ML ~~LOC~~ SOPN
8.0000 [IU] | PEN_INJECTOR | Freq: Every day | SUBCUTANEOUS | 0 refills | Status: AC
Start: 2023-03-12 — End: 2023-06-10

## 2023-03-12 MED ORDER — LOSARTAN POTASSIUM-HCTZ 100-25 MG PO TABS: 1.0000 | ORAL_TABLET | Freq: Every day | ORAL | 2 refills | Status: DC

## 2023-03-12 MED ORDER — SPIRONOLACTONE 25 MG PO TABS
25.0000 mg | ORAL_TABLET | Freq: Every day | ORAL | 11 refills | Status: DC
Start: 2023-03-12 — End: 2023-09-27

## 2023-03-12 MED ORDER — EMPAGLIFLOZIN 25 MG PO TABS: 25.0000 mg | ORAL_TABLET | Freq: Every day | ORAL | 11 refills | Status: DC

## 2023-03-12 MED ORDER — METFORMIN HCL 1000 MG PO TABS: 1000.0000 mg | ORAL_TABLET | Freq: Two times a day (BID) | ORAL | 2 refills | Status: DC

## 2023-03-12 NOTE — Patient Instructions (Signed)
[name],  It was a pleasure seeing you in the clinic today.   For your blood pressure: it is still high on your visit today. As we discussed, we will start a new blood pressure medicine called Sprinolactone (once daily) to help better control your blood pressure. I am also checking a lab test to check for another cause of high blood pressure and I will call you with the result. I have also ordered a sleep study to check for sleep apnea as this can also cause high blood pressure. For your diabetes: your A1c looks much better today so great work! Because there were some lows in the afternoons, we are decreasing your tresiba to 8 units daily. We are also checking your cholesterol level today and I will call you when I have the results. You got your colonoscopy appointment with the stomach doctor so please make sure to go to that appointment. Please come back in 1 month to recheck your blood pressure.  Please call our clinic at [phone] if you have any questions or concerns. The best time to call is Monday-Friday from 9am-4pm, but there is someone available 24/7 at the same number. If you need medication refills, please notify your pharmacy one week in advance and they will send [place] a request.   Thank you for letting us take part in your care. We look forward to seeing you next time!

## 2023-03-12 NOTE — Assessment & Plan Note (Signed)
LDL 156 in 05/2022. She is on lipitor 80mg  daily and zetia 10mg  daily. Reports good adherence. Will check lipid panel today to reassess LDL with goal <70 given prior CVA history. May need to consider PCSK9 inhibitor or bempedoic acid in the future if needed to bring LDL to goal.  Plan: -continue lipitor, zetia -f/u lipid panel  ADDENDUM: Lipid Panel with LDL 169, which is above goal given need for secondary stroke prevention. Discussed with her via phone call, she does report good adherence with her lipitor and with zetia. She had an LDL of 267 about 1 year ago and thus did have a decent reduction in LDL with lipitor and zetia. However, given LDL goal <70, she will need a PCSK-9 inhibitor for further LDL lowering. I discussed with her and we agreed on referral to lipid clinic for consideration of PCSK-9 inhibitor therapy.  -continue lipitor, zetia -referral to lipid clinic for PCSK-9i therapy -repeat lipid panel in 3-6 months

## 2023-03-12 NOTE — Assessment & Plan Note (Signed)
Currently on tresiba 10u daily, jardiance 25mg  daily, trulicity 0.75mg  weekly, and metformin 1000mg  BID. Her A1c improved from 7% to 6.7% today. Although she remained asymptomatic throughout, her glucometer does show lows (<1%) in the early afternoons. She states that she has a good breakfast and dinner but does not regularly eat lunch. Given great control, will decrease her tresiba to 8u daily and reassess A1c in 3 months. In the future, can continue to increase tresiba as tolerated while coming down further on insulin therapy.  Plan: -continue metformin, trulicity, jardiance -decrease tresiba to 8u daily -next A1c check in 3 months

## 2023-03-12 NOTE — Assessment & Plan Note (Signed)
BP elevated at 163/84 with repeat 160/86. She is on coreg 12.5mg  BID, norvasc 10mg  daily, jardiance 25mg  daily, and losartan-HCTZ 100-25mg  daily. She reports good adherence with these medications. Given history of CVA, her BP remains above goal despite 5 antihypertensives. Will check a renin-aldo level today and also placed order for sleep study to evaluate for OSA. Will start spironolactone 25mg  daily (following renin-aldo level) and will have her come back in 1 month for repeat BP check.  Plan: -continue current medications -start spironolactone 25mg  daily -f/u renin-aldo level -sleep study ordered -f/u in 1 month for repeat BP check

## 2023-03-12 NOTE — Assessment & Plan Note (Signed)
Some confusion surrounding colonoscopy. Chilon has helped to schedule an appointment with [facility] in September for initial consult evaluation given she is on eliquis. GI will subsequently schedule colonoscopy after initial consult visit.

## 2023-03-12 NOTE — Progress Notes (Signed)
   CC: f/u T2DM, HTN, HLD  HPI:  [name] is a 67 y.o. female with history listed below presenting to the [facility] for f/u T2DM, HTN, HLD. Please see individualized problem based charting for full HPI.  Past Medical History:  Diagnosis Date   Cough 06/24/2021   Hypercholesteremia    Hypertension    Influenza A 11/21/2021   Stroke [facility])     Review of Systems:  Negative aside from that listed in individualized problem based charting.  Physical Exam:  Vitals:   03/12/23 1521 03/12/23 1605  BP: (!) 163/84 (!) 160/86  Pulse: 74 70  Temp: 97.9 F (36.6 C)   TempSrc: Oral   SpO2: 100%   Weight: 174 lb 6.4 oz (79.1 kg)   Height: 5\' 3"  (1.6 m)    Physical Exam Constitutional:      Appearance: Normal appearance. She is obese. She is not ill-appearing.  HENT:     Mouth/Throat:     Mouth: Mucous membranes are moist.     Pharynx: Oropharynx is clear. No oropharyngeal exudate.  Eyes:     General: No scleral icterus.    Extraocular Movements: Extraocular movements intact.     Conjunctiva/sclera: Conjunctivae normal.     Pupils: Pupils are equal, round, and reactive to light.  Cardiovascular:     Rate and Rhythm: Normal rate and regular rhythm.  Pulmonary:     Effort: Pulmonary effort is normal.     Breath sounds: Normal breath sounds. No wheezing, rhonchi or rales.  Abdominal:     General: Bowel sounds are normal. There is no distension.     Palpations: Abdomen is soft.     Tenderness: There is no abdominal tenderness. There is no guarding or rebound.  Musculoskeletal:        General: No swelling. Normal range of motion.  Skin:    General: Skin is warm and dry.  Neurological:     Mental Status: She is alert and oriented to person, place, and time. Mental status is at baseline.  Psychiatric:        Mood and Affect: Mood normal.        Behavior: Behavior normal.      Assessment & Plan:   See Encounters Tab for problem based charting.  Patient discussed with Dr.   Lafonda Mosses

## 2023-03-13 ENCOUNTER — Encounter: Payer: Self-pay | Admitting: *Deleted

## 2023-03-13 LAB — LIPID PANEL
Chol/HDL Ratio: 7.9 ratio — ABNORMAL HIGH (ref 0.0–4.4)
Cholesterol, Total: 254 mg/dL — ABNORMAL HIGH (ref 100–199)
HDL: 32 mg/dL — ABNORMAL LOW (ref >39)
LDL Chol Calc (NIH): 169 mg/dL — ABNORMAL HIGH (ref 0–99)
Triglycerides: 277 mg/dL — ABNORMAL HIGH (ref 0–149)
VLDL Cholesterol Cal: 53 mg/dL — ABNORMAL HIGH (ref 5–40)

## 2023-03-13 NOTE — Addendum Note (Signed)
Addended by: Merrilyn Puma on: 03/13/2023 09:51 AM   Modules accepted: Orders

## 2023-03-13 NOTE — Progress Notes (Signed)
Internal Medicine Clinic Attending  Case discussed with Dr. Austin Miles  At the time of the visit.  We reviewed the resident's history and exam and pertinent patient test results.  I agree with the assessment, diagnosis, and plan of care documented in the resident's note.    I agree with Dr. Everardo Pacific plan for PCSK9 since her LDL is still 169 on statin + zetia

## 2023-03-15 ENCOUNTER — Ambulatory Visit
Admission: RE | Admit: 2023-03-15 | Discharge: 2023-03-15 | Disposition: A | Discharge status: Home / Self Care | Payer: Medicare (Managed Care) | Source: Ambulatory Visit | Attending: Internal Medicine | Admitting: Internal Medicine

## 2023-03-15 DIAGNOSIS — Z0000 Encounter for general adult medical examination without abnormal findings: Secondary | ICD-10-CM

## 2023-03-15 DIAGNOSIS — R921 Mammographic calcification found on diagnostic imaging of breast: Secondary | ICD-10-CM | POA: Diagnosis not present

## 2023-03-16 ENCOUNTER — Other Ambulatory Visit: Payer: Self-pay | Admitting: Internal Medicine

## 2023-03-16 DIAGNOSIS — R921 Mammographic calcification found on diagnostic imaging of breast: Secondary | ICD-10-CM

## 2023-03-19 ENCOUNTER — Other Ambulatory Visit: Payer: Self-pay

## 2023-03-19 DIAGNOSIS — R921 Mammographic calcification found on diagnostic imaging of breast: Secondary | ICD-10-CM

## 2023-03-19 NOTE — Progress Notes (Signed)
Called patient and updated on results. Discussed findings are likely benign. Discussed need for repeat diagnostic mammogram of the right breast in 6 months, which would demonstrate 2 years of stability of her right breast calcifications. Patient understands and agrees with the plan.

## 2023-03-20 ENCOUNTER — Ambulatory Visit: Payer: Medicare (Managed Care) | Attending: Cardiovascular Disease | Admitting: Student

## 2023-03-20 ENCOUNTER — Telehealth: Payer: Self-pay | Admitting: Pharmacist

## 2023-03-20 ENCOUNTER — Other Ambulatory Visit (HOSPITAL_COMMUNITY): Payer: Self-pay

## 2023-03-20 DIAGNOSIS — E782 Mixed hyperlipidemia: Secondary | ICD-10-CM | POA: Diagnosis not present

## 2023-03-20 NOTE — Assessment & Plan Note (Addendum)
Assessment:  LDL goal: <55 mg/dl last LDLc  409 mg/dl (81/19/1478) TG 295  Tolerates Zetia and high intensity statins well without any side effects  Eats lots of fruits and eats out once week , alcohol - none  Discussed next potential options (PCSK-9 inhibitors, bempedoic acid and inclisiran); cost, dosing efficacy, side effects   Plan: Continue taking current medications - Lipitor 80 mg daily and Zetia 10 mg daily  Will apply for PA for PCSK9i; will inform patient upon approval  Cut down on carb intake replace it with non-starchy vegetables -will help lowering TG Start doing resistance exercise every other day and continue walking 30-45 min every other day  Lipid lab due in 2-3 months after starting [facility]

## 2023-03-20 NOTE — Patient Instructions (Signed)
Your Results:             Your most recent labs Goal  Total Cholesterol 254 < 200  Triglycerides 277 < 150  HDL (happy/good cholesterol) 32 > 40  LDL (lousy/bad cholesterol 169 < 55   Medication changes: We will start the process to get PCSK9i (Repatha or Praluent)  covered by your insurance.  Once the prior authorization is complete, we will call you to let you know and confirm pharmacy information.     Praluent is a cholesterol medication that improved your body's ability to get rid of "bad cholesterol" known as LDL. It can lower your LDL up to 60%. It is an injection that is given under the skin every 2 weeks. The most common side effects of Praluent include runny nose, symptoms of the common cold, rarely flu or flu-like symptoms, back/muscle pain in about 3-4% of the patients, and redness, pain, or bruising at the injection site.    Repatha is a cholesterol medication that improved your body's ability to get rid of "bad cholesterol" known as LDL. It can lower your LDL up to 60%! It is an injection that is given under the skin every 2 weeks. The most common side effects of Repatha include runny nose, symptoms of the common cold, rarely flu or flu-like symptoms, back/muscle pain in about 3-4% of the patients, and redness, pain, or bruising at the injection site.   Lab orders: We want to repeat labs after 2-3 months.  We will send you a lab order to remind you once we get closer to that time.

## 2023-03-20 NOTE — Progress Notes (Signed)
Patient ID: Cheyenne Valenzuela                 DOB: 06/09/1956                    MRN: [id number]      HPI: Cheyenne Valenzuela is a 67 y.o. female patient referred to lipid clinic by Dr. Austin Miles. PMH is significant for CVA, HTN,HDL,T2DM, Afib. Patient reported good adherence with her Lipitor and with Zetia. She had an LDL of 267 about 1 year ago recent lab reflects  a decent drop in LDL with Lipitor and Zetia.    Patient presented today for lipid clinic. Reports she has been taking Lipitor 80 mg and Zetia 10 mg for a year now and she tolerates them well. She eats out once week. She follows diet plan that is given by nutritionist. However her diet plan has lots of fruits - advised to cut down on fruit intake and increase non starchy vegetables intake. She does not go for walk by herself as per peripheral vision in one eye got worst however she walks around her house at least for an hour every other day. We reviewed options for secondary prevention , PCSK-9 inhibitors, bempedoic acid and inclisiran. Discussed mechanisms of action, dosing, side effects and potential decreases in LDL cholesterol.  Also reviewed cost information and potential options for patient assistance.    Diet:  eats lots of fruits and vegetables lot less meat  Eats out once week - suggest to cut down to every other week   Exercise: 1 hour walk every other day    Family History:  Mom: diabetes, HTN Siblings: 2 sisters- diabetes Social History:  Alcohol: none Smoking: quit 6 years ago  Recreational drug use: never  Labs: Lipid Panel     Component Value Date/Time   CHOL 254 (H) 03/12/2023 1624   TRIG 277 (H) 03/12/2023 1624   HDL 32 (L) 03/12/2023 1624   CHOLHDL 7.9 (H) 03/12/2023 1624   CHOLHDL 6.8 02/04/2022 0334   VLDL 47 (H) 02/04/2022 0334   LDLCALC 169 (H) 03/12/2023 1624   LABVLDL 53 (H) 03/12/2023 1624    Past Medical History:  Diagnosis Date   Cough 06/24/2021   Hypercholesteremia    Hypertension    Influenza A  11/21/2021   Stroke (HCC)     Current Outpatient Medications on File Prior to Visit  Medication Sig Dispense Refill   amLODipine (NORVASC) 10 MG tablet TAKE 1 TABLET(10 MG) BY MOUTH DAILY 90 tablet 0   apixaban (ELIQUIS) 5 MG TABS tablet Take 1 tablet (5 mg total) by mouth 2 (two) times daily. 60 tablet 11   atorvastatin (LIPITOR) 80 MG tablet Take 1 tablet (80 mg total) by mouth daily. 100 tablet 2   Blood Glucose Monitoring Suppl (ACCU-CHEK GUIDE) w/Device KIT Check blood sugar 3 times per day. 1 kit 1   carvedilol (COREG) 12.5 MG tablet TAKE 1 TABLET(12.5 MG) BY MOUTH TWICE DAILY WITH A MEAL 180 tablet 0   empagliflozin (JARDIANCE) 25 MG TABS tablet Take 1 tablet (25 mg total) by mouth daily before breakfast. 30 tablet 11   ezetimibe (ZETIA) 10 MG tablet Take 1 tablet (10 mg total) by mouth daily. 90 tablet 1   glucose blood (ONETOUCH VERIO) test strip Check blood sugar up to 3 times a day. 300 each 3   insulin degludec (TRESIBA) 100 UNIT/ML FlexTouch Pen Inject 8 Units into the skin daily. 9 mL 0   Insulin  Pen Needle (PEN NEEDLES) 32G X 4 MM MISC 1 Needle by Does not apply route daily. 200 each 1   Lancets (ONETOUCH DELICA PLUS LANCET33G) MISC Check blood sugar up to 3 times a day 300 each 3   losartan-hydrochlorothiazide (HYZAAR) 100-25 MG tablet Take 1 tablet by mouth daily. 100 tablet 2   metFORMIN (GLUCOPHAGE) 1000 MG tablet Take 1 tablet (1,000 mg total) by mouth 2 (two) times daily with a meal. 200 tablet 2   spironolactone (ALDACTONE) 25 MG tablet Take 1 tablet (25 mg total) by mouth daily. 30 tablet 11   TRULICITY 0.75 MG/0.5ML SOPN ADMINISTER 0.75 MG UNDER THE SKIN 1 TIME A WEEK 2 mL 2   No current facility-administered medications on file prior to visit.    Allergies  Allergen Reactions   Lisinopril Swelling    Assessment/Plan:  1. Hyperlipidemia -  Problem  Hyperlipidemia   Current Medications: Lipitor 80 mg daily, Zetia 10 mg daily  Intolerances: none Risk  Factors: T2DM, hx of stroke, HTN LDL goal: <55 mg/dl  Last Lab : LDLc 161 HDL 32 TG 277 TC 254    Hyperlipidemia Assessment:  LDL goal: <55 mg/dl last LDLc  096 mg/dl (04/54/0981) TG 191  Tolerates Zetia and high intensity statins well without any side effects  Eats lots of fruits and eats out once week , alcohol - none  Discussed next potential options (PCSK-9 inhibitors, bempedoic acid and inclisiran); cost, dosing efficacy, side effects   Plan: Continue taking current medications - Lipitor 80 mg daily and Zetia 10 mg daily  Will apply for PA for PCSK9i; will inform patient upon approval  Cut down on carb intake replace it with non-starchy vegetables -will help lowering TG Start doing resistance exercise every other day and continue walking 30-45 min every other day  Lipid lab due in 2-3 months after starting PCSK9i    Thank you,  Carmela Hurt, Pharm.D [facility] A Division of  [facility] [address] [place], [place] [address]  Phone: [phone]; Fax: [phone]

## 2023-03-20 NOTE — Telephone Encounter (Signed)
What provider will be prescribing this? Just need to know what NPI to use to submit PA.

## 2023-03-21 ENCOUNTER — Telehealth: Payer: Self-pay

## 2023-03-21 MED ORDER — REPATHA SURECLICK 140 MG/ML ~~LOC~~ SOAJ: 140.0000 mg | SUBCUTANEOUS | 3 refills | Status: AC

## 2023-03-21 NOTE — Telephone Encounter (Signed)
Pharmacy Patient Advocate Encounter  Prior Authorization for REPATHA has been approved.    PA# [id number] Effective dates: 02/19/23 through 03/20/24  Haze Rushing, CPhT Pharmacy Patient Advocate Specialist Direct Number: [phone] Fax: [phone]

## 2023-03-21 NOTE — Addendum Note (Signed)
Addended by: Tylene Fantasia on: 03/21/2023 03:34 PM   Modules accepted: Orders

## 2023-03-21 NOTE — Telephone Encounter (Signed)
PA initiated for Repatha, please see separate encounter for updates on determination. (I will route you back in once a decision has been made)  Haze Rushing, CPhT Pharmacy Patient Advocate Specialist Direct Number: [phone] Fax: [phone]

## 2023-03-21 NOTE — Telephone Encounter (Signed)
Pharmacy Patient Advocate Encounter   Received notification from [employer] that prior authorization for REPATHA is needed.    PA submitted on 03/21/23 Key [id number] Status is pending  Haze Rushing, CPhT Pharmacy Patient Advocate Specialist Direct Number: [phone] Fax: [phone]

## 2023-03-30 ENCOUNTER — Encounter: Payer: Self-pay | Admitting: Gastroenterology

## 2023-04-27 ENCOUNTER — Encounter: Payer: Self-pay | Admitting: Gastroenterology

## 2023-05-10 ENCOUNTER — Encounter: Payer: Medicare (Managed Care) | Admitting: Student

## 2023-05-11 ENCOUNTER — Telehealth: Payer: Self-pay

## 2023-05-11 NOTE — Progress Notes (Signed)
Patient attempted to be outreached by Sofie Rower, PharmD to discuss hypertension. Left voicemail for patient to return our call at their convenience at [phone]  Sofie Rower, PharmD

## 2023-05-14 ENCOUNTER — Encounter: Payer: Medicare (Managed Care) | Admitting: Student

## 2023-05-14 DIAGNOSIS — I651 Occlusion and stenosis of basilar artery: Secondary | ICD-10-CM | POA: Diagnosis not present

## 2023-05-14 DIAGNOSIS — I672 Cerebral atherosclerosis: Secondary | ICD-10-CM | POA: Diagnosis not present

## 2023-05-14 DIAGNOSIS — E1122 Type 2 diabetes mellitus with diabetic chronic kidney disease: Secondary | ICD-10-CM | POA: Diagnosis not present

## 2023-05-14 DIAGNOSIS — Z7984 Long term (current) use of oral hypoglycemic drugs: Secondary | ICD-10-CM | POA: Diagnosis not present

## 2023-05-14 DIAGNOSIS — Z7901 Long term (current) use of anticoagulants: Secondary | ICD-10-CM | POA: Diagnosis not present

## 2023-05-14 DIAGNOSIS — E039 Hypothyroidism, unspecified: Secondary | ICD-10-CM | POA: Diagnosis not present

## 2023-05-14 DIAGNOSIS — R29810 Facial weakness: Secondary | ICD-10-CM | POA: Diagnosis not present

## 2023-05-14 DIAGNOSIS — I10 Essential (primary) hypertension: Secondary | ICD-10-CM | POA: Diagnosis not present

## 2023-05-14 DIAGNOSIS — Z794 Long term (current) use of insulin: Secondary | ICD-10-CM | POA: Diagnosis not present

## 2023-05-14 DIAGNOSIS — I129 Hypertensive chronic kidney disease with stage 1 through stage 4 chronic kidney disease, or unspecified chronic kidney disease: Secondary | ICD-10-CM | POA: Diagnosis not present

## 2023-05-14 DIAGNOSIS — I771 Stricture of artery: Secondary | ICD-10-CM | POA: Diagnosis not present

## 2023-05-14 DIAGNOSIS — Z79899 Other long term (current) drug therapy: Secondary | ICD-10-CM | POA: Diagnosis not present

## 2023-05-14 DIAGNOSIS — R42 Dizziness and giddiness: Secondary | ICD-10-CM | POA: Diagnosis not present

## 2023-05-14 DIAGNOSIS — R29818 Other symptoms and signs involving the nervous system: Secondary | ICD-10-CM | POA: Diagnosis not present

## 2023-05-14 DIAGNOSIS — R55 Syncope and collapse: Secondary | ICD-10-CM | POA: Diagnosis not present

## 2023-05-14 DIAGNOSIS — I6523 Occlusion and stenosis of bilateral carotid arteries: Secondary | ICD-10-CM | POA: Diagnosis not present

## 2023-05-14 DIAGNOSIS — G9389 Other specified disorders of brain: Secondary | ICD-10-CM | POA: Diagnosis not present

## 2023-05-14 DIAGNOSIS — R471 Dysarthria and anarthria: Secondary | ICD-10-CM | POA: Diagnosis not present

## 2023-05-14 DIAGNOSIS — G459 Transient cerebral ischemic attack, unspecified: Secondary | ICD-10-CM | POA: Diagnosis not present

## 2023-05-14 DIAGNOSIS — E1169 Type 2 diabetes mellitus with other specified complication: Secondary | ICD-10-CM | POA: Diagnosis not present

## 2023-05-14 DIAGNOSIS — F09 Unspecified mental disorder due to known physiological condition: Secondary | ICD-10-CM | POA: Diagnosis not present

## 2023-05-14 DIAGNOSIS — E782 Mixed hyperlipidemia: Secondary | ICD-10-CM | POA: Diagnosis not present

## 2023-05-14 DIAGNOSIS — Z79890 Hormone replacement therapy (postmenopausal): Secondary | ICD-10-CM | POA: Diagnosis not present

## 2023-05-14 DIAGNOSIS — R4781 Slurred speech: Secondary | ICD-10-CM | POA: Diagnosis not present

## 2023-05-14 DIAGNOSIS — N182 Chronic kidney disease, stage 2 (mild): Secondary | ICD-10-CM | POA: Diagnosis not present

## 2023-05-14 DIAGNOSIS — I6503 Occlusion and stenosis of bilateral vertebral arteries: Secondary | ICD-10-CM | POA: Diagnosis not present

## 2023-05-14 DIAGNOSIS — I77810 Thoracic aortic ectasia: Secondary | ICD-10-CM | POA: Diagnosis not present

## 2023-05-14 DIAGNOSIS — I440 Atrioventricular block, first degree: Secondary | ICD-10-CM | POA: Diagnosis not present

## 2023-05-15 DIAGNOSIS — R55 Syncope and collapse: Secondary | ICD-10-CM | POA: Diagnosis not present

## 2023-05-17 NOTE — Telephone Encounter (Signed)
Called patient to remind for follow up FLP. Patient will be going either on Aug 13 or Aug 14 at [facility].

## 2023-06-05 ENCOUNTER — Telehealth: Payer: Self-pay | Admitting: Pharmacist

## 2023-06-05 DIAGNOSIS — R55 Syncope and collapse: Secondary | ICD-10-CM | POA: Diagnosis not present

## 2023-06-05 DIAGNOSIS — Z7901 Long term (current) use of anticoagulants: Secondary | ICD-10-CM | POA: Diagnosis not present

## 2023-06-05 DIAGNOSIS — Z8673 Personal history of transient ischemic attack (TIA), and cerebral infarction without residual deficits: Secondary | ICD-10-CM | POA: Diagnosis not present

## 2023-06-05 NOTE — Telephone Encounter (Signed)
Repatha started in June, follow up FLP is due. Call to remind patient, N/A, LVM.

## 2023-06-07 ENCOUNTER — Encounter: Payer: Self-pay | Admitting: *Deleted

## 2023-06-07 ENCOUNTER — Ambulatory Visit (INDEPENDENT_AMBULATORY_CARE_PROVIDER_SITE_OTHER): Payer: Medicare (Managed Care) | Admitting: *Deleted

## 2023-06-07 ENCOUNTER — Ambulatory Visit (INDEPENDENT_AMBULATORY_CARE_PROVIDER_SITE_OTHER): Payer: Medicare (Managed Care) | Admitting: Student

## 2023-06-07 VITALS — BP 173/78 | HR 69 | Wt 175.7 lb

## 2023-06-07 VITALS — BP 173/78 | HR 69 | Temp 98.2°F | Wt 175.6 lb

## 2023-06-07 DIAGNOSIS — I1A0 Resistant hypertension: Secondary | ICD-10-CM | POA: Diagnosis not present

## 2023-06-07 DIAGNOSIS — E785 Hyperlipidemia, unspecified: Secondary | ICD-10-CM

## 2023-06-07 DIAGNOSIS — I4891 Unspecified atrial fibrillation: Secondary | ICD-10-CM

## 2023-06-07 DIAGNOSIS — Z7901 Long term (current) use of anticoagulants: Secondary | ICD-10-CM

## 2023-06-07 DIAGNOSIS — Z23 Encounter for immunization: Secondary | ICD-10-CM | POA: Diagnosis not present

## 2023-06-07 DIAGNOSIS — I10 Essential (primary) hypertension: Secondary | ICD-10-CM

## 2023-06-07 DIAGNOSIS — Z0000 Encounter for general adult medical examination without abnormal findings: Secondary | ICD-10-CM | POA: Diagnosis not present

## 2023-06-07 MED ORDER — LOSARTAN POTASSIUM 100 MG PO TABS
100.0000 mg | ORAL_TABLET | Freq: Every day | ORAL | 11 refills | Status: AC
Start: 2023-06-07 — End: 2024-06-06

## 2023-06-07 NOTE — Patient Instructions (Signed)

## 2023-06-07 NOTE — Patient Instructions (Signed)
Thank you so much for coming to the clinic today!   Today, we discussed your hypertension.  We added 100 mg of losartan for you to take once per day.  Please continue to take your blood pressure measurements at home and record them.  Also, please let us know via MyChart what these recordings are.  We will schedule you for a nurse visit in the next few weeks to reevaluate your blood pressures.  If you have any questions please feel free to the call the clinic at anytime at [phone]. It was a pleasure seeing you!  Best, Dr. Rayvon Char

## 2023-06-07 NOTE — Assessment & Plan Note (Signed)
On 03/12/23, total cholesterol 254, HDL 32, LDL 169, triglycerides 277.  - Continue medication regimen

## 2023-06-07 NOTE — Progress Notes (Signed)
CC: hospital follow-up and wellness exam   HPI: [name] is a 67 y.o. female living with a history stated below and presents today for hospital follow up and wellness exam. Please see problem based assessment and plan for additional details.  Past Medical History:  Diagnosis Date   Cough 06/24/2021   Hypercholesteremia    Hypertension    Influenza A 11/21/2021   Stroke [facility])     Current Outpatient Medications on File Prior to Visit  Medication Sig Dispense Refill   amLODipine (NORVASC) 10 MG tablet TAKE 1 TABLET(10 MG) BY MOUTH DAILY 90 tablet 0   apixaban (ELIQUIS) 5 MG TABS tablet Take 1 tablet (5 mg total) by mouth 2 (two) times daily. 60 tablet 11   atorvastatin (LIPITOR) 80 MG tablet Take 1 tablet (80 mg total) by mouth daily. 100 tablet 2   Blood Glucose Monitoring Suppl (ACCU-CHEK GUIDE) w/Device KIT Check blood sugar 3 times per day. 1 kit 1   carvedilol (COREG) 12.5 MG tablet TAKE 1 TABLET(12.5 MG) BY MOUTH TWICE DAILY WITH A MEAL 180 tablet 0   empagliflozin (JARDIANCE) 25 MG TABS tablet Take 1 tablet (25 mg total) by mouth daily before breakfast. 30 tablet 11   Evolocumab (REPATHA SURECLICK) 140 MG/ML SOAJ Inject 140 mg into the skin every 14 (fourteen) days. 6 mL 3   ezetimibe (ZETIA) 10 MG tablet Take 1 tablet (10 mg total) by mouth daily. 90 tablet 1   glucose blood (ONETOUCH VERIO) test strip Check blood sugar up to 3 times a day. 300 each 3   insulin degludec (TRESIBA) 100 UNIT/ML FlexTouch Pen Inject 8 Units into the skin daily. 9 mL 0   Insulin Pen Needle (PEN NEEDLES) 32G X 4 MM MISC 1 Needle by Does not apply route daily. 200 each 1   Lancets (ONETOUCH DELICA PLUS LANCET33G) MISC Check blood sugar up to 3 times a day 300 each 3   losartan-hydrochlorothiazide (HYZAAR) 100-25 MG tablet Take 1 tablet by mouth daily. 100 tablet 2   metFORMIN (GLUCOPHAGE) 1000 MG tablet Take 1 tablet (1,000 mg total) by mouth 2 (two) times daily with a meal. 200 tablet 2    spironolactone (ALDACTONE) 25 MG tablet Take 1 tablet (25 mg total) by mouth daily. 30 tablet 11   TRULICITY 0.75 MG/0.5ML SOPN ADMINISTER 0.75 MG UNDER THE SKIN 1 TIME A WEEK 2 mL 2   No current facility-administered medications on file prior to visit.    Family History  Problem Relation Age of Onset   Diabetes Mother    Diabetes Sister     Social History   Socioeconomic History   Marital status: Single    Spouse name: Not on file   Number of children: 3   Years of education: Not on file   Highest education level: Some college, no degree  Occupational History   Occupation: Retired  Tobacco Use   Smoking status: Never    Passive exposure: Never   Smokeless tobacco: Never  Substance and Sexual Activity   Alcohol use: No   Drug use: Never   Sexual activity: Not on file  Other Topics Concern   Not on file  Social History Narrative   Lives w daughter   Left handed   Caffeine: rarely, mainly juice      _____________________________________________         Breakfast - oatmeal with sugar, fruit, coffee or tea   Lunch - usually no lunch   Dinner - chicken  or fish or [place], vegetables with broccoli and cheese, green beans, tomatoes, corn, butter, sometimes rice   Snacks - fruit   Drinks - water or tea, juice   Exercise - walking most days, 2.5 miles      Social Determinants of Health   Financial Resource Strain: Low Risk  (05/12/2022)   Overall Financial Resource Strain (CARDIA)    Difficulty of Paying Living Expenses: Not very hard  Food Insecurity: Low Risk  (05/14/2023)   Received from [facility]   Food vital sign    Within the past 12 months, you worried that your food would run out before you got money to buy more: Never true    Within the past 12 months, the food you bought just didn't last and you didn't have money to get more. : Never true  Transportation Needs: Not on file (05/14/2023)  Physical Activity: Inactive (11/27/2022)   Exercise Vital Sign    Days of  Exercise per Week: 0 days    Minutes of Exercise per Session: 0 min  Stress: No Stress Concern Present (05/12/2022)   [employer] of Occupational Health - Occupational Stress Questionnaire    Feeling of Stress : Not at all  Social Connections: Moderately Integrated (05/12/2022)   Social Connection and Isolation Panel [NHANES]    Frequency of Communication with Friends and Family: Three times a week    Frequency of Social Gatherings with Friends and Family: Once a week    Attends Religious Services: More than 4 times per year    Active Member of [employer] or Organizations: Yes    Attends Banker Meetings: Never    Marital Status: Widowed  Intimate Partner Violence: Not At Risk (05/12/2022)   Humiliation, Afraid, Rape, and Kick questionnaire    Fear of Current or Ex-Partner: No    Emotionally Abused: No    Physically Abused: No    Sexually Abused: No    Review of Systems: ROS negative except for what is noted on the assessment and plan.  Vitals:   06/07/23 1336 06/07/23 1403  BP: (!) 169/80 (!) 173/78  Pulse: 79 69  Temp: 98.2 F (36.8 C)   TempSrc: Oral   SpO2: 100%   Weight: 175 lb 9.6 oz (79.7 kg)     Physical Exam: Constitutional: well-appearing, in no acute distress HENT: normocephalic atraumatic, mucous membranes moist Eyes: conjunctiva non-erythematous Neck: supple Cardiovascular: regular rate and rhythm, no m/r/g Pulmonary/Chest: normal work of breathing on room air, lungs clear to auscultation bilaterally Abdominal: soft, non-tender, non-distended MSK: normal bulk and tone Neurological: alert & oriented x 3, 5/5 strength in bilateral upper and lower extremities, normal gait Skin: warm and dr  Assessment & Plan:   Resistant hypertension Patient was hospitalized on 05/14/23 for concern for syncope and had some slurred speech after the episode. 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 The hospital team was concerned that hypotension  may have contributed to her presenting symptoms. Orthostatics in the hospital noted 144/71 then drop with sitting to 120/73 and standing was 126/77. Because of this concern, her losartan-hydrochlorothiazide was discontinued. At her neurology follow-up on 8/27, her BP was 183/103, so she was restarted on her amlodipine 5 mg daily. Today in clinic, her blood pressure was elevated at 173/78. We encouraged her to continue to record her BP at home and message [place] through MyChart to trend BP after medication adjustment. - F/U in 3 months  - F/U in 2 weeks for  nurse visit to check BP  - Begin Losartan 100 mg once daily  Atrial fibrillation [facility]) Patient was newly diagnosed with atrial fibrillation by cardiologist on 04/2022 year. Denies chest pain, SOB, palpitations, or other signs or symptoms.  - Continue apixaban (Eliquis) 5mg    Hyperlipidemia On 03/12/23, total cholesterol 254, HDL 32, LDL 169, triglycerides 277.  - Continue medication regimen    Health Maintenance Patient noted to be scheduled for colonoscopy on 06/15/23. She also received her flu vaccination and received her foot exam.   Patient seen with Dr. Susa Raring, MD  [facility] Internal Medicine, PGY-1 Date 06/07/2023 Time 2:33 PM

## 2023-06-07 NOTE — Assessment & Plan Note (Signed)
Patient was hospitalized on 05/14/23 for concern for syncope and had some slurred speech after the episode. 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 The hospital team was concerned that hypotension may have contributed to her presenting symptoms. Orthostatics in the hospital noted 144/71 then drop with sitting to 120/73 and standing was 126/77. Because of this concern, her losartan-hydrochlorothiazide was discontinued. At her neurology follow-up on 8/27, her BP was 183/103, so she was restarted on her amlodipine 5 mg daily. Today in clinic, her blood pressure was elevated at 173/78. We encouraged her to continue to record her BP at home and message [place] through MyChart to trend BP after medication adjustment. - F/U in 3 months  - F/U in 2 weeks for nurse visit to check BP  - Begin Losartan 100 mg once daily

## 2023-06-07 NOTE — Assessment & Plan Note (Signed)
Patient was newly diagnosed with atrial fibrillation by cardiologist on 04/2022 year. Denies chest pain, SOB, palpitations, or other signs or symptoms.  - Continue apixaban (Eliquis) 5mg 

## 2023-06-07 NOTE — Progress Notes (Signed)
Subjective:   Cheyenne Valenzuela is a 67 y.o. female who presents for Medicare Annual (Subsequent) preventive examination.  Visit Complete: In person  Patient Medicare AWV questionnaire was completed by the patient on 06/07/2023; I have confirmed that all information answered by patient is correct and no changes since this date.  Review of Systems    Defer to pcp       Objective:    Today's Vitals   06/07/23 1336 06/07/23 1403  BP: (!) 169/80 (!) 173/78  Pulse: 79 69  Weight: 175 lb 11.3 oz (79.7 kg)    Body mass index is 31.13 kg/m.     06/07/2023    5:05 PM 06/07/2023    3:21 PM 06/07/2023    1:37 PM 03/12/2023    3:27 PM 12/19/2022    3:01 PM 08/18/2022   11:05 AM 08/16/2022    4:59 PM  Advanced Directives  Does Patient Have a Medical Advance Directive? No No No No No No No  Would patient like information on creating a medical advance directive? No - Patient declined No - Patient declined No - Patient declined No - Patient declined No - Patient declined Yes (MAU/Ambulatory/Procedural Areas - Information given) No - Patient declined    Current Medications (verified) Outpatient Encounter Medications as of 06/07/2023  Medication Sig   amLODipine (NORVASC) 10 MG tablet TAKE 1 TABLET(10 MG) BY MOUTH DAILY   apixaban (ELIQUIS) 5 MG TABS tablet Take 1 tablet (5 mg total) by mouth 2 (two) times daily.   atorvastatin (LIPITOR) 80 MG tablet Take 1 tablet (80 mg total) by mouth daily.   Blood Glucose Monitoring Suppl (ACCU-CHEK GUIDE) w/Device KIT Check blood sugar 3 times per day.   carvedilol (COREG) 12.5 MG tablet TAKE 1 TABLET(12.5 MG) BY MOUTH TWICE DAILY WITH A MEAL   empagliflozin (JARDIANCE) 25 MG TABS tablet Take 1 tablet (25 mg total) by mouth daily before breakfast.   Evolocumab (REPATHA SURECLICK) 140 MG/ML SOAJ Inject 140 mg into the skin every 14 (fourteen) days.   ezetimibe (ZETIA) 10 MG tablet Take 1 tablet (10 mg total) by mouth daily.   glucose blood (ONETOUCH VERIO)  test strip Check blood sugar up to 3 times a day.   insulin degludec (TRESIBA) 100 UNIT/ML FlexTouch Pen Inject 8 Units into the skin daily.   Insulin Pen Needle (PEN NEEDLES) 32G X 4 MM MISC 1 Needle by Does not apply route daily.   Lancets (ONETOUCH DELICA PLUS LANCET33G) MISC Check blood sugar up to 3 times a day   losartan (COZAAR) 100 MG tablet Take 1 tablet (100 mg total) by mouth daily.   losartan-hydrochlorothiazide (HYZAAR) 100-25 MG tablet Take 1 tablet by mouth daily.   metFORMIN (GLUCOPHAGE) 1000 MG tablet Take 1 tablet (1,000 mg total) by mouth 2 (two) times daily with a meal.   spironolactone (ALDACTONE) 25 MG tablet Take 1 tablet (25 mg total) by mouth daily.   TRULICITY 0.75 MG/0.5ML SOPN ADMINISTER 0.75 MG UNDER THE SKIN 1 TIME A WEEK   No facility-administered encounter medications on file as of 06/07/2023.    Allergies (verified) Lisinopril   History: Past Medical History:  Diagnosis Date   Cough 06/24/2021   Hypercholesteremia    Hypertension    Influenza A 11/21/2021   Stroke [facility])    Past Surgical History:  Procedure Laterality Date   ABDOMINAL HYSTERECTOMY     APPENDECTOMY     Family History  Problem Relation Age of Onset   Diabetes  Mother    Diabetes Sister    Social History   Socioeconomic History   Marital status: Single    Spouse name: Not on file   Number of children: 3   Years of education: Not on file   Highest education level: Some college, no degree  Occupational History   Occupation: Retired  Tobacco Use   Smoking status: Never    Passive exposure: Never   Smokeless tobacco: Never  Substance and Sexual Activity   Alcohol use: No   Drug use: Never   Sexual activity: Not on file  Other Topics Concern   Not on file  Social History Narrative   Lives w daughter   Left handed   Caffeine: rarely, mainly juice      _____________________________________________         Breakfast - oatmeal with sugar, fruit, coffee or tea   Lunch -  usually no lunch   Dinner - chicken or fish or [place], vegetables with broccoli and cheese, green beans, tomatoes, corn, butter, sometimes rice   Snacks - fruit   Drinks - water or tea, juice   Exercise - walking most days, 2.5 miles      Social Determinants of Health   Financial Resource Strain: Low Risk  (05/12/2022)   Overall Financial Resource Strain (CARDIA)    Difficulty of Paying Living Expenses: Not very hard  Food Insecurity: Low Risk  (05/14/2023)   Received from [facility]   Food vital sign    Within the past 12 months, you worried that your food would run out before you got money to buy more: Never true    Within the past 12 months, the food you bought just didn't last and you didn't have money to get more. : Never true  Transportation Needs: Not on file (05/14/2023)  Physical Activity: Inactive (11/27/2022)   Exercise Vital Sign    Days of Exercise per Week: 0 days    Minutes of Exercise per Session: 0 min  Stress: No Stress Concern Present (05/12/2022)   [employer] of Occupational Health - Occupational Stress Questionnaire    Feeling of Stress : Not at all  Social Connections: Moderately Integrated (05/12/2022)   Social Connection and Isolation Panel [NHANES]    Frequency of Communication with Friends and Family: Three times a week    Frequency of Social Gatherings with Friends and Family: Once a week    Attends Religious Services: More than 4 times per year    Active Member of [employer] or Organizations: Yes    Attends Banker Meetings: Never    Marital Status: Widowed    Tobacco Counseling Counseling given: Not Answered   Clinical Intake:  Pre-visit preparation completed: Yes  Pain : Faces Faces Pain Scale: No hurt  Faces Pain Scale: No hurt  Diabetes: Yes  How often do you need to have someone help you when you read instructions, pamphlets, or other written materials from your doctor or pharmacy?: 1 - Never  Interpreter Needed?:  No  Information entered by :: [email or web address]   Activities of Daily Living    06/07/2023    1:38 PM 03/12/2023    3:28 PM  In your present state of health, do you have any difficulty performing the following activities:  Hearing? 0 0  Vision? 0 0  Difficulty concentrating or making decisions? 0 0  Walking or climbing stairs? 0 0  Dressing or bathing? 0 0  Doing errands, shopping? 0 0  Patient Care Team: Carmina Miller, DO as PCP - General Lanier Prude, MD as PCP - Electrophysiology (Cardiology)  Indicate any recent Medical Services you may have received from other than [facility] providers in the past year (date may be approximate).     Assessment:   This is a routine wellness examination for Evetta.  Hearing/Vision screen No results found.  Dietary issues and exercise activities discussed:     Goals Addressed   None   Depression Screen    06/07/2023    5:03 PM 03/12/2023    3:28 PM 11/27/2022    3:06 PM 08/18/2022   11:42 AM 08/15/2022    3:26 PM 05/12/2022   10:51 AM 05/12/2022    9:06 AM  PHQ 2/9 Scores  PHQ - 2 Score 0 0 0 1 0 0 0  PHQ- 9 Score   3 8 3 1      Fall Risk    06/07/2023    5:03 PM 06/07/2023    1:38 PM 03/12/2023    3:28 PM 12/19/2022    3:00 PM 12/19/2022    2:59 PM  Fall Risk   Falls in the past year? 0 0 0  1  Number falls in past yr: 0 0 0  0  Comment     After Stroke  Injury with Fall? 0 0 0  0  Risk for fall due to : No Fall Risks    Impaired balance/gait  Follow up Falls evaluation completed Falls evaluation completed Falls evaluation completed Falls evaluation completed;Falls prevention discussed     MEDICARE RISK AT HOME:    TIMED UP AND GO:  Was the test performed?  No    Cognitive Function:        06/07/2023    5:03 PM 05/12/2022   11:41 AM  6CIT Screen  What Year? 0 points 0 points  What month? 0 points 0 points  What time? 0 points 0 points  Count back from 20 0 points 0 points  Months in reverse 0 points 0 points  Repeat  phrase 0 points 0 points  Total Score 0 points 0 points    Immunizations Immunization History  Administered Date(s) Administered   Fluad Quad(high Dose 65+) 09/20/2022   Influenza, Seasonal, Injecte, Preservative Fre 06/07/2023   Influenza-Unspecified 06/25/2008, 07/13/2011, 07/05/2012, 07/09/2013, 07/15/2014, 07/06/2015   PNEUMOCOCCAL CONJUGATE-20 12/12/2021   PPD Test 07/04/2010, 06/27/2011, 08/10/2020   Tdap 03/25/2009, 09/20/2010    TDAP status: Due, Education has been provided regarding the importance of this vaccine. Advised may receive this vaccine at local pharmacy or Health Dept. Aware to provide a copy of the vaccination record if obtained from local pharmacy or Health Dept. Verbalized acceptance and understanding.  Flu Vaccine status: Completed at today's visit  Pneumococcal vaccine status: Up to date  Covid-19 vaccine status: Information provided on how to obtain vaccines.   Qualifies for Shingles Vaccine? Yes   Zostavax completed No   Shingrix Completed?: No.    Education has been provided regarding the importance of this vaccine. Patient has been advised to call insurance company to determine out of pocket expense if they have not yet received this vaccine. Advised may also receive vaccine at local pharmacy or Health Dept. Verbalized acceptance and understanding.  Screening Tests Health Maintenance  Topic Date Due   Colonoscopy  Never done   Zoster Vaccines- Shingrix (1 of 2) Never done   DTaP/Tdap/Td (3 - Td or Tdap) 09/20/2020   COVID-19 Vaccine (1 - 2023-24  season) Never done   COLON CANCER SCREENING ANNUAL FOBT  01/10/2023   OPHTHALMOLOGY EXAM  05/20/2023   HEMOGLOBIN A1C  06/12/2023   Diabetic kidney evaluation - Urine ACR  09/06/2023   Diabetic kidney evaluation - eGFR measurement  12/19/2023   FOOT EXAM  06/06/2024   Medicare Annual Wellness (AWV)  06/06/2024   MAMMOGRAM  03/14/2025   Pneumonia Vaccine 82+ Years old  Completed   INFLUENZA VACCINE   Completed   DEXA SCAN  Completed   Hepatitis C Screening  Completed   HPV VACCINES  Aged Out    Health Maintenance  Health Maintenance Due  Topic Date Due   Colonoscopy  Never done   Zoster Vaccines- Shingrix (1 of 2) Never done   DTaP/Tdap/Td (3 - Td or Tdap) 09/20/2020   COVID-19 Vaccine (1 - 2023-24 season) Never done   COLON CANCER SCREENING ANNUAL FOBT  01/10/2023   OPHTHALMOLOGY EXAM  05/20/2023    Colorectal cancer screening: Type of screening: Colonoscopy.Scheduled for Sept 6th   Mammogram status: Completed 09/18/2023. Repeat every year 49yrs  Bone Density status: Completed 02/13/2023.   Lung Cancer Screening: (Low Dose CT Chest recommended if Age 18-80 years, 20 pack-year currently smoking OR have quit w/in 15years.) does not qualify.   Lung Cancer Screening Referral: n/a  Additional Screening:  Hepatitis C Screening: does not qualify; Completed 05/12/2022  Vision Screening: Recommended annual ophthalmology exams for early detection of glaucoma and other disorders of the eye. Is the patient up to date with their annual eye exam?  Yes  Who is the provider or what is the name of the office in which the patient attends annual eye exams? DrRadio Chinko If pt is not established with a provider, would they like to be referred to a provider to establish care?  N/a .   Dental Screening: Recommended annual dental exams for proper oral hygiene  Diabetic Foot Exam: Diabetic Foot Exam: Completed 06/07/2023  Community Resource Referral / Chronic Care Management: CRR required this visit?  No   CCM required this visit?  No     Plan:     I have personally reviewed and noted the following in the patient's chart:   Medical and social history Use of alcohol, tobacco or illicit drugs  Current medications and supplements including opioid prescriptions. Patient is not currently taking opioid prescriptions. Functional ability and status Nutritional status Physical  activity Advanced directives List of other physicians Hospitalizations, surgeries, and ER visits in previous 12 months Vitals Screenings to include cognitive, depression, and falls Referrals and appointments  In addition, I have reviewed and discussed with patient certain preventive protocols, quality metrics, and best practice recommendations. A written personalized care plan for preventive services as well as general preventive health recommendations were provided to patient.     Joslyn Devon, [place]   06/07/2023   After Visit Summary: printed   Nurse Notes: face to face  Ms. Fawbush , Thank you for taking time to come for your Medicare Wellness Visit. I appreciate your ongoing commitment to your health goals. Please review the following plan we discussed and let me know if I can assist you in the future.   These are the goals we discussed:  Goals      Exercise 3x per week (30 min per time)        This is a list of the screening recommended for you and due dates:  Health Maintenance  Topic Date Due   Colon Cancer  Screening  Never done   Zoster (Shingles) Vaccine (1 of 2) Never done   DTaP/Tdap/Td vaccine (3 - Td or Tdap) 09/20/2020   COVID-19 Vaccine (1 - 2023-24 season) Never done   Stool Blood Test  01/10/2023   Eye exam for diabetics  05/20/2023   Hemoglobin A1C  06/12/2023   Yearly kidney health urinalysis for diabetes  09/06/2023   Yearly kidney function blood test for diabetes  12/19/2023   Complete foot exam   06/06/2024   Medicare Annual Wellness Visit  06/06/2024   Mammogram  03/14/2025   Pneumonia Vaccine  Completed   Flu Shot  Completed   DEXA scan (bone density measurement)  Completed   Hepatitis C Screening  Completed   HPV Vaccine  Aged Out

## 2023-06-12 ENCOUNTER — Telehealth: Payer: Self-pay | Admitting: Dietician

## 2023-06-12 NOTE — Telephone Encounter (Signed)
To follow up on Diabetes Self Management Education & Support

## 2023-06-13 NOTE — Progress Notes (Signed)
 Internal Medicine Clinic Attending  I was physically present during the key portions of the resident provided service and participated in the medical decision making of patient's management care. I reviewed pertinent patient test results.  The assessment, diagnosis, and plan were formulated together and I agree with the documentation in the resident's note.  Mercie Eon, MD

## 2023-06-14 ENCOUNTER — Telehealth: Payer: Self-pay | Admitting: Pharmacy Technician

## 2023-06-14 ENCOUNTER — Telehealth: Payer: Self-pay | Admitting: Pharmacist

## 2023-06-14 ENCOUNTER — Other Ambulatory Visit (HOSPITAL_COMMUNITY): Payer: Self-pay

## 2023-06-14 DIAGNOSIS — E782 Mixed hyperlipidemia: Secondary | ICD-10-CM

## 2023-06-14 NOTE — Telephone Encounter (Signed)
Last Lipid lab discussed with patient. LDLc still above goal > 98. Currently on Repatha 140 mg Q14d, Lipitor 80 mg and Zetia 10 mg daily.   Discussed addition of nexletol. Discussed side effects, monitoring and cost.  Will assess PA requirement for Nexlizet.

## 2023-06-14 NOTE — Telephone Encounter (Signed)
Pharmacy Patient Advocate Encounter   Received notification from Pt Calls Messages that prior authorization for nexletol is required/requested.   Insurance verification completed.   The patient is insured through [employer].   Per test claim: PA required; PA submitted to [employer] via CoverMyMeds Key/confirmation #/EOC [id number] Status is pending

## 2023-06-14 NOTE — Telephone Encounter (Signed)
Spoke to patient about approval, ordered the uric acid lab for tomorrow. If WNL will send prescription for Nexlizet to her preferred pharmacy on Monday.

## 2023-06-14 NOTE — Telephone Encounter (Signed)
Pa required and PA initiated

## 2023-06-14 NOTE — Telephone Encounter (Signed)
Pharmacy Patient Advocate Encounter  Received notification from [employer] that Prior Authorization for nexletol has been APPROVED from 05/15/23 to 06/13/24. Ran test claim, Copay is $11.20. This test claim was processed through [facility]- copay amounts may vary at other pharmacies due to pharmacy/plan contracts, or as the patient moves through the different stages of their insurance plan.   PA #/Case ID/Reference #: [id number]

## 2023-06-14 NOTE — Addendum Note (Signed)
Addended by: Tylene Fantasia on: 06/14/2023 04:48 PM   Modules accepted: Orders

## 2023-06-15 ENCOUNTER — Ambulatory Visit: Payer: Medicare (Managed Care) | Admitting: Gastroenterology

## 2023-06-15 DIAGNOSIS — E782 Mixed hyperlipidemia: Secondary | ICD-10-CM | POA: Diagnosis not present

## 2023-06-16 LAB — URIC ACID: Uric Acid: 6 mg/dL (ref 3.0–7.2)

## 2023-06-18 MED ORDER — NEXLETOL 180 MG PO TABS: 180.0000 mg | ORAL_TABLET | Freq: Every day | ORAL | 11 refills | Status: DC

## 2023-06-18 NOTE — Telephone Encounter (Signed)
Spoke to patient, will optimize lipid therapy by adding bempedoic acid to statin, Zetia and PCSK9i.  Will re-check uric acid in 1 month and lipid lab is due in 3 months.  If Nexletol tolerated well in a month may switch to combination - Nexlizet to reduce pill burden.

## 2023-06-18 NOTE — Telephone Encounter (Signed)
Call to discuss uric acid lab. N/A LVM

## 2023-06-18 NOTE — Addendum Note (Signed)
Addended by: Tylene Fantasia on: 06/18/2023 10:52 AM   Modules accepted: Orders

## 2023-06-20 NOTE — Progress Notes (Signed)
I reviewed the AWV findings with the provider who conducted the visit. I was present in the office suite and immediately available to provide assistance and direction throughout the time the service was provided.  

## 2023-06-21 ENCOUNTER — Ambulatory Visit: Payer: Medicare (Managed Care) | Admitting: *Deleted

## 2023-06-21 ENCOUNTER — Ambulatory Visit: Payer: Medicare (Managed Care) | Admitting: Dietician

## 2023-06-21 DIAGNOSIS — E119 Type 2 diabetes mellitus without complications: Secondary | ICD-10-CM

## 2023-06-21 DIAGNOSIS — E1169 Type 2 diabetes mellitus with other specified complication: Secondary | ICD-10-CM

## 2023-06-21 DIAGNOSIS — Z794 Long term (current) use of insulin: Secondary | ICD-10-CM | POA: Diagnosis not present

## 2023-06-21 NOTE — Progress Notes (Addendum)
Diabetes Self-Management Education  Visit Type: 6 Month Follow-Up  Appt. Start Time: 1450 Appt. End Time: 1530  06/21/2023  Ms. Frederik Schmidt, identified by name and date of birth, is a 67 y.o. female with a diagnosis of Diabetes:  .   ASSESSMENT  Taking Evaristo Bury 4 units daily  that is not on her medication list. Stopped metformin because it makes her feel bad. On Trulicity but only the 0.75 mg dose. Discussed talking to her doctor about increasing the truclity and that this might enable her to stop the insulin.   She is happy to have regained her weight and doesn't want to loose it, so this was discussed with her today that it  could limit her willingness to increase the GLP-1. However, it may help her lower her blood pressure.  She is also upset that the neurologist took her off hydrochlorothiazide to help clear up her blurry vision and it was helping and she says our doctor put her back on it. She would like to move more but says her blurry vision is limiting this. She is interested in a PREP referral   Diabetes Self-Management Education - 06/21/23 1400       Visit Information   Visit Type 6 Month Follow-Up      Health Coping   How would you rate your overall health? Good   vision is still blurry, frustrated about blood pressure and blood pressure medicines     Individualized Goals (developed by patient)   Monitoring  Consistenly use CGM      Subsequent Visit   Since your last visit have you continued or begun to take your medications as prescribed? No    Since your last visit have you had your blood pressure checked? Yes    Is your most recent blood pressure lower, unchanged, or higher since your last visit? Unchanged    Since your last visit have you experienced any weight changes? Gain    Weight Gain (lbs) 14    Since your last visit, are you checking your blood glucose at least once a day? Yes             Individualized Plan for Diabetes Self-Management Training:    Learning Objective:  Patient will have a greater understanding of diabetes self-management. Patient education plan is to attend individual and/or group sessions per assessed needs and concerns.   Plan:   Patient Instructions  You are doing great!   Suggest asking about increasing the Trulcity.   We talked about using your bodyweight to exercise on the floor until your vision clears.    I will ask about a referral for the exercise .  I suggest checking once a year. May of 2025 .   Lupita Leash [phone]   Expected Outcomes:     Education material provided: No sodium seasonings and Diabetes Resources  If problems or questions, patient to contact team via:  Phone and Email  Future DSME appointment:  1 year

## 2023-06-21 NOTE — Progress Notes (Addendum)
    Cheyenne Valenzuela presented today for blood pressure check. Patient is prescribed blood pressure medications and I confirmed that patient did take their blood pressure medication prior to today's appointment. Blood pressure was taken in the usual and appropriate manner using an automated BP cuff.     Vitals:   06/21/23 1437 06/21/23 1443  BP: (!) 150/91 (!) 157/84   Pt states she is taking Losartan and Losartan-hydrochlorothiazide. I informed pt, per Dr Rayvon Char' office note, Losartan-hydrochlorothiazide was discontinued.   Results of today's visit will be routed to Dr. Rayvon Char for review and further management.

## 2023-06-21 NOTE — Patient Instructions (Signed)
You are doing great!   Suggest asking about increasing the Trulcity.   We talked about using your bodyweight to exercise on the floor until your vision clears.    I will ask about a referral for the exercise .  I suggest checking once a year. May of 2025 .   Lupita Leash [phone]

## 2023-06-25 ENCOUNTER — Ambulatory Visit: Payer: Medicare (Managed Care)

## 2023-07-04 ENCOUNTER — Ambulatory Visit: Payer: Medicare (Managed Care)

## 2023-07-04 VITALS — Ht 63.5 in | Wt 175.0 lb

## 2023-07-04 DIAGNOSIS — Z0000 Encounter for general adult medical examination without abnormal findings: Secondary | ICD-10-CM

## 2023-07-04 NOTE — Patient Instructions (Signed)
[name] , Thank you for taking time to come for your Medicare Wellness Visit. I appreciate your ongoing commitment to your health goals. Please review the following plan we discussed and let me know if I can assist you in the future.   Referrals/Orders/Follow-Ups/Clinician Recommendations: You are due for a Tetanus vaccine and a Shingles vaccine, which can be done at your local pharmacy.  It was nice to speak with you today.  Keep up the good work.  This is a list of the screening recommended for you and due dates:  Health Maintenance  Topic Date Due   Colon Cancer Screening  Never done   Zoster (Shingles) Vaccine (1 of 2) Never done   DTaP/Tdap/Td vaccine (3 - Td or Tdap) 09/20/2020   Stool Blood Test  01/10/2023   Eye exam for diabetics  05/20/2023   COVID-19 Vaccine (1 - 2023-24 season) Never done   Hemoglobin A1C  06/12/2023   Yearly kidney health urinalysis for diabetes  09/06/2023   Yearly kidney function blood test for diabetes  12/19/2023   Complete foot exam   06/06/2024   Medicare Annual Wellness Visit  07/03/2024   Mammogram  03/14/2025   Pneumonia Vaccine  Completed   Flu Shot  Completed   DEXA scan (bone density measurement)  Completed   Hepatitis C Screening  Completed   HPV Vaccine  Aged Out    Advanced directives: (Copy Requested) Please bring a copy of your health care power of attorney and living will to the office to be added to your chart at your convenience.  Next Medicare Annual Wellness Visit scheduled for next year: Yes

## 2023-07-04 NOTE — Progress Notes (Signed)
Subjective:   Cheyenne Valenzuela is a 67 y.o. female who presents for Medicare Annual (Subsequent) preventive examination.  Visit Complete: Virtual  I connected with  Frederik Schmidt on 07/04/23 by a audio enabled telemedicine application and verified that I am speaking with the correct person using two identifiers.  Patient Location: Home  Provider Location: Home Office  I discussed the limitations of evaluation and management by telemedicine. The patient expressed understanding and agreed to proceed.  Because this visit was a virtual/telehealth visit, some criteria may be missing or patient reported. Any vitals not documented were not able to be obtained and vitals that have been documented are patient reported.    Cardiac Risk Factors include: advanced age (>27men, >61 women);hypertension;diabetes mellitus;dyslipidemia     Objective:    Today's Vitals   07/04/23 1308  Weight: 175 lb (79.4 kg)  Height: 5' 3.5" (1.613 m)   Body mass index is 30.51 kg/m.     07/04/2023    1:13 PM 06/07/2023    5:05 PM 06/07/2023    3:21 PM 06/07/2023    1:37 PM 03/12/2023    3:27 PM 12/19/2022    3:01 PM 08/18/2022   11:05 AM  Advanced Directives  Does Patient Have a Medical Advance Directive? No No No No No No No  Would patient like information on creating a medical advance directive?  No - Patient declined No - Patient declined No - Patient declined No - Patient declined No - Patient declined Yes (MAU/Ambulatory/Procedural Areas - Information given)    Current Medications (verified) Outpatient Encounter Medications as of 07/04/2023  Medication Sig   amLODipine (NORVASC) 10 MG tablet TAKE 1 TABLET(10 MG) BY MOUTH DAILY   apixaban (ELIQUIS) 5 MG TABS tablet Take 1 tablet (5 mg total) by mouth 2 (two) times daily.   atorvastatin (LIPITOR) 80 MG tablet Take 1 tablet (80 mg total) by mouth daily.   Bempedoic Acid (NEXLETOL) 180 MG TABS Take 1 tablet (180 mg total) by mouth daily.   Blood Glucose  Monitoring Suppl (ACCU-CHEK GUIDE) w/Device KIT Check blood sugar 3 times per day.   carvedilol (COREG) 12.5 MG tablet TAKE 1 TABLET(12.5 MG) BY MOUTH TWICE DAILY WITH A MEAL   empagliflozin (JARDIANCE) 25 MG TABS tablet Take 1 tablet (25 mg total) by mouth daily before breakfast.   Evolocumab (REPATHA SURECLICK) 140 MG/ML SOAJ Inject 140 mg into the skin every 14 (fourteen) days.   ezetimibe (ZETIA) 10 MG tablet Take 1 tablet (10 mg total) by mouth daily.   glucose blood (ONETOUCH VERIO) test strip Check blood sugar up to 3 times a day.   Insulin Pen Needle (PEN NEEDLES) 32G X 4 MM MISC 1 Needle by Does not apply route daily.   Lancets (ONETOUCH DELICA PLUS LANCET33G) MISC Check blood sugar up to 3 times a day   losartan (COZAAR) 100 MG tablet Take 1 tablet (100 mg total) by mouth daily.   losartan-hydrochlorothiazide (HYZAAR) 100-25 MG tablet Take 1 tablet by mouth daily.   metFORMIN (GLUCOPHAGE) 1000 MG tablet Take 1 tablet (1,000 mg total) by mouth 2 (two) times daily with a meal.   spironolactone (ALDACTONE) 25 MG tablet Take 1 tablet (25 mg total) by mouth daily.   TRULICITY 0.75 MG/0.5ML SOPN ADMINISTER 0.75 MG UNDER THE SKIN 1 TIME A WEEK   No facility-administered encounter medications on file as of 07/04/2023.    Allergies (verified) Lisinopril   History: Past Medical History:  Diagnosis Date   Cough 06/24/2021  Hypercholesteremia    Hypertension    Influenza A 11/21/2021   Stroke [facility])    Past Surgical History:  Procedure Laterality Date   ABDOMINAL HYSTERECTOMY     APPENDECTOMY     Family History  Problem Relation Age of Onset   Diabetes Mother    Diabetes Sister    Social History   Socioeconomic History   Marital status: Single    Spouse name: Not on file   Number of children: 3   Years of education: Not on file   Highest education level: Some college, no degree  Occupational History   Occupation: Retired  Tobacco Use   Smoking status: Never    Passive  exposure: Never   Smokeless tobacco: Never  Vaping Use   Vaping status: Not on file  Substance and Sexual Activity   Alcohol use: No   Drug use: Never   Sexual activity: Not on file  Other Topics Concern   Not on file  Social History Narrative   Lives w daughter   Left handed   Caffeine: rarely, mainly juice      _____________________________________________         Breakfast - oatmeal with sugar, fruit, coffee or tea   Lunch - usually no lunch   Dinner - chicken or fish or [place], vegetables with broccoli and cheese, green beans, tomatoes, corn, butter, sometimes rice   Snacks - fruit   Drinks - water or tea, juice   Exercise - walking most days, 2.5 miles      Social Determinants of Health   Financial Resource Strain: Low Risk  (07/04/2023)   Overall Financial Resource Strain (CARDIA)    Difficulty of Paying Living Expenses: Not very hard  Food Insecurity: No Food Insecurity (07/04/2023)   Hunger Vital Sign    Worried About Running Out of Food in the Last Year: Never true    Ran Out of Food in the Last Year: Never true  Transportation Needs: No Transportation Needs (07/04/2023)   PRAPARE - Administrator, Civil Service (Medical): No    Lack of Transportation (Non-Medical): No  Physical Activity: Insufficiently Active (07/04/2023)   Exercise Vital Sign    Days of Exercise per Week: 3 days    Minutes of Exercise per Session: 30 min  Stress: No Stress Concern Present (07/04/2023)   [employer] of Occupational Health - Occupational Stress Questionnaire    Feeling of Stress : Not at all  Social Connections: Moderately Integrated (07/04/2023)   Social Connection and Isolation Panel [NHANES]    Frequency of Communication with Friends and Family: More than three times a week    Frequency of Social Gatherings with Friends and Family: Once a week    Attends Religious Services: More than 4 times per year    Active Member of [employer] or Organizations: Yes    Attends  Banker Meetings: Never    Marital Status: Widowed    Tobacco Counseling Counseling given: Not Answered   Clinical Intake:  Pre-visit preparation completed: Yes  Pain : No/denies pain     BMI - recorded: 30.51 Nutritional Risks: None Diabetes: Yes CBG done?: No (160) Did pt. bring in CBG monitor from home?: No  How often do you need to have someone help you when you read instructions, pamphlets, or other written materials from your doctor or pharmacy?: 1 - Never  Interpreter Needed?: No  Information entered by :: Jamir Rone, RMA   Activities of Daily Living  07/04/2023    1:11 PM 06/07/2023    1:38 PM  In your present state of health, do you have any difficulty performing the following activities:  Hearing? 0 0  Vision? 0 0  Difficulty concentrating or making decisions? 0 0  Walking or climbing stairs? 0 0  Dressing or bathing? 0 0  Doing errands, shopping? 0 0  Preparing Food and eating ? N   Using the Toilet? N   In the past six months, have you accidently leaked urine? N   Do you have problems with loss of bowel control? N   Managing your Medications? N   Managing your Finances? N   Housekeeping or managing your Housekeeping? N     Patient Care Team: Carmina Miller, DO as PCP - General Lanier Prude, MD as PCP - Electrophysiology (Cardiology)  Indicate any recent Medical Services you may have received from other than [facility] providers in the past year (date may be approximate).     Assessment:   This is a routine wellness examination for Cheyenne Valenzuela.  Hearing/Vision screen Hearing Screening - Comments:: Denies hearing difficulties   Vision Screening - Comments:: Denies vision issues.   Goals Addressed             This Visit's Progress    Exercise 3x per week (30 min per time)   On track     Depression Screen    07/04/2023    1:17 PM 06/07/2023    5:03 PM 03/12/2023    3:28 PM 11/27/2022    3:06 PM 08/18/2022   11:42 AM  08/15/2022    3:26 PM 05/12/2022   10:51 AM  PHQ 2/9 Scores  PHQ - 2 Score 0 0 0 0 1 0 0  PHQ- 9 Score 1   3 8 3 1     Fall Risk    07/04/2023    1:14 PM 06/07/2023    5:03 PM 06/07/2023    1:38 PM 03/12/2023    3:28 PM 12/19/2022    3:00 PM  Fall Risk   Falls in the past year? 0 0 0 0   Number falls in past yr: 0 0 0 0   Injury with Fall? 0 0 0 0   Risk for fall due to : No Fall Risks No Fall Risks     Follow up Falls prevention discussed;Falls evaluation completed Falls evaluation completed Falls evaluation completed Falls evaluation completed Falls evaluation completed;Falls prevention discussed    MEDICARE RISK AT HOME: Medicare Risk at Home Any stairs in or around the home?: Yes If so, are there any without handrails?: Yes Home free of loose throw rugs in walkways, pet beds, electrical cords, etc?: Yes Adequate lighting in your home to reduce risk of falls?: Yes Life alert?: No Use of a cane, walker or w/c?: No Grab bars in the bathroom?: Yes Shower chair or bench in shower?: No Elevated toilet seat or a handicapped toilet?: No  TIMED UP AND GO:  Was the test performed?  No    Cognitive Function:        07/04/2023    1:15 PM 06/07/2023    5:03 PM 05/12/2022   11:41 AM  6CIT Screen  What Year? 0 points 0 points 0 points  What month? 0 points 0 points 0 points  What time? 0 points 0 points 0 points  Count back from 20 0 points 0 points 0 points  Months in reverse 0 points 0 points 0 points  Repeat phrase 0 points 0 points 0 points  Total Score 0 points 0 points 0 points    Immunizations Immunization History  Administered Date(s) Administered   Fluad Quad(high Dose 65+) 09/20/2022   Influenza, Seasonal, Injecte, Preservative Fre 06/07/2023   Influenza-Unspecified 06/25/2008, 07/13/2011, 07/05/2012, 07/09/2013, 07/15/2014, 07/06/2015   PNEUMOCOCCAL CONJUGATE-20 12/12/2021   PPD Test 07/04/2010, 06/27/2011, 08/10/2020   Tdap 03/25/2009, 09/20/2010    TDAP status:  Due, Education has been provided regarding the importance of this vaccine. Advised may receive this vaccine at local pharmacy or Health Dept. Aware to provide a copy of the vaccination record if obtained from local pharmacy or Health Dept. Verbalized acceptance and understanding.  Flu Vaccine status: Up to date  Pneumococcal vaccine status: Up to date  Covid-19 vaccine status: Information provided on how to obtain vaccines.   Qualifies for Shingles Vaccine? Yes   Zostavax completed No   Shingrix Completed?: No.    Education has been provided regarding the importance of this vaccine. Patient has been advised to call insurance company to determine out of pocket expense if they have not yet received this vaccine. Advised may also receive vaccine at local pharmacy or Health Dept. Verbalized acceptance and understanding.  Screening Tests Health Maintenance  Topic Date Due   Colonoscopy  Never done   Zoster Vaccines- Shingrix (1 of 2) Never done   DTaP/Tdap/Td (3 - Td or Tdap) 09/20/2020   COLON CANCER SCREENING ANNUAL FOBT  01/10/2023   OPHTHALMOLOGY EXAM  05/20/2023   COVID-19 Vaccine (1 - 2023-24 season) Never done   HEMOGLOBIN A1C  06/12/2023   Diabetic kidney evaluation - Urine ACR  09/06/2023   Diabetic kidney evaluation - eGFR measurement  12/19/2023   FOOT EXAM  06/06/2024   Medicare Annual Wellness (AWV)  07/03/2024   MAMMOGRAM  03/14/2025   Pneumonia Vaccine 27+ Years old  Completed   INFLUENZA VACCINE  Completed   DEXA SCAN  Completed   Hepatitis C Screening  Completed   HPV VACCINES  Aged Out    Health Maintenance  Health Maintenance Due  Topic Date Due   Colonoscopy  Never done   Zoster Vaccines- Shingrix (1 of 2) Never done   DTaP/Tdap/Td (3 - Td or Tdap) 09/20/2020   COLON CANCER SCREENING ANNUAL FOBT  01/10/2023   OPHTHALMOLOGY EXAM  05/20/2023   COVID-19 Vaccine (1 - 2023-24 season) Never done   HEMOGLOBIN A1C  06/12/2023    Colorectal cancer screening: Type  of screening: Cologuard. Completed 01/09/2022. Repeat every 3 years  Mammogram status: Completed 03/15/2023. Repeat every year  Bone Density status: Completed 02/13/2023. Results reflect: Bone density results: NORMAL. Repeat every 2 years.  Lung Cancer Screening: (Low Dose CT Chest recommended if Age 52-80 years, 20 pack-year currently smoking OR have quit w/in 15years.) does not qualify.   Lung Cancer Screening Referral: N/A  Additional Screening:  Hepatitis C Screening: does qualify; Completed 05/12/2022  Vision Screening: Recommended annual ophthalmology exams for early detection of glaucoma and other disorders of the eye. Is the patient up to date with their annual eye exam?  Yes  Who is the provider or what is the name of the office in which the patient attends annual eye exams? Dr. Arnetha Gula If pt is not established with a provider, would they like to be referred to a provider to establish care? No .   Dental Screening: Recommended annual dental exams for proper oral hygiene  Diabetic Foot Exam: Diabetic Foot Exam: Completed 06/07/2023  Community  Resource Referral / Chronic Care Management: CRR required this visit?  No   CCM required this visit?  No     Plan:     I have personally reviewed and noted the following in the patient's chart:   Medical and social history Use of alcohol, tobacco or illicit drugs  Current medications and supplements including opioid prescriptions. Patient is not currently taking opioid prescriptions. Functional ability and status Nutritional status Physical activity Advanced directives List of other physicians Hospitalizations, surgeries, and ER visits in previous 12 months Vitals Screenings to include cognitive, depression, and falls Referrals and appointments  In addition, I have reviewed and discussed with patient certain preventive protocols, quality metrics, and best practice recommendations. A written personalized care plan for  preventive services as well as general preventive health recommendations were provided to patient.     Camera Krienke L Refugio Mcconico, CMA   07/04/2023   After Visit Summary: (MyChart) Due to this being a telephonic visit, the after visit summary with patients personalized plan was offered to patient via MyChart   Nurse Notes: Patient is due for Tdap, Shingles and Covid vaccines.  She is aware that she can get these done at her local pharmacy.  Patient stated that she has had her annual eye exam for this year already and has another appointment with her eye doctor coming up.  Patient is also scheduled to have a colonoscopy soon.  I sent off a request for eye exam results today.  Patient had no other concerns to address today.

## 2023-07-12 ENCOUNTER — Telehealth: Payer: Self-pay | Admitting: Pharmacist

## 2023-07-12 NOTE — Telephone Encounter (Signed)
Call patient to remind for Uric acid lab, N/A LVM and sent MyChart

## 2023-07-19 ENCOUNTER — Telehealth: Payer: Self-pay | Admitting: Dietician

## 2023-07-19 NOTE — Telephone Encounter (Signed)
Called patient to suggest she consider scheduling an appointment for medication concerns with a doctor per Dr. Annie Paras. She verbalized understanding.

## 2023-07-25 NOTE — Telephone Encounter (Signed)
Spoke to pt, she will be going for FLP and Urica acid lab tomorrow.  Currently on statin,Zetia, nexletol and Repatha.

## 2023-07-26 DIAGNOSIS — E782 Mixed hyperlipidemia: Secondary | ICD-10-CM | POA: Diagnosis not present

## 2023-07-27 LAB — LIPID PANEL
Chol/HDL Ratio: 3.3 {ratio} (ref 0.0–4.4)
Cholesterol, Total: 124 mg/dL (ref 100–199)
HDL: 38 mg/dL — ABNORMAL LOW (ref >39)
LDL Chol Calc (NIH): 52 mg/dL (ref 0–99)
Triglycerides: 209 mg/dL — ABNORMAL HIGH (ref 0–149)
VLDL Cholesterol Cal: 34 mg/dL (ref 5–40)

## 2023-07-27 LAB — URIC ACID: Uric Acid: 6.6 mg/dL (ref 3.0–7.2)

## 2023-07-27 NOTE — Telephone Encounter (Signed)
Call to discuss Lipid lab and uric acid lab, N/A LVM to call back   When call back discuss following-  Uric acid is WNL -continue with Nexletol  LDLc at goal with Lipitor 80mg , Zetia 10 mg and Repatha 140 mg, Nexletol 180 mg however  TG is still above the goal - 209 mg /dl patient is on Trulicity records indicates - 0.75 ng once a week dose - clarify current dose and BG if needed titrate dose or switch GLP1 to Ozempic or Mounjaro for better BG control  Reiterated importance of lifestyle interventions to lower TG

## 2023-08-17 DIAGNOSIS — Z8673 Personal history of transient ischemic attack (TIA), and cerebral infarction without residual deficits: Secondary | ICD-10-CM | POA: Diagnosis not present

## 2023-08-17 DIAGNOSIS — Z6830 Body mass index (BMI) 30.0-30.9, adult: Secondary | ICD-10-CM | POA: Diagnosis not present

## 2023-08-17 DIAGNOSIS — E669 Obesity, unspecified: Secondary | ICD-10-CM | POA: Diagnosis not present

## 2023-08-17 DIAGNOSIS — Z79899 Other long term (current) drug therapy: Secondary | ICD-10-CM | POA: Diagnosis not present

## 2023-08-17 DIAGNOSIS — E785 Hyperlipidemia, unspecified: Secondary | ICD-10-CM | POA: Diagnosis not present

## 2023-08-17 DIAGNOSIS — E1169 Type 2 diabetes mellitus with other specified complication: Secondary | ICD-10-CM | POA: Diagnosis not present

## 2023-08-17 DIAGNOSIS — Z0000 Encounter for general adult medical examination without abnormal findings: Secondary | ICD-10-CM | POA: Diagnosis not present

## 2023-08-17 DIAGNOSIS — I10 Essential (primary) hypertension: Secondary | ICD-10-CM | POA: Diagnosis not present

## 2023-08-17 DIAGNOSIS — Z0189 Encounter for other specified special examinations: Secondary | ICD-10-CM | POA: Diagnosis not present

## 2023-08-29 ENCOUNTER — Telehealth: Payer: Self-pay | Admitting: Dietician

## 2023-08-29 ENCOUNTER — Other Ambulatory Visit: Payer: Self-pay | Admitting: Student

## 2023-08-29 DIAGNOSIS — E1169 Type 2 diabetes mellitus with other specified complication: Secondary | ICD-10-CM

## 2023-08-29 MED ORDER — DEXCOM G7 SENSOR MISC
3 refills | Status: AC
Start: 2023-08-29 — End: ?

## 2023-08-29 NOTE — Telephone Encounter (Signed)
Needs a prescription for  Dexcom G7, 3 month supply sent to [facility] at [address] , [place] .  No longer interested in PREP program

## 2023-09-03 DIAGNOSIS — D6859 Other primary thrombophilia: Secondary | ICD-10-CM | POA: Diagnosis not present

## 2023-09-03 DIAGNOSIS — N183 Chronic kidney disease, stage 3 unspecified: Secondary | ICD-10-CM | POA: Diagnosis not present

## 2023-09-03 DIAGNOSIS — D649 Anemia, unspecified: Secondary | ICD-10-CM | POA: Diagnosis not present

## 2023-09-03 DIAGNOSIS — Z0001 Encounter for general adult medical examination with abnormal findings: Secondary | ICD-10-CM | POA: Diagnosis not present

## 2023-09-03 DIAGNOSIS — I700 Atherosclerosis of aorta: Secondary | ICD-10-CM | POA: Diagnosis not present

## 2023-09-03 DIAGNOSIS — E785 Hyperlipidemia, unspecified: Secondary | ICD-10-CM | POA: Diagnosis not present

## 2023-09-03 DIAGNOSIS — E663 Overweight: Secondary | ICD-10-CM | POA: Diagnosis not present

## 2023-09-03 DIAGNOSIS — I10 Essential (primary) hypertension: Secondary | ICD-10-CM | POA: Diagnosis not present

## 2023-09-03 DIAGNOSIS — Z6828 Body mass index (BMI) 28.0-28.9, adult: Secondary | ICD-10-CM | POA: Diagnosis not present

## 2023-09-03 DIAGNOSIS — E1122 Type 2 diabetes mellitus with diabetic chronic kidney disease: Secondary | ICD-10-CM | POA: Diagnosis not present

## 2023-09-03 DIAGNOSIS — I509 Heart failure, unspecified: Secondary | ICD-10-CM | POA: Diagnosis not present

## 2023-09-03 DIAGNOSIS — E1142 Type 2 diabetes mellitus with diabetic polyneuropathy: Secondary | ICD-10-CM | POA: Diagnosis not present

## 2023-09-12 ENCOUNTER — Emergency Department (HOSPITAL_COMMUNITY): Payer: Medicare (Managed Care)

## 2023-09-12 ENCOUNTER — Other Ambulatory Visit: Payer: Self-pay

## 2023-09-12 ENCOUNTER — Encounter (HOSPITAL_COMMUNITY): Payer: Self-pay

## 2023-09-12 ENCOUNTER — Ambulatory Visit: Payer: Medicare (Managed Care) | Admitting: Gastroenterology

## 2023-09-12 ENCOUNTER — Observation Stay (HOSPITAL_COMMUNITY)
Admission: EM | Admit: 2023-09-12 | Discharge: 2023-09-14 | Disposition: A | Discharge status: Home / Self Care | Payer: Medicare (Managed Care) | Attending: Internal Medicine | Admitting: Internal Medicine

## 2023-09-12 DIAGNOSIS — Z7901 Long term (current) use of anticoagulants: Secondary | ICD-10-CM | POA: Insufficient documentation

## 2023-09-12 DIAGNOSIS — I63331 Cerebral infarction due to thrombosis of right posterior cerebral artery: Secondary | ICD-10-CM | POA: Diagnosis not present

## 2023-09-12 DIAGNOSIS — R531 Weakness: Secondary | ICD-10-CM | POA: Diagnosis not present

## 2023-09-12 DIAGNOSIS — I10 Essential (primary) hypertension: Secondary | ICD-10-CM | POA: Diagnosis not present

## 2023-09-12 DIAGNOSIS — I6349 Cerebral infarction due to embolism of other cerebral artery: Secondary | ICD-10-CM | POA: Diagnosis not present

## 2023-09-12 DIAGNOSIS — E1169 Type 2 diabetes mellitus with other specified complication: Secondary | ICD-10-CM

## 2023-09-12 DIAGNOSIS — Z794 Long term (current) use of insulin: Secondary | ICD-10-CM | POA: Insufficient documentation

## 2023-09-12 DIAGNOSIS — R3915 Urgency of urination: Secondary | ICD-10-CM | POA: Diagnosis not present

## 2023-09-12 DIAGNOSIS — E119 Type 2 diabetes mellitus without complications: Secondary | ICD-10-CM | POA: Insufficient documentation

## 2023-09-12 DIAGNOSIS — Z79899 Other long term (current) drug therapy: Secondary | ICD-10-CM | POA: Diagnosis not present

## 2023-09-12 DIAGNOSIS — I6782 Cerebral ischemia: Secondary | ICD-10-CM | POA: Diagnosis not present

## 2023-09-12 DIAGNOSIS — Z8673 Personal history of transient ischemic attack (TIA), and cerebral infarction without residual deficits: Secondary | ICD-10-CM | POA: Diagnosis not present

## 2023-09-12 DIAGNOSIS — R2689 Other abnormalities of gait and mobility: Secondary | ICD-10-CM | POA: Diagnosis not present

## 2023-09-12 DIAGNOSIS — M6281 Muscle weakness (generalized): Secondary | ICD-10-CM | POA: Diagnosis not present

## 2023-09-12 DIAGNOSIS — I4891 Unspecified atrial fibrillation: Secondary | ICD-10-CM | POA: Diagnosis not present

## 2023-09-12 DIAGNOSIS — Z7985 Long-term (current) use of injectable non-insulin antidiabetic drugs: Secondary | ICD-10-CM | POA: Insufficient documentation

## 2023-09-12 DIAGNOSIS — Z91148 Patient's other noncompliance with medication regimen for other reason: Secondary | ICD-10-CM

## 2023-09-12 DIAGNOSIS — Z7984 Long term (current) use of oral hypoglycemic drugs: Secondary | ICD-10-CM | POA: Diagnosis not present

## 2023-09-12 DIAGNOSIS — Z87891 Personal history of nicotine dependence: Secondary | ICD-10-CM | POA: Diagnosis not present

## 2023-09-12 DIAGNOSIS — R200 Anesthesia of skin: Secondary | ICD-10-CM | POA: Diagnosis not present

## 2023-09-12 DIAGNOSIS — R29818 Other symptoms and signs involving the nervous system: Secondary | ICD-10-CM | POA: Diagnosis not present

## 2023-09-12 DIAGNOSIS — I639 Cerebral infarction, unspecified: Secondary | ICD-10-CM | POA: Diagnosis present

## 2023-09-12 DIAGNOSIS — I63531 Cerebral infarction due to unspecified occlusion or stenosis of right posterior cerebral artery: Secondary | ICD-10-CM

## 2023-09-12 LAB — COMPREHENSIVE METABOLIC PANEL
ALT: 20 U/L (ref 0–44)
AST: 20 U/L (ref 15–41)
Albumin: 3.5 g/dL (ref 3.5–5.0)
Alkaline Phosphatase: 64 U/L (ref 38–126)
Anion gap: 11 (ref 5–15)
BUN: 20 mg/dL (ref 8–23)
CO2: 25 mmol/L (ref 22–32)
Calcium: 9.5 mg/dL (ref 8.9–10.3)
Chloride: 101 mmol/L (ref 98–111)
Creatinine, Ser: 1.13 mg/dL — ABNORMAL HIGH (ref 0.44–1.00)
GFR, Estimated: 53 mL/min — ABNORMAL LOW (ref >60)
Glucose, Bld: 246 mg/dL — ABNORMAL HIGH (ref 70–99)
Potassium: 4 mmol/L (ref 3.5–5.1)
Sodium: 137 mmol/L (ref 135–145)
Total Bilirubin: 0.2 mg/dL (ref <1.2)
Total Protein: 7.6 g/dL (ref 6.5–8.1)

## 2023-09-12 LAB — I-STAT CHEM 8, ED
BUN: 23 mg/dL (ref 8–23)
Calcium, Ion: 1.21 mmol/L (ref 1.15–1.40)
Chloride: 102 mmol/L (ref 98–111)
Creatinine, Ser: 1.1 mg/dL — ABNORMAL HIGH (ref 0.44–1.00)
Glucose, Bld: 252 mg/dL — ABNORMAL HIGH (ref 70–99)
HCT: 34 % — ABNORMAL LOW (ref 36.0–46.0)
Hemoglobin: 11.6 g/dL — ABNORMAL LOW (ref 12.0–15.0)
Potassium: 4.1 mmol/L (ref 3.5–5.1)
Sodium: 141 mmol/L (ref 135–145)
TCO2: 26 mmol/L (ref 22–32)

## 2023-09-12 LAB — URINALYSIS, ROUTINE W REFLEX MICROSCOPIC
Bacteria, UA: NONE SEEN
Bilirubin Urine: NEGATIVE
Glucose, UA: >=500 mg/dL — AB
Hgb urine dipstick: NEGATIVE
Ketones, ur: NEGATIVE mg/dL
Leukocytes,Ua: NEGATIVE
Nitrite: NEGATIVE
Protein, ur: NEGATIVE mg/dL
Specific Gravity, Urine: 1.016 (ref 1.005–1.030)
pH: 5 (ref 5.0–8.0)

## 2023-09-12 LAB — DIFFERENTIAL
Abs Immature Granulocytes: 0.03 10*3/uL (ref 0.00–0.07)
Basophils Absolute: 0.1 10*3/uL (ref 0.0–0.1)
Basophils Relative: 0 %
Eosinophils Absolute: 0.1 10*3/uL (ref 0.0–0.5)
Eosinophils Relative: 1 %
Immature Granulocytes: 0 %
Lymphocytes Relative: 15 %
Lymphs Abs: 1.6 10*3/uL (ref 0.7–4.0)
Monocytes Absolute: 0.8 10*3/uL (ref 0.1–1.0)
Monocytes Relative: 7 %
Neutro Abs: 8.7 10*3/uL — ABNORMAL HIGH (ref 1.7–7.7)
Neutrophils Relative %: 77 %

## 2023-09-12 LAB — RAPID URINE DRUG SCREEN, HOSP PERFORMED
Amphetamines: NOT DETECTED
Barbiturates: NOT DETECTED
Benzodiazepines: NOT DETECTED
Cocaine: NOT DETECTED
Opiates: NOT DETECTED
Tetrahydrocannabinol: NOT DETECTED

## 2023-09-12 LAB — CBC
HCT: 32.9 % — ABNORMAL LOW (ref 36.0–46.0)
Hemoglobin: 10.6 g/dL — ABNORMAL LOW (ref 12.0–15.0)
MCH: 28.9 pg (ref 26.0–34.0)
MCHC: 32.2 g/dL (ref 30.0–36.0)
MCV: 89.6 fL (ref 80.0–100.0)
Platelets: 445 10*3/uL — ABNORMAL HIGH (ref 150–400)
RBC: 3.67 MIL/uL — ABNORMAL LOW (ref 3.87–5.11)
RDW: 15.8 % — ABNORMAL HIGH (ref 11.5–15.5)
WBC: 11.3 10*3/uL — ABNORMAL HIGH (ref 4.0–10.5)
nRBC: 0 % (ref 0.0–0.2)

## 2023-09-12 LAB — PROTIME-INR
INR: 1.3 — ABNORMAL HIGH (ref 0.8–1.2)
Prothrombin Time: 16.2 s — ABNORMAL HIGH (ref 11.4–15.2)

## 2023-09-12 LAB — ETHANOL: Alcohol, Ethyl (B): <10 mg/dL (ref <10)

## 2023-09-12 LAB — GLUCOSE, CAPILLARY: Glucose-Capillary: 259 mg/dL — ABNORMAL HIGH (ref 70–99)

## 2023-09-12 LAB — APTT: aPTT: 32 s (ref 24–36)

## 2023-09-12 MED ORDER — INSULIN DETEMIR 100 UNIT/ML ~~LOC~~ SOLN
10.0000 [IU] | Freq: Every day | SUBCUTANEOUS | Status: DC
  Administered 2023-09-13 – 2023-09-14 (×2): 10 [IU] via SUBCUTANEOUS
  Filled 2023-09-12 (×2): qty 0.1

## 2023-09-12 MED ORDER — EMPAGLIFLOZIN 25 MG PO TABS
25.0000 mg | ORAL_TABLET | Freq: Every day | ORAL | Status: DC
  Administered 2023-09-13 – 2023-09-14 (×2): 25 mg via ORAL
  Filled 2023-09-12 (×2): qty 1

## 2023-09-12 MED ORDER — EZETIMIBE 10 MG PO TABS
10.0000 mg | ORAL_TABLET | Freq: Every day | ORAL | Status: DC
  Administered 2023-09-13 – 2023-09-14 (×2): 10 mg via ORAL
  Filled 2023-09-12 (×2): qty 1

## 2023-09-12 MED ORDER — SPIRONOLACTONE 25 MG PO TABS
25.0000 mg | ORAL_TABLET | Freq: Every day | ORAL | Status: DC
  Administered 2023-09-13 – 2023-09-14 (×2): 25 mg via ORAL
  Filled 2023-09-12 (×2): qty 1

## 2023-09-12 MED ORDER — LOSARTAN POTASSIUM 50 MG PO TABS
100.0000 mg | ORAL_TABLET | Freq: Every day | ORAL | Status: DC
  Administered 2023-09-13 – 2023-09-14 (×2): 100 mg via ORAL
  Filled 2023-09-12 (×2): qty 2

## 2023-09-12 MED ORDER — ENOXAPARIN SODIUM 40 MG/0.4ML IJ SOSY: 40.0000 mg | PREFILLED_SYRINGE | INTRAMUSCULAR | Status: DC

## 2023-09-12 MED ORDER — APIXABAN 5 MG PO TABS
5.0000 mg | ORAL_TABLET | Freq: Two times a day (BID) | ORAL | Status: DC
  Administered 2023-09-12 – 2023-09-14 (×4): 5 mg via ORAL
  Filled 2023-09-12 (×4): qty 1

## 2023-09-12 MED ORDER — ATORVASTATIN CALCIUM 80 MG PO TABS
80.0000 mg | ORAL_TABLET | Freq: Every day | ORAL | Status: DC
  Administered 2023-09-13 – 2023-09-14 (×2): 80 mg via ORAL
  Filled 2023-09-12 (×2): qty 1

## 2023-09-12 MED ORDER — CARVEDILOL 12.5 MG PO TABS
12.5000 mg | ORAL_TABLET | Freq: Two times a day (BID) | ORAL | Status: DC
  Administered 2023-09-13 – 2023-09-14 (×3): 12.5 mg via ORAL
  Filled 2023-09-12 (×3): qty 1

## 2023-09-12 MED ORDER — AMLODIPINE BESYLATE 5 MG PO TABS
10.0000 mg | ORAL_TABLET | Freq: Every day | ORAL | Status: DC
  Administered 2023-09-13 – 2023-09-14 (×2): 10 mg via ORAL
  Filled 2023-09-12 (×2): qty 2

## 2023-09-12 MED ORDER — INSULIN ASPART 100 UNIT/ML IJ SOLN
0.0000 [IU] | Freq: Three times a day (TID) | INTRAMUSCULAR | Status: DC
  Administered 2023-09-13 – 2023-09-14 (×4): 3 [IU] via SUBCUTANEOUS

## 2023-09-12 MED ORDER — GABAPENTIN 100 MG PO CAPS: 100.0000 mg | ORAL_CAPSULE | Freq: Every day | ORAL | Status: DC

## 2023-09-12 NOTE — ED Provider Notes (Signed)
EMERGENCY DEPARTMENT AT [facility] Provider Note   CSN: [id number] Arrival date & time: 09/12/23  1148     History  Chief Complaint  Patient presents with   Weakness   Numbness    Cheyenne Valenzuela is a 67 y.o. female.   Weakness Patient brought in with left-sided numbness and weakness.  Started with some numbness in the fourth and fifth fingers on her left hand and third and fourth toes on her left foot.  That has been going for weeks.  Around a week and a half ago developed increasing weakness on the left side.  Saw neurology although soft before the weakness developed.  Now having difficulty speaking.  Some slurred speech.  Difficulty using her left hand, also has dragging of her foot on the left.  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 Authorizing Provider  amLODipine (NORVASC) 10 MG tablet TAKE 1 TABLET(10 MG) BY MOUTH DAILY 02/26/23   Crissie Sickles, MD  apixaban (ELIQUIS) 5 MG TABS tablet Take 1 tablet (5 mg total) by mouth 2 (two) times daily. 08/18/22   Briscoe Burns, MD  atorvastatin (LIPITOR) 80 MG tablet Take 1 tablet (80 mg total) by mouth daily. 08/18/22   Briscoe Burns, MD  Bempedoic Acid (NEXLETOL) 180 MG TABS Take 1 tablet (180 mg total) by mouth daily. 06/18/23   Lanier Prude, MD  Blood Glucose Monitoring Suppl (ACCU-CHEK GUIDE) w/Device KIT Check blood sugar 3 times per day. 08/21/22   Briscoe Burns, MD  carvedilol (COREG) 12.5 MG tablet TAKE 1 TABLET(12.5 MG) BY MOUTH TWICE DAILY WITH A MEAL 02/08/23   Crissie Sickles, MD  Continuous Glucose Sensor (DEXCOM G7 SENSOR) MISC Change one sensor every 10 days 08/29/23   Morene Crocker, MD  empagliflozin (JARDIANCE) 25 MG TABS tablet Take 1 tablet (25 mg total) by mouth daily before breakfast. 03/12/23   Briscoe Burns, MD  Evolocumab (REPATHA SURECLICK) 140 MG/ML SOAJ Inject 140 mg into the skin every 14 (fourteen) days. 03/21/23   Lanier Prude, MD  ezetimibe (ZETIA) 10 MG tablet Take 1 tablet (10 mg total) by mouth daily. 03/12/23 09/08/23  Briscoe Burns, MD  glucose blood (ONETOUCH VERIO) test strip Check blood sugar up to 3 times a day. 09/06/22   Crissie Sickles, MD  Insulin Pen Needle (PEN NEEDLES) 32G X 4 MM MISC 1 Needle by Does not apply route daily. 08/18/22   Briscoe Burns, MD  Lancets [facility] DELICA PLUS [place]) MISC Check blood sugar up to 3 times a day 09/06/22   Crissie Sickles, MD  losartan (COZAAR) 100 MG tablet Take 1 tablet (100 mg total) by mouth daily. 06/07/23 06/06/24  Morrie Sheldon, MD  losartan-hydrochlorothiazide (HYZAAR) 100-25 MG tablet Take 1 tablet by mouth daily. 03/12/23   Briscoe Burns, MD  metFORMIN (GLUCOPHAGE) 1000 MG tablet Take 1 tablet (1,000 mg total) by mouth 2 (two) times daily with a meal. 03/12/23   Briscoe Burns, MD  spironolactone (ALDACTONE) 25 MG tablet Take 1 tablet (25 mg total) by mouth daily. 03/12/23 03/11/24  Briscoe Burns, MD  TRULICITY  0.75 MG/0.5ML SOPN ADMINISTER 0.75 MG UNDER THE SKIN 1 TIME A WEEK 03/01/23   Crissie Sickles, MD      Allergies    Lisinopril    Review of Systems   Review of Systems  Neurological:  Positive for weakness.    Physical Exam Updated Vital Signs BP (!) 176/80 (BP Location: Right Arm)   Pulse 73   Temp 97.8 F (36.6 C) (Oral)   Resp 18   Ht 5\' 3"  (1.6 m)   Wt 79.4 kg   SpO2 100%   BMI 31.00 kg/m  Physical Exam Eyes:     Extraocular Movements: Extraocular movements intact.     Comments: Left-sided homonymous hemianopsia  Cardiovascular:     Rate and Rhythm: Regular rhythm.  Chest:     Chest wall: No tenderness.  Abdominal:     Tenderness: There is no  abdominal tenderness.  Neurological:     Mental Status: She is alert.     Comments: Finger-nose intact bilaterally.  States some difficulty moving the left side but strength appears grossly intact.  Appears to be some weakness in the left lower extremity compared to the right.  Mildly slurred speech.     ED Results / Procedures / Treatments   Labs (all labs ordered are listed, but only abnormal results are displayed) Labs Reviewed  PROTIME-INR - Abnormal; Notable for the following components:      Result Value   Prothrombin Time 16.2 (*)    INR 1.3 (*)    All other components within normal limits  CBC - Abnormal; Notable for the following components:   WBC 11.3 (*)    RBC 3.67 (*)    Hemoglobin 10.6 (*)    HCT 32.9 (*)    RDW 15.8 (*)    Platelets 445 (*)    All other components within normal limits  DIFFERENTIAL - Abnormal; Notable for the following components:   Neutro Abs 8.7 (*)    All other components within normal limits  COMPREHENSIVE METABOLIC PANEL - Abnormal; Notable for the following components:   Glucose, Bld 246 (*)    Creatinine, Ser 1.13 (*)    GFR, Estimated 53 (*)    All other components within normal limits  URINALYSIS, ROUTINE W REFLEX MICROSCOPIC - Abnormal; Notable for the following components:   Glucose, UA >=500 (*)    All other components within normal limits  I-STAT CHEM 8, ED - Abnormal; Notable for the following components:   Creatinine, Ser 1.10 (*)    Glucose, Bld 252 (*)    Hemoglobin 11.6 (*)    HCT 34.0 (*)    All other components within normal limits  ETHANOL  APTT  RAPID URINE DRUG SCREEN, HOSP PERFORMED    EKG None  Radiology CT HEAD WO CONTRAST  Result Date: 09/12/2023 CLINICAL DATA:  Weakness and numbness, stroke suspected EXAM: CT HEAD WITHOUT CONTRAST TECHNIQUE: Contiguous axial images were obtained from the base of the skull through the vertex without intravenous contrast. RADIATION DOSE REDUCTION: This exam was performed  according to the departmental dose-optimization program which includes automated exposure control, adjustment of the mA and/or kV according to patient size and/or use of iterative reconstruction technique. COMPARISON:  05/14/2023 CT head FINDINGS: Brain: Hypodensity in the superior left cerebellum (series 4, image 18 and series 5, image 36) is new from the prior exam but technically age indeterminate. No evidence of acute hemorrhage, mass, mass effect, or midline shift. No hydrocephalus or extra-axial fluid collection. Redemonstrated  right PCA territory, right cerebellar, and right thalamic infarcts. Vascular: No hyperdense vessel. Skull: Negative for fracture or focal lesion. Sinuses/Orbits: No acute finding. Other: The mastoid air cells are well aerated. IMPRESSION: 1. Hypodensity in the superior left cerebellum is new from the prior exam but technically age indeterminate. MRI is recommended for further evaluation. 2. Redemonstrated remote right PCA territory, right cerebellar, and right thalamic infarcts. Electronically Signed   By: Wiliam Ke M.D.   On: 09/12/2023 14:35    Procedures Procedures    Medications Ordered in ED Medications - No data to display  ED Course/ Medical Decision Making/ A&P                                 Medical Decision Making  Patient with weakness on the left side.  Has had for weeks.  Not a TNK candidate due to time of onset.  Differential diagnose includes causes such as stroke.  Also other causes such as MS.  Has had worsening deficits.  Some difficulty walking.  More difficulty using her left hand.  Discussed over the phone with Dr. Iver Nestle from neurology.  We think since worsening deficits and somewhat debilitating will benefit from admission to the hospital for further workup and potentially PT OT.  Will get MRI here but may need further workup both of it does show stroke and if it does not.  Will discuss with internal medicine residents for  admission.        Final Clinical Impression(s) / ED Diagnoses Final diagnoses:  Weakness    Rx / DC Orders ED Discharge Orders     None         Benjiman Core, MD 09/12/23 1649

## 2023-09-12 NOTE — H&P (Signed)
Date: 09/12/2023               Patient Name:  Cheyenne Valenzuela MRN: [id number]  DOB: 1956/08/31 Age / Sex: 67 y.o., female   PCP: Carmina Miller, DO         Medical Service: Internal Medicine Teaching Service         Attending Physician: Dr. Mercie Eon, MD      First Contact: Dr. Philomena Doheny, MD Pager [phone]    Second Contact: Dr. 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  About a week and a half ago she started to notice numbness and tingling in her left fingers and toes and then in the middle of last week she suddenly had more weakness in her left leg and left arm that has caused some issues with her walking.  Her daughter was in the room and stated that over the weekend she looked like she had a little bit of a facial droop on the right side.  She has also noted some dizziness that occurs when she is standing up and when she turns her head quickly that is similar to what she experienced last time she had a stroke.  She has also had increased urinary urgency without dysuria, suprapubic pain, fever, chills, or hematuria.  Otherwise she does not have any acute concerns and denied headache, chest pain, shortness of breath, abdominal pain, nausea, vomiting, change in bowel habits, increased lower extremity edema, falls, presyncope, or syncope.  ED Course: Presented with concerns as above hemodynamically stable on room air.  CT of the head without contrast showed a hypodensity in the superior left cerebellum that was new from prior imaging and  09/2022 and recommended MRI for further evaluation.  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  Is independent in ADLs and IADLs up until this past week. PCP: Carmina Miller, DO Substances: Remote smoking history less than 10 years.  No alcohol or drug use.  Family History:  Family History  Problem Relation Age of Onset   Diabetes Mother    Diabetes Sister      Allergies: Allergies as of 09/12/2023 - Review Complete 09/12/2023  Allergen Reaction Noted   Lisinopril Swelling 06/13/2014    Review of Systems: A complete ROS was negative except as per HPI.   OBJECTIVE:   Physical Exam: Blood pressure (!) 176/80, pulse 73, temperature 97.8 F (36.6 C), temperature source  Oral, resp. rate 18, height 5\' 3"  (1.6 m), weight 79.4 kg, SpO2 100%.  Constitutional: Well-appearing middle-age  female laying in bed. In no acute distress. HENT: Normocephalic, atraumatic,  Eyes: Sclera non-icteric, PERRL, EOM intact Cardio:Regular rate and rhythm. 2+ bilateral radial and dorsalis pedis  pulses. Pulm: Normal work of breathing on room air. Abdomen: Soft, non-tender, non-distended MSK: Trace lower extremity edema bilaterally. Skin:Warm and dry. Neuro:Alert and oriented x3.  4/5 strength in left hip flexion otherwise 5/5 strength in bilateral upper and  lower extremities.  Normal heel-to-shin bilaterally.  Slightly abnormal finger-to-nose with the left arm.  Cranial nerves II through XII intact with left-sided visual deficits   EKG: personally reviewed my interpretation is sinus rhythm with first-degree AV block. Consistent with prior EKG.  ASSESSMENT & PLAN:   Assessment & Plan by Problem: Principal Problem:   Stroke [facility])   Starrla Metoxen is a 67 y.o. 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  Subacute left cerebellar infarct Deficits grossly correlate to hypodensity seen on CT scan and preliminary MRI read confirmed stroke.  Likely occurred 4-8 days ago and deficits have been stable since this weekend at least.  Neurology is on board for further testing.  Patient is already on anticoagulation, 3 cholesterol medications, has good blood pressure control, and good control of her diabetes.  She is not in A-fib at this moment and since this pattern is not consistent with embolic stroke we do not have a reason to call this an Eliquis failure.  She may still need further evaluation of her left hand and foot numbness and tingling as it is unclear if these were connected to her stroke. - Continue home atorvastatin, Zetia, Repatha, amlodipine, carvedilol, losartan, spironolactone, Eliquis, Jardiance - Appreciate neurology input - PT/OT eval tomorrow - Follow-up MRI official read  Urinary urgency With patient complains of urinary urgency and mild leukocytosis but no fevers, bacteria or white blood cells on urinalysis we will not treat for a urinary tract infection.  Will continue to monitor and if there are any signs of infection we will collect another urine.  T2DM Last A1c was 6.7 on 03/12/2023.  Home regimen includes Lantus 10 units every morning,  metformin 1000 mg twice daily, and Trulicity 0.75 mg weekly. - Lantus 10 units daily and moderate SSI  Hypertension Blood pressure here is elevated at 176/80.  Patient is not in the window for permissive hypertension so we will restart her home antihypertensives. - Resume amlodipine, carvedilol, losartan, and spironolactone  Atrial fibrillation Not in atrial fibrillation here and is on anticoagulation. - Continue Eliquis and carvedilol  Diet: Carb-Modified VTE: DOAC IVF: None, Code: Full  Dispo: Admit patient to Observation with expected length of stay less than 2 midnights.  Signed: Rocky Morel, DO Internal Medicine Resident PGY-2  09/12/2023, 6:19 PM   Dr. Philomena Doheny, MD Pager [phone]

## 2023-09-12 NOTE — ED Provider Triage Note (Signed)
Emergency Medicine Provider Triage Evaluation Note  Cheyenne Valenzuela , a 67 y.o. female  was evaluated in triage.  Pt complains of left lower extremity numbness and weakness.  Symptoms began on 11/26.  Daughter has noticed a shuffling gait and an off-balance sensation as well.  No back pain, trauma or falls.  Review of Systems  Positive: As above Negative: As above  Physical Exam  BP (!) 160/79   Pulse 78   Temp (!) 97.3 F (36.3 C)   Resp 18   SpO2 99%  Gen:   Awake, no distress   Resp:  Normal effort  MSK:   Moves extremities without difficulty  Other:    MENTAL STATUS: AAOx3   LANG/SPEECH: Fluent, intact naming, repetition & comprehension   CRANIAL NERVES:   II: Pupils equal and reactive   III, IV, VI: EOM intact, no gaze preference or deviation, no nystagmus   V: normal sensation of the face   VII: no facial asymmetry   VIII: normal hearing to speech   MOTOR: Noticeable weakness to the left lower extremity   SENSORY: Normal to touch in all extremiteis   COORD: Normal finger to nose, heel to shin and shoulder shrug on right, inability to fully complete heel-to-shin with left lower extremity, no tremor, no dysmetria. No pronator drift   Medical Decision Making  Medically screening exam initiated at 12:26 PM.  Appropriate orders placed.  Niveen Ison was informed that the remainder of the evaluation will be completed by another provider, this initial triage assessment does not replace that evaluation, and the importance of remaining in the ED until their evaluation is complete.  Workup initiated   Michelle Piper, Cordelia Poche 09/12/23 1227

## 2023-09-12 NOTE — ED Triage Notes (Addendum)
Patient c/o weakness and numbness to left side starting 11-26, worse over the weekend with facial droop and slurred speech. Patient speech appears slurred at this time but she is otherwise A&Ox4. Follows with neuro for other conditions. Hx of stroke, patient is on eliquis.

## 2023-09-12 NOTE — ED Notes (Signed)
ED TO INPATIENT HANDOFF REPORT  ED Nurse Name and Phone #: Gardiner Ramus [phone]  S Name/Age/Gender Cheyenne Valenzuela 67 y.o. female Room/Bed: 028C/028C  Code Status   Code Status: Full Code  Home/SNF/Other Home Patient oriented to: self, place, time, and situation Is this baseline? Yes   Triage Complete: Triage complete  Chief Complaint Stroke [facility]) [I63.9]  Triage Note Patient c/o weakness and numbness to left side starting 11-26, worse over the weekend with facial droop and slurred speech. Patient speech appears slurred at this time but she is otherwise A&Ox4. Follows with neuro for other conditions. Hx of stroke, patient is on eliquis.   Allergies Allergies  Allergen Reactions   Lisinopril Swelling    Level of Care/Admitting Diagnosis ED Disposition     ED Disposition  Admit   Condition  --   Comment  Hospital Area: [facility] [[id number]]  Level of Care: Med-Surg [16]  May place patient in observation at [facility] or [facility] if equivalent level of care is available:: Yes  Covid Evaluation: Asymptomatic - no recent exposure (last 10 days) testing not required  Diagnosis: Stroke [facility]) [[id number]]  Admitting Physician: Mercie Eon [[id number]]  Attending Physician: Mercie Eon [[id number]]          B Medical/Surgery History Past Medical History:  Diagnosis Date   Cough 06/24/2021   Hypercholesteremia    Hypertension    Influenza A 11/21/2021   Stroke [facility])    Past Surgical History:  Procedure Laterality Date   ABDOMINAL HYSTERECTOMY     APPENDECTOMY       A IV Location/Drains/Wounds Patient Lines/Drains/Airways Status     Active Line/Drains/Airways     Name Placement date Placement time Site Days   Peripheral IV 09/12/23 22 G 1.75" Left;Posterior Forearm 09/12/23  1842  Forearm  less than 1            Intake/Output Last 24 hours No intake or output data in the 24 hours ending 09/12/23 1957  Labs/Imaging Results for orders  placed or performed during the hospital encounter of 09/12/23 (from the past 48 hour(s))  Ethanol     Status: None   Collection Time: 09/12/23 12:53 PM  Result Value Ref Range   Alcohol, Ethyl (B) <10 <10 mg/dL    Comment: (NOTE) Lowest detectable limit for serum alcohol is 10 mg/dL.  For medical purposes only. Performed at [facility], [address], [place], [place] [address]   Protime-INR     Status: Abnormal   Collection Time: 09/12/23 12:53 PM  Result Value Ref Range   Prothrombin Time 16.2 (H) 11.4 - 15.2 seconds   INR 1.3 (H) 0.8 - 1.2    Comment: (NOTE) INR goal varies based on device and disease states. Performed at [facility], [address], [place], [place] [address]   APTT     Status: None   Collection Time: 09/12/23 12:53 PM  Result Value Ref Range   aPTT 32 24 - 36 seconds    Comment: Performed at [facility], [address], [place], [place] [address]  CBC     Status: Abnormal   Collection Time: 09/12/23 12:53 PM  Result Value Ref Range   WBC 11.3 (H) 4.0 - 10.5 K/uL   RBC 3.67 (L) 3.87 - 5.11 MIL/uL   Hemoglobin 10.6 (L) 12.0 - 15.0 g/dL   HCT 41.3 (L) 24.4 - 01.0 %   MCV 89.6 80.0 - 100.0 fL  MCH 28.9 26.0 - 34.0 pg   MCHC 32.2 30.0 - 36.0 g/dL   RDW 09.8 (H) 11.9 - 14.7 %   Platelets 445 (H) 150 - 400 K/uL   nRBC 0.0 0.0 - 0.2 %    Comment: Performed at [facility], [address] [place], [place] [address]  Differential     Status: Abnormal   Collection Time: 09/12/23 12:53 PM  Result Value Ref Range   Neutrophils Relative % 77 %   Neutro Abs 8.7 (H) 1.7 - 7.7 K/uL   Lymphocytes Relative 15 %   Lymphs Abs 1.6 0.7 - 4.0 K/uL   Monocytes Relative 7 %   Monocytes Absolute 0.8 0.1 - 1.0 K/uL   Eosinophils Relative 1 %   Eosinophils Absolute 0.1 0.0 - 0.5 K/uL   Basophils Relative 0 %   Basophils Absolute 0.1 0.0 - 0.1 K/uL   Immature Granulocytes 0 %   Abs Immature Granulocytes 0.03 0.00 - 0.07 K/uL    Comment:  Performed at [facility], [address], [place], [place] [address]  Comprehensive metabolic panel     Status: Abnormal   Collection Time: 09/12/23 12:53 PM  Result Value Ref Range   Sodium 137 135 - 145 mmol/L   Potassium 4.0 3.5 - 5.1 mmol/L   Chloride 101 98 - 111 mmol/L   CO2 25 22 - 32 mmol/L   Glucose, Bld 246 (H) 70 - 99 mg/dL    Comment: Glucose reference range applies only to samples taken after fasting for at least 8 hours.   BUN 20 8 - 23 mg/dL   Creatinine, Ser 6.57 (H) 0.44 - 1.00 mg/dL   Calcium 9.5 8.9 - 84.6 mg/dL   Total Protein 7.6 6.5 - 8.1 g/dL   Albumin 3.5 3.5 - 5.0 g/dL   AST 20 15 - 41 U/L   ALT 20 0 - 44 U/L   Alkaline Phosphatase 64 38 - 126 U/L   Total Bilirubin 0.2 <1.2 mg/dL   GFR, Estimated 53 (L) >60 mL/min    Comment: (NOTE) Calculated using the CKD-EPI Creatinine Equation (2021)    Anion gap 11 5 - 15    Comment: Performed at [facility], [address], [place], [place] [address]  Urine rapid drug screen (hosp performed)     Status: None   Collection Time: 09/12/23 12:53 PM  Result Value Ref Range   Opiates NONE DETECTED NONE DETECTED   Cocaine NONE DETECTED NONE DETECTED   Benzodiazepines NONE DETECTED NONE DETECTED   Amphetamines NONE DETECTED NONE DETECTED   Tetrahydrocannabinol NONE DETECTED NONE DETECTED   Barbiturates NONE DETECTED NONE DETECTED    Comment: (NOTE) DRUG SCREEN FOR MEDICAL PURPOSES ONLY.  IF CONFIRMATION IS NEEDED FOR ANY PURPOSE, NOTIFY LAB WITHIN 5 DAYS.  LOWEST DETECTABLE LIMITS FOR URINE DRUG SCREEN Drug Class                     Cutoff (ng/mL) Amphetamine and metabolites    1000 Barbiturate and metabolites    200 Benzodiazepine                 200 Opiates and metabolites        300 Cocaine and metabolites        300 THC                            50 Performed at  [facility], [address], [place], [place] [address]   Urinalysis, Routine w reflex microscopic -Urine, Clean Catch      Status: Abnormal   Collection Time: 09/12/23 12:53 PM  Result Value Ref Range   Color, Urine YELLOW YELLOW   APPearance CLEAR CLEAR   Specific Gravity, Urine 1.016 1.005 - 1.030   pH 5.0 5.0 - 8.0   Glucose, UA >=500 (A) NEGATIVE mg/dL   Hgb urine dipstick NEGATIVE NEGATIVE   Bilirubin Urine NEGATIVE NEGATIVE   Ketones, ur NEGATIVE NEGATIVE mg/dL   Protein, ur NEGATIVE NEGATIVE mg/dL   Nitrite NEGATIVE NEGATIVE   Leukocytes,Ua NEGATIVE NEGATIVE   RBC / HPF 0-5 0 - 5 RBC/hpf   WBC, UA 0-5 0 - 5 WBC/hpf   Bacteria, UA NONE SEEN NONE SEEN   Squamous Epithelial / HPF 6-10 0 - 5 /HPF   Mucus PRESENT     Comment: Performed at [facility], [address] [place], [place] [address]  I-stat chem 8, ED     Status: Abnormal   Collection Time: 09/12/23  1:08 PM  Result Value Ref Range   Sodium 141 135 - 145 mmol/L   Potassium 4.1 3.5 - 5.1 mmol/L   Chloride 102 98 - 111 mmol/L   BUN 23 8 - 23 mg/dL   Creatinine, Ser 5.78 (H) 0.44 - 1.00 mg/dL   Glucose, Bld 469 (H) 70 - 99 mg/dL    Comment: Glucose reference range applies only to samples taken after fasting for at least 8 hours.   Calcium, Ion 1.21 1.15 - 1.40 mmol/L   TCO2 26 22 - 32 mmol/L   Hemoglobin 11.6 (L) 12.0 - 15.0 g/dL   HCT 62.9 (L) 52.8 - 41.3 %   MR BRAIN WO CONTRAST  Result Date: 09/12/2023 CLINICAL DATA:  Neuro deficit, acute, stroke suspected. Left lower extremity numbness and weakness. EXAM: MRI HEAD WITHOUT CONTRAST TECHNIQUE: Multiplanar, multiecho pulse sequences of the brain and surrounding structures were obtained without intravenous contrast. COMPARISON:  Head CT same day. MRI 05/14/2023. CT angiography 02/03/2022 and 05/14/2023. FINDINGS: Brain: Diffusion imaging shows numerous small acute infarctions scattered within both cerebellar hemispheres. Acute infarction affects the mesial temporal lobe focally on the right. Focal infarctions are present within the right thalamus. Punctate acute infarction present  within the left thalamus. No other supra tentorial acute insult. Old infarctions are noted affecting the cerebellum as well. Old infarction in the right occipital lobe and posteromedial temporal lobe. Old infarctions of the right thalamus. Mild chronic small-vessel ischemic change otherwise affecting the cerebral hemispheric white matter. Findings are consistent with recurrent posterior circulation infarctions. 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 Vascular: Major vessels at the base of the brain show flow. Skull and upper cervical spine: Negative Sinuses/Orbits: Clear/normal Other: None IMPRESSION: 1. Numerous small acute infarctions scattered within both cerebellar hemispheres. Acute infarction affects the mesial temporal lobe focally on the right. Focal acute infarctions are present within the right thalamus. Punctate acute infarction present within the left thalamus. Findings are consistent with recurrent posterior circulation infarctions. 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 2. Old infarctions of the cerebellum, right occipital lobe, right thalamus and right posteromedial temporal lobe. Electronically Signed   By: Scherrie Bateman.D.  On: 09/12/2023 18:01   CT HEAD WO CONTRAST  Result Date: 09/12/2023 CLINICAL DATA:  Weakness and numbness, stroke suspected EXAM: CT HEAD WITHOUT CONTRAST TECHNIQUE: Contiguous axial images were obtained from the base of the skull through the vertex without intravenous contrast. RADIATION DOSE REDUCTION: This exam was performed according to the departmental dose-optimization program which includes automated exposure control, adjustment of the mA and/or kV according to patient size and/or use of iterative reconstruction  technique. COMPARISON:  05/14/2023 CT head FINDINGS: Brain: Hypodensity in the superior left cerebellum (series 4, image 18 and series 5, image 36) is new from the prior exam but technically age indeterminate. No evidence of acute hemorrhage, mass, mass effect, or midline shift. No hydrocephalus or extra-axial fluid collection. Redemonstrated right PCA territory, right cerebellar, and right thalamic infarcts. Vascular: No hyperdense vessel. Skull: Negative for fracture or focal lesion. Sinuses/Orbits: No acute finding. Other: The mastoid air cells are well aerated. IMPRESSION: 1. Hypodensity in the superior left cerebellum is new from the prior exam but technically age indeterminate. MRI is recommended for further evaluation. 2. Redemonstrated remote right PCA territory, right cerebellar, and right thalamic infarcts. Electronically Signed   By: Wiliam Ke M.D.   On: 09/12/2023 14:35    Pending Labs Unresulted Labs (From admission, onward)     Start     Ordered   09/13/23 0500  Lipid panel  Tomorrow morning,   R        09/12/23 1902   09/13/23 0500  Hemoglobin A1c  Tomorrow morning,   R       Comments: To assess prior glycemic control    09/12/23 1902   09/13/23 0500  Basic metabolic panel  Tomorrow morning,   R        09/12/23 1902   09/13/23 0500  CBC  Tomorrow morning,   R        09/12/23 1902   09/12/23 1806  HIV Antibody (routine testing w rflx)  (HIV Antibody (Routine testing w reflex) panel)  Once,   R        09/12/23 1818            Vitals/Pain Today's Vitals   09/12/23 1157 09/12/23 1324 09/12/23 1634  BP: (!) 160/79  (!) 176/80  Pulse: 78  73  Resp: 18  18  Temp: (!) 97.3 F (36.3 C)  97.8 F (36.6 C)  TempSrc:   Oral  SpO2: 99%  100%  Weight:  79.4 kg   Height:  5\' 3"  (1.6 m)   PainSc:  0-No pain     Isolation Precautions No active isolations  Medications Medications  insulin detemir (LEVEMIR) injection 10 Units (has no administration in time range)   insulin aspart (novoLOG) injection 0-15 Units (has no administration in time range)  amLODipine (NORVASC) tablet 10 mg (has no administration in time range)  apixaban (ELIQUIS) tablet 5 mg (has no administration in time range)  atorvastatin (LIPITOR) tablet 80 mg (has no administration in time range)  carvedilol (COREG) tablet 12.5 mg (has no administration in time range)  empagliflozin (JARDIANCE) tablet 25 mg (has no administration in time range)  ezetimibe (ZETIA) tablet 10 mg (has no administration in time range)  losartan (COZAAR) tablet 100 mg (has no administration in time range)  spironolactone (ALDACTONE) tablet 25 mg (has no administration in time range)  gabapentin (NEURONTIN) capsule 100 mg (has no administration in time range)    Mobility walks with person assist     Focused  Assessments Neuro Assessment Handoff:  Swallow screen pass? Yes    NIH Stroke Scale  Dizziness Present: No Headache Present: No Interval: Shift assessment Level of Consciousness (1a.)   : Alert, keenly responsive LOC Questions (1b. )   : Answers both questions correctly LOC Commands (1c. )   : Performs both tasks correctly Best Gaze (2. )  : Normal Visual (3. )  : Partial hemianopia Facial Palsy (4. )    : Normal symmetrical movements Motor Arm, Left (5a. )   : No drift Motor Arm, Right (5b. ) : No drift Motor Leg, Left (6a. )  : No drift Motor Leg, Right (6b. ) : No drift Limb Ataxia (7. ): Absent Sensory (8. )  : Mild-to-moderate sensory loss, patient feels pinprick is less sharp or is dull on the affected side, or there is a loss of superficial pain with pinprick, but patient is aware of being touched Best Language (9. )  : No aphasia Dysarthria (10. ): Mild-to-moderate dysarthria, patient slurs at least some words and, at worst, can be understood with some difficulty Extinction/Inattention (11.)   : No Abnormality Complete NIHSS TOTAL: 3     Neuro Assessment:   Neuro Checks:   Shift  assessment (09/12/23 1636)  Has TPA been given? No If patient is a Neuro Trauma and patient is going to OR before floor call report to 4N Charge nurse: [phone] or [phone]   R Recommendations: See Admitting Provider Note  Report given to:   Additional Notes:

## 2023-09-12 NOTE — Hospital Course (Addendum)
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    Subacute left cerebellar infarct Patient with CT and MRI brain concerning for stroke that correlates with deficits. Neurology consulted in ED, patient outside window of interventions. 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 Echo 12/5 with LVEF 60-65%, left atrial size moderately dilated, no evidence of mitral or aortic stenosis. Neurology recommended patient continue with Eliquis 5 mg BID and home cholesterol medications.   Patient with stable deficits on daily physical exams. OT and PT recommending outpatient PT/OT, SLP determined swallowing function WNL and did not recommend any follow up outpatient. Patient has expressed understanding regarding the importance of taking her Eliquis BID consistently, as well as her other home medications including atorvastatin 100 mg daily, Zetia 10 mg daily, amlodipine 10 mg daily, carvedilol 12.5 BID with meals, losartan 100 mg daily, spironolactone 25 mg daily, and Jardiance 25 mg daily which were resumed on admission. Ezetimibe was continued on discharge.   Urinary urgency Mild leukocytosis but no fevers, bacteria or white blood cells on urinalysis. Denied any issues during admission. Did not repeat UA on admission.    T2DM Hgb A1c on admission 8.5%, previous 6.7% on 03/12/2023.  Home regimen includes Lantus 10 units every morning, metformin 1000 mg twice daily, and Trulicity 0.75 mg weekly. Placed on Lantus 10 units daily and moderate SSI during admission. Will need outpatient  follow up for improved control.    Hypertension Blood pressures here 120s-190s/79s-110s. Patient outside the window for permissive hypertension. Resumed home antihypertensives 12/5.  Continued amlodipine 10 mg daily, carvedilol 12.5 mg BID, losartan 100 mg daily, and spironolactone 25 mg daily on admission.    Atrial fibrillation Stable, resumed home anticoagulation and beta blocker. Continued Eliquis 5 mg BID and carvedilol 12.5 mg BID with meals.

## 2023-09-13 ENCOUNTER — Observation Stay (HOSPITAL_COMMUNITY): Payer: Medicare (Managed Care)

## 2023-09-13 ENCOUNTER — Other Ambulatory Visit: Payer: Self-pay

## 2023-09-13 DIAGNOSIS — I6621 Occlusion and stenosis of right posterior cerebral artery: Secondary | ICD-10-CM | POA: Diagnosis not present

## 2023-09-13 DIAGNOSIS — I6300 Cerebral infarction due to thrombosis of unspecified precerebral artery: Secondary | ICD-10-CM

## 2023-09-13 DIAGNOSIS — Z87891 Personal history of nicotine dependence: Secondary | ICD-10-CM | POA: Diagnosis not present

## 2023-09-13 DIAGNOSIS — Z7901 Long term (current) use of anticoagulants: Secondary | ICD-10-CM | POA: Diagnosis not present

## 2023-09-13 DIAGNOSIS — E1165 Type 2 diabetes mellitus with hyperglycemia: Secondary | ICD-10-CM | POA: Diagnosis not present

## 2023-09-13 DIAGNOSIS — I4891 Unspecified atrial fibrillation: Secondary | ICD-10-CM | POA: Diagnosis not present

## 2023-09-13 DIAGNOSIS — Z794 Long term (current) use of insulin: Secondary | ICD-10-CM | POA: Diagnosis not present

## 2023-09-13 DIAGNOSIS — I6389 Other cerebral infarction: Secondary | ICD-10-CM | POA: Diagnosis not present

## 2023-09-13 DIAGNOSIS — R3915 Urgency of urination: Secondary | ICD-10-CM | POA: Diagnosis not present

## 2023-09-13 DIAGNOSIS — I6502 Occlusion and stenosis of left vertebral artery: Secondary | ICD-10-CM | POA: Diagnosis not present

## 2023-09-13 DIAGNOSIS — Z7985 Long-term (current) use of injectable non-insulin antidiabetic drugs: Secondary | ICD-10-CM | POA: Diagnosis not present

## 2023-09-13 DIAGNOSIS — I10 Essential (primary) hypertension: Secondary | ICD-10-CM

## 2023-09-13 DIAGNOSIS — Z7984 Long term (current) use of oral hypoglycemic drugs: Secondary | ICD-10-CM | POA: Diagnosis not present

## 2023-09-13 DIAGNOSIS — E119 Type 2 diabetes mellitus without complications: Secondary | ICD-10-CM | POA: Diagnosis not present

## 2023-09-13 DIAGNOSIS — R29818 Other symptoms and signs involving the nervous system: Secondary | ICD-10-CM | POA: Diagnosis not present

## 2023-09-13 DIAGNOSIS — I63331 Cerebral infarction due to thrombosis of right posterior cerebral artery: Secondary | ICD-10-CM | POA: Diagnosis not present

## 2023-09-13 DIAGNOSIS — I651 Occlusion and stenosis of basilar artery: Secondary | ICD-10-CM | POA: Diagnosis not present

## 2023-09-13 DIAGNOSIS — Z79899 Other long term (current) drug therapy: Secondary | ICD-10-CM | POA: Diagnosis not present

## 2023-09-13 LAB — BASIC METABOLIC PANEL
Anion gap: 8 (ref 5–15)
BUN: 20 mg/dL (ref 8–23)
CO2: 27 mmol/L (ref 22–32)
Calcium: 9 mg/dL (ref 8.9–10.3)
Chloride: 103 mmol/L (ref 98–111)
Creatinine, Ser: 1.07 mg/dL — ABNORMAL HIGH (ref 0.44–1.00)
GFR, Estimated: 57 mL/min — ABNORMAL LOW (ref >60)
Glucose, Bld: 166 mg/dL — ABNORMAL HIGH (ref 70–99)
Potassium: 3.7 mmol/L (ref 3.5–5.1)
Sodium: 138 mmol/L (ref 135–145)

## 2023-09-13 LAB — ECHOCARDIOGRAM COMPLETE BUBBLE STUDY
AR max vel: 2.15 cm2
AV Area VTI: 2.31 cm2
AV Area mean vel: 2.04 cm2
AV Mean grad: 4 mm[Hg]
AV Peak grad: 8.3 mm[Hg]
Ao pk vel: 1.44 m/s
Area-P 1/2: 5.46 cm2
S' Lateral: 2.6 cm

## 2023-09-13 LAB — GLUCOSE, CAPILLARY
Glucose-Capillary: 190 mg/dL — ABNORMAL HIGH (ref 70–99)
Glucose-Capillary: 180 mg/dL — ABNORMAL HIGH (ref 70–99)
Glucose-Capillary: 184 mg/dL — ABNORMAL HIGH (ref 70–99)
Glucose-Capillary: 214 mg/dL — ABNORMAL HIGH (ref 70–99)

## 2023-09-13 LAB — LIPID PANEL
Cholesterol: 129 mg/dL (ref 0–200)
HDL: 37 mg/dL — ABNORMAL LOW (ref >40)
LDL Cholesterol: 68 mg/dL (ref 0–99)
Total CHOL/HDL Ratio: 3.5 {ratio}
Triglycerides: 122 mg/dL (ref <150)
VLDL: 24 mg/dL (ref 0–40)

## 2023-09-13 LAB — CBC
HCT: 29.7 % — ABNORMAL LOW (ref 36.0–46.0)
Hemoglobin: 9.7 g/dL — ABNORMAL LOW (ref 12.0–15.0)
MCH: 28.5 pg (ref 26.0–34.0)
MCHC: 32.7 g/dL (ref 30.0–36.0)
MCV: 87.4 fL (ref 80.0–100.0)
Platelets: 395 10*3/uL (ref 150–400)
RBC: 3.4 MIL/uL — ABNORMAL LOW (ref 3.87–5.11)
RDW: 15.8 % — ABNORMAL HIGH (ref 11.5–15.5)
WBC: 10 10*3/uL (ref 4.0–10.5)
nRBC: 0 % (ref 0.0–0.2)

## 2023-09-13 LAB — HIV ANTIBODY (ROUTINE TESTING W REFLEX): HIV Screen 4th Generation wRfx: NONREACTIVE

## 2023-09-13 LAB — HEMOGLOBIN A1C
Hgb A1c MFr Bld: 8.5 % — ABNORMAL HIGH (ref 4.8–5.6)
Mean Plasma Glucose: 197.25 mg/dL

## 2023-09-13 MED ORDER — STROKE: EARLY STAGES OF RECOVERY BOOK: Freq: Once | Status: AC

## 2023-09-13 MED ORDER — IOHEXOL 350 MG/ML SOLN
75.0000 mL | Freq: Once | INTRAVENOUS | Status: AC | PRN
  Administered 2023-09-13: 75 mL via INTRAVENOUS

## 2023-09-13 NOTE — Plan of Care (Signed)

## 2023-09-13 NOTE — Progress Notes (Signed)
Occupational Therapy Treatment Patient Details Name: Cheyenne Valenzuela MRN: [id number] DOB: 12/15/1955 Today's Date: 09/13/2023   History of present illness Pt is a 67 year old female who presents 09/12/23 after a week of left arm and leg weakness. 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 Pt verbalized understanding of them after I explained to her the ones to do. No further skilled OT, we will sign off.      If plan is discharge home, recommend the following:  A little help with bathing/dressing/bathroom;Assistance with cooking/housework;Assist for transportation   Equipment Recommendations  None recommended by OT       Precautions / Restrictions Precautions Precautions: Fall Restrictions Weight Bearing Restrictions: No       Mobility Bed Mobility Overal bed mobility: Independent                  Transfers Overall transfer level: Needs assistance   Transfers: Sit to/from Stand Sit to Stand: Supervision           General transfer comment: for just basic ambulation in her room she did show any issues with balance--higher level balance to be tested by PT     Balance Overall balance assessment: Mild deficits observed, not formally tested                                         ADL either performed or assessed with clinical judgement   ADL                                         General ADL Comments: overall at a S level due to left sided weakness    Extremity/Trunk Assessment Upper Extremity Assessment Upper Extremity Assessment: Left hand dominant;LUE deficits/detail LUE Deficits / Details: mild  decreased coordination when visually seen, but it is very apparant to patient due to she is left handed LUE Coordination: decreased fine motor;decreased gross motor            Vision Ability to See in Adequate Light: 2 Moderately impaired Patient Visual Report: No change from baseline Vision Assessment?: Yes Eye Alignment: Within Functional Limits Ocular Range of Motion: Within Functional Limits Alignment/Gaze Preference: Within Defined Limits Tracking/Visual Pursuits: Able to track stimulus in all quads without difficulty Convergence: Within functional limits Visual Fields: Left homonymous hemianopsia (pre-existing)          Cognition Arousal: Alert Behavior During Therapy: WFL for tasks assessed/performed Overall Cognitive Status: Within Functional Limits for tasks assessed                                          Exercises Other Exercises Other Exercises: Educated on FM and GM activities she can start on before HHOT starts seeing her. Gave her both sheets (FM/GM and theraputty). Also gave her red theraputty, clothes pins, a deck of cards and 4  round "coin pieces" for her to work on [employer]. She verbalized understanding.            Pertinent Vitals/ Pain       Pain Assessment Pain Assessment: No/denies pain  Home Living Family/patient expects to be discharged to:: Private residence Living Arrangements: Children Available Help at Discharge: Family;Available PRN/intermittently Type of Home: House Home Access: Stairs to enter [employer] of Steps: 3 Entrance Stairs-Rails: Right;Left;Can reach both Home Layout: Two level;Laundry or work area in basement;Able to live on main level with bedroom/bathroom     Bathroom Shower/Tub: Tub/shower unit;Door   [employer] Toilet: Standard         Additional Comments: No DME at home, works part time taking care of another person, dtr drives her where she needs to go (hasn't driven sink her prior  stroke where she lost left sided vision          Frequency  Min 1X/week        Progress Toward Goals  OT Goals(current goals can now be found in the care plan section)  Progress towards OT goals: Goals met/education completed, patient discharged from OT  Acute Rehab OT Goals Patient Stated Goal: to go home today         AM-PAC OT "6 Clicks" Daily Activity     Outcome Measure   Help from another person eating meals?: None Help from another person taking care of personal grooming?: A Little Help from another person toileting, which includes using toliet, bedpan, or urinal?: A Little Help from another person bathing (including washing, rinsing, drying)?: A Little Help from another person to put on and taking off regular upper body clothing?: A Little Help from another person to put on and taking off regular lower body clothing?: A Little 6 Click Score: 19    End of Session Equipment Utilized During Treatment: Gait belt  OT Visit Diagnosis: Other abnormalities of gait and mobility (R26.89);Muscle weakness (generalized) (M62.81);Hemiplegia and hemiparesis Hemiplegia - Right/Left: Left Hemiplegia - dominant/non-dominant: Non-Dominant   Activity Tolerance Patient tolerated treatment well   Patient Left in bed;with call bell/phone within reach;with bed alarm set;with family/visitor present           Time: 1400-1410 OT Time Calculation (min): 10 min  Charges: OT General Charges $OT Visit: 1 Visit OT Evaluation $OT Eval Moderate Complexity: 1 Mod OT Treatments $Therapeutic Activity: 8-22 mins  Lindon Romp OT Acute Rehabilitation Services Office [phone]    Cheyenne Valenzuela 09/13/2023, 3:17 PM

## 2023-09-13 NOTE — Consult Note (Signed)
NEUROLOGY CONSULT NOTE   Date of service: September 13, 2023 Patient Name: Cheyenne Valenzuela MRN:  [id number] DOB:  [birth year] Chief Complaint: "strokes on MRI Brain" Requesting Provider: Mercie Eon, MD  History of Present Illness  Cheyenne Valenzuela is a 67 y.o. female with hx of HLD, HTN, prior R PCA Stroke with residual L hemianopsia, afibb on [facility] who presents with L arm and L leg weaknes and numbness.   Symptoms started as numbness in L digits and L leg about a week and a half ago. Saw a neurology and felt to be radiculopathy and scheduled for a EMG/NCS in feb. She reports worsening symptoms with dragging her L leg for the last week and about 2-3 days of slurred speech which her family noted. She initially declined to come to the ED but on the family's insistence, came to the ED where MRI brain demonstrated emoblic appearing infarcts.  Patient reports that she is compliant with her Eliquis and takes 5mg  BID. Upon review of medication fill history, it seems like she has not been consistently filling her Eliquis.  She got about 30-day supply of Eliquis back in middle of October 2024.  Reviewing medication fill history prior to that shows that over the last 6 months, she has only filled about 3 months worth of Eliquis.  She reports that she gets her Eliquis filled at [facility] and does not go to any other pharmacy.  She has not been getting any free samples of Eliquis and she is not a [facility] patient.  She initially claims that she thought she was supposed to be on once a day of Eliquis when she started and then was switched over to twice a day back in April.  I discussed with her that this still does not explain the noted discrepancy in the fill history.  LKW: 09/03/23 Modified rankin score: 0-Completely asymptomatic and back to baseline post- stroke IV Thrombolysis: Not offered, she is outside the window.   EVT: Not offered, she is outside the window.    NIHSS components Score: Comment  1a Level of  Conscious 0[x]  1[]  2[]  3[]      1b LOC Questions 0[x]  1[]  2[]       1c LOC Commands 0[x]  1[]  2[]       2 Best Gaze 0[x]  1[]  2[]       3 Visual 0[]  1[]  2[x]  3[]      4 Facial Palsy 0[x]  1[]  2[]  3[]      5a Motor Arm - left 0[x]  1[]  2[]  3[]  4[]  UN[]    5b Motor Arm - Right 0[x]  1[]  2[]  3[]  4[]  UN[]    6a Motor Leg - Left 0[x]  1[]  2[]  3[]  4[]  UN[]    6b Motor Leg - Right 0[x]  1[]  2[]  3[]  4[]  UN[]    7 Limb Ataxia 0[x]  1[]  2[]  3[]  UN[]     8 Sensory 0[x]  1[]  2[]  UN[]      9 Best Language 0[x]  1[]  2[]  3[]      10 Dysarthria 0[]  1[x]  2[]  UN[]      11 Extinct. and Inattention 0[x]  1[]  2[]       TOTAL: 3      ROS  Comprehensive ROS performed and pertinent positives documented in HPI   Past History   Past Medical History:  Diagnosis Date   Cough 06/24/2021   Hypercholesteremia    Hypertension    Influenza A 11/21/2021   Stroke [facility])     Past Surgical History:  Procedure Laterality Date   ABDOMINAL HYSTERECTOMY     APPENDECTOMY  Family History: Family History  Problem Relation Age of Onset   Diabetes Mother    Diabetes Sister     Social History  reports that she has never smoked. She has never been exposed to tobacco smoke. She has never used smokeless tobacco. She reports that she does not drink alcohol and does not use drugs.  Allergies  Allergen Reactions   Lisinopril Swelling    Medications   Current Facility-Administered Medications:    amLODipine (NORVASC) tablet 10 mg, 10 mg, Oral, Daily, Rocky Morel, DO   apixaban Everlene Balls) tablet 5 mg, 5 mg, Oral, BID, Rocky Morel, DO, 5 mg at 09/12/23 2154   atorvastatin (LIPITOR) tablet 80 mg, 80 mg, Oral, Daily, Rocky Morel, DO   carvedilol (COREG) tablet 12.5 mg, 12.5 mg, Oral, BID WC, Rocky Morel, DO   empagliflozin (JARDIANCE) tablet 25 mg, 25 mg, Oral, QAC breakfast, Rocky Morel, DO   ezetimibe (ZETIA) tablet 10 mg, 10 mg, Oral, Daily, Rocky Morel, DO   insulin aspart (novoLOG) injection 0-15 Units,  0-15 Units, Subcutaneous, TID WC, Rocky Morel, DO   insulin detemir (LEVEMIR) injection 10 Units, 10 Units, Subcutaneous, Daily, Rocky Morel, DO   losartan (COZAAR) tablet 100 mg, 100 mg, Oral, Daily, Rocky Morel, DO   spironolactone (ALDACTONE) tablet 25 mg, 25 mg, Oral, Daily, Rocky Morel, DO  Vitals   Vitals:   09/12/23 2042 09/12/23 2043 09/12/23 2103 09/12/23 2334  BP:  (!) 191/88 (!) 126/115 (!) 162/76  Pulse:  89 80 72  Resp:  16 18 18   Temp: 98.4 F (36.9 C)  98 F (36.7 C) 98.7 F (37.1 C)  TempSrc: Oral  Oral Oral  SpO2:  100% 97% 97%  Weight:      Height:        Body mass index is 31 kg/m.  Physical Exam   Physical Exam   General: Laying comfortably in bed; in no acute distress.  HENT: Normal oropharynx and mucosa. Normal external appearance of ears and nose.  Neck: Supple, no pain or tenderness  CV: No JVD. No peripheral edema.  Pulmonary: Symmetric Chest rise. Normal respiratory effort.  Abdomen: Soft to touch, non-tender.  Ext: No cyanosis, edema, or deformity  Skin: No rash. Normal palpation of skin.   Musculoskeletal: Normal digits and nails by inspection. No clubbing.   Neurologic Examination  Mental status/Cognition: Alert, oriented to self, place, month and year, good attention.  Speech/language: dysarthric, fluent, comprehension intact, object naming intact, repetition intact.  Cranial nerves:   CN II Pupils equal and reactive to light, left hemianopsia   CN III,IV,VI EOM intact, no gaze preference or deviation, no nystagmus    CN V normal sensation in V1, V2, and V3 segments bilaterally    CN VII no asymmetry, no nasolabial fold flattening    CN VIII normal hearing to speech    CN IX & X normal palatal elevation, no uvular deviation    CN XI 5/5 head turn and 5/5 shoulder shrug bilaterally    CN XII midline tongue protrusion    Motor:  Muscle bulk: normal, tone normal, pronator drift none tremor none Mvmt Root Nerve  Muscle  Right Left Comments  SA C5/6 Ax Deltoid 5 5   EF C5/6 Mc Biceps 5 5   EE C6/7/8 Rad Triceps 5 5   WF C6/7 Med FCR     WE C7/8 PIN ECU     F Ab C8/T1 U ADM/FDI 5 4   HF L1/2/3 Fem Illopsoas  5 4   KE L2/3/4 Fem Quad 5 5   DF L4/5 D Peron Tib Ant 5 5   PF S1/2 Tibial Grc/Sol 5 5    Sensation:  Light touch Intact throughout   Pin prick    Temperature    Vibration   Proprioception    Coordination/Complex Motor:  - Finger to Nose intact BL - Heel to shin slight reduced coordination in L leg. - Rapid alternating movement are slowed on the left - Gait: deferred for patient safety.  Labs/Imaging/Neurodiagnostic studies   CBC:  Recent Labs  Lab 2023/09/15 1253 2023/09/15 1308  WBC 11.3*  --   NEUTROABS 8.7*  --   HGB 10.6* 11.6*  HCT 32.9* 34.0*  MCV 89.6  --   PLT 445*  --    Basic Metabolic Panel:  Lab Results  Component Value Date   NA 141 09-15-2023   K 4.1 09/15/23   CO2 25 September 15, 2023   GLUCOSE 252 (H) 09/15/2023   BUN 23 09/15/23   CREATININE 1.10 (H) 2023-09-15   CALCIUM 9.5 Sep 15, 2023   GFRNONAA 53 (L) 2023-09-15   Lipid Panel:  Lab Results  Component Value Date   LDLCALC 52 07/26/2023   HgbA1c:  Lab Results  Component Value Date   HGBA1C 6.7 (A) 03/12/2023   Urine Drug Screen:     Component Value Date/Time   LABOPIA NONE DETECTED 09-15-2023 1253   COCAINSCRNUR NONE DETECTED 09/15/2023 1253   LABBENZ NONE DETECTED September 15, 2023 1253   AMPHETMU NONE DETECTED 09-15-2023 1253   THCU NONE DETECTED 09/15/2023 1253   LABBARB NONE DETECTED 15-Sep-2023 1253    Alcohol Level     Component Value Date/Time   ETH <10 September 15, 2023 1253   INR  Lab Results  Component Value Date   INR 1.3 (H) 09-15-23   APTT  Lab Results  Component Value Date   APTT 32 09-15-23   AED levels: No results found for: "PHENYTOIN", "ZONISAMIDE", "LAMOTRIGINE", "LEVETIRACETA"  CT Head without contrast(Personally reviewed): 1. Hypodensity in the superior left cerebellum is  new from the prior exam but technically age indeterminate. MRI is recommended for further evaluation. 2. Redemonstrated remote right PCA territory, right cerebellar, and right thalamic infarcts.  CT angio Head and Neck with contrast: pending  MRI Brain(Personally reviewed): 1. Numerous small acute infarctions scattered within both cerebellar hemispheres. Acute infarction affects the mesial temporal lobe focally on the right. Focal acute infarctions are present within the right thalamus. Punctate acute infarction present within the left thalamus. Findings are consistent with recurrent posterior circulation infarctions. 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 2. Old infarctions of the cerebellum, right occipital lobe, right thalamus and right posteromedial temporal lobe.  ASSESSMENT   Kynzee Terzian is a 67 y.o. female with hx of HLD, HTN, prior R PCA Stroke with residual L hemianopsia, afibb on [facility] who presents with L arm and L leg weaknes and numbness and slurred speech. Found to have embolic appearing strokes, mostly in posterior circulation. Upon further review, has not been taking eliquis consistently and fill hx shows that she has only filled about 3months worth of eliquis over the last 6 months.  I stressed the importance of compliance with eliquis and also discussed coming to the hospital at the first sign of stroke.  RECOMMENDATIONS  - Frequent Neuro checks per stroke unit protocol - Recommend Vascular imaging with CTA head  and neck - Recommend obtaining Lipid panel with LDL - Please start statin if LDL > 70 - Recommend HbA1c to evaluate for diabetes and how well it is controlled. - continue home eliquis. - Recommend DVT ppx - SBP goal - aim for gradual normotension. - Recommend Telemetry monitoring for arrythmia - Recommend bedside swallow screen prior  to PO intake. - Stroke education booklet - Recommend PT/OT/SLP consult  ______________________________________________________________________    Signed, Erick Blinks, MD [facility]

## 2023-09-13 NOTE — Progress Notes (Addendum)
STROKE TEAM PROGRESS NOTE   BRIEF HPI [name] is a 67 y.o. 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   Symptoms started as numbness in L digits and L leg about a week and a half ago. Saw a neurology and felt to be radiculopathy and scheduled for a EMG/NCS in feb. She reports worsening symptoms with dragging her L leg for the last week and about 2-3 days of slurred speech which her family noted. She initially declined to come to the ED but on the family's insistence, came to the ED where MRI brain demonstrated emoblic appearing infarcts.   NIH on Admission 3   SIGNIFICANT HOSPITAL EVENTS 12/4 to ED for evaluation of ongoing symptoms. MRI brain demonstrated emoblic appearing infarcts.  INTERIM HISTORY/SUBJECTIVE Family at the bedside.  Patient states that she has not missed any doses of her Eliquis and takes it as prescribed. Patient states she still has some left lower leg weakness and tingling on the left foot and hand  CTA head and neck is ordered  OBJECTIVE  CBC    Component Value Date/Time   WBC 10.0 09/13/2023 0455   RBC 3.40 (L) 09/13/2023 0455   HGB 9.7 (L) 09/13/2023 0455   HCT 29.7 (L) 09/13/2023 0455   PLT 395 09/13/2023 0455   MCV 87.4 09/13/2023 0455   MCH 28.5 09/13/2023 0455   MCHC 32.7 09/13/2023 0455   RDW 15.8 (H) 09/13/2023 0455   LYMPHSABS 1.6 09/12/2023 1253   MONOABS 0.8 09/12/2023 1253   EOSABS 0.1 09/12/2023 1253   BASOSABS 0.1 09/12/2023 1253    BMET    Component Value Date/Time   NA 138 09/13/2023 0455   NA 142 12/19/2022 1623   K 3.7 09/13/2023 0455   CL 103 09/13/2023 0455   CO2 27 09/13/2023 0455   GLUCOSE 166 (H) 09/13/2023 0455   BUN 20 09/13/2023 0455   BUN 14 12/19/2022 1623   CREATININE 1.07 (H) 09/13/2023 0455   CALCIUM 9.0 09/13/2023 0455   EGFR 69 12/19/2022 1623   GFRNONAA 57 (L) 09/13/2023 0455    IMAGING past 24  hours MR BRAIN WO CONTRAST  Result Date: 09/12/2023 CLINICAL DATA:  Neuro deficit, acute, stroke suspected. Left lower extremity numbness and weakness. EXAM: MRI HEAD WITHOUT CONTRAST TECHNIQUE: Multiplanar, multiecho pulse sequences of the brain and surrounding structures were obtained without intravenous contrast. COMPARISON:  Head CT same day. MRI 05/14/2023. CT angiography 02/03/2022 and 05/14/2023. FINDINGS: Brain: Diffusion imaging shows numerous small acute infarctions scattered within both cerebellar hemispheres. Acute infarction affects the mesial temporal lobe focally on the right. Focal infarctions are present within the right thalamus. Punctate acute infarction present within the left thalamus. No other supra tentorial acute insult. Old infarctions are noted affecting the cerebellum as well. Old infarction in the right occipital lobe and posteromedial temporal lobe. Old infarctions of the right thalamus. Mild chronic small-vessel ischemic change otherwise affecting the cerebral hemispheric white matter. Findings are consistent with recurrent posterior circulation infarctions. 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 Vascular: Major vessels at the base of the brain show flow. Skull and upper cervical spine: Negative Sinuses/Orbits: Clear/normal Other: None IMPRESSION: 1. Numerous small acute infarctions scattered within both cerebellar hemispheres. Acute infarction affects the mesial temporal lobe focally  on the right. Focal acute infarctions are present within the right thalamus. Punctate acute infarction present within the left thalamus. Findings are consistent with recurrent posterior circulation infarctions. 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 2.  Old infarctions of the cerebellum, right occipital lobe, right thalamus and right posteromedial temporal lobe. Electronically Signed   By: Paulina Fusi M.D.   On: 09/12/2023 18:01    Vitals:   09/13/23 0334 09/13/23 0517 09/13/23 0749 09/13/23 1209  BP: (!) 145/75  (!) 163/79 (!) 155/71  Pulse: 72  68 70  Resp: 18  17 17   Temp: 98.1 F (36.7 C)  97.7 F (36.5 C) 97.8 F (36.6 C)  TempSrc: Oral  Oral Oral  SpO2: 98%  99% 97%  Weight:  82.3 kg    Height:         PHYSICAL EXAM General:  Alert, well-nourished, well-developed patient in no acute distress Psych:  Mood and affect appropriate for situation CV: Regular rate and rhythm on monitor Respiratory:  Regular, unlabored respirations on room air GI: Abdomen soft and nontender   NEURO:  Mental Status: AA&Ox3, patient is able to give clear and coherent history.  No dysarthria Speech/Language: speech is without  aphasia.  Naming, repetition, fluency, and comprehension intact.  Cranial Nerves:  II: PERRL. Visual fields with left hemianopia III, IV, VI: EOMI. Eyelids elevate symmetrically.  V: Sensation is intact to light touch and symmetrical to face.  VII: Face is symmetrical resting and smiling VIII: hearing intact to voice. IX, X: Palate elevates symmetrically. Phonation is normal.  AO:ZHYQMVHQ shrug 5/5. XII: tongue is midline without fasciculations. Motor: 5/5 strength to all muscle groups tested.  Tone: is normal and bulk is normal Sensation-decreased sensation on the left Coordination: FTN intact bilaterally, HKS: no ataxia in BLE.No drift.  Gait- deferred  Most Recent NIH  1a Level of Conscious.: 0 1b LOC Questions: 0 1c LOC Commands: 0 2 Best Gaze: 0 3 Visual: 2 4 Facial Palsy: 0 5a Motor Arm - left: 0 5b Motor Arm - Right: 0 6a Motor Leg - Left: 0 6b Motor Leg - Right: 0 7 Limb Ataxia: 0 8 Sensory: 1 9 Best Language: 0 10 Dysarthria: 1 11 Extinct. and Inatten.: 0 TOTAL: 4   ASSESSMENT/PLAN  Acute  Ischemic Infarct:  bilateral scattered posterior circulation  Etiology: Cardioembolic in the setting of A-fib on Eliquis with questionable compliance as well as severe posterior circulation atherosclerosis CT head Hypodensity in the superior left cerebellum  CTA head & neck new right PCA occlusion in P1 segment.  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  Unchanged moderate stenosis of left vertebral artery origin. MRI  Numerous small acute infarctions scattered within both cerebellar hemispheres. Acute infarction affects the mesial temporal lobe focally on the right. Focal acute infarctions are present within the right thalamus. Punctate acute infarction present within the left thalamus  2D Echo ejection fraction 60 to 65%.  Moderate dilatation of left atrium.  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  Atrial fibrillation Home Meds: Eliquis Continue telemetry monitoring Continue anticoagulation with Eliquis  Hypertension Home meds: Amlodipine 10 mg, Coreg 12.5 mg, losartan 100 mg spironolactone  25 mg Stable Blood Pressure Goal: SBP less than 160   Hyperlipidemia Home meds: Atorvastatin 80 mg and Zetia and Repatha, resumed in hospital LDL 68, goal < 70 Continue statin at discharge  Diabetes type II UnControlled Home meds: Trulicity metformin Jardiance HgbA1c 8.5, goal < 7.0 CBGs SSI Recommend close follow-up with PCP for better DM control  Dysphagia Patient has post-stroke dysphagia, SLP consulted    Diet   Diet Carb Modified Fluid consistency: Thin; Room service appropriate? Yes   Advance diet as tolerated  Other Stroke Risk Factors  ETOH use, alcohol level <10, advised to drink no more than 1 drink(s) a day  Obesity,  Body mass index is 32.14 kg/m., BMI >/= 30 associated with increased stroke risk, recommend weight loss, diet and exercise as appropriate    Other Active Problems   Hospital day # 0  Gevena Mart DNP, ACNPC-AG  [facility]  I have personally obtained history,examined this patient, reviewed notes, independently viewed imaging studies, participated in medical decision making and plan of care.ROS completed by me personally and pertinent positives fully documented  I have made any additions or clarifications directly to the above note. Agree with note above.  Patient with known history of A-fib and old right PCA infarct on long-term Eliquis who states she was quite compliant with taking it.  MRI shows multiple small posterior circulation embolic infarcts.  Long discussion patient with regards to alternatives to Eliquis for anticoagulation but lack of definitive data suggesting superiority of of switching to Pradaxa or Xarelto versus staying on Eliquis.  Continue ongoing stroke workup.  Continue Eliquis for stroke prevention.  Aggressive risk factor modification.  Long discussion with patient and care team and answered questions.  Greater than 50% time during this 50-minute visit was spent in counseling and coordination of care about her embolic strokes and atrial fibrillation and risk-benefit of anticoagulation discussion about alternatives and lack of definitive data superiority Stroke team will sign off.  Kindly call for questions. Delia Heady, MD Medical Director [facility] Pager: [phone] 09/13/2023 3:50 PM   To contact Stroke Continuity provider, please refer to [email or web address]. After hours, contact General Neurology

## 2023-09-13 NOTE — Progress Notes (Signed)
Summary: Cheyenne Valenzuela is a 67 yo with history of right PCA stroke with residual left homonymous hemianopsia, T2DM on insulin, HTN, and atrial fibrillation on anticoagulation who presents after a week of left arm and leg weakness. She was admitted to the Internal Medicine Teaching Service on 12/4 for stroke workup.   Subjective:  Day #1. No overnight events reported.   Patient's friend, Mr. Langston Masker, is at bedside. Patient states she feels well. Denies any concerns with her speech or swallowing. Does not think she has a facial droop at this time. Per Mr. Langston Masker, facial droop was evident at the beginning of the week but seems to be better now. Patient does endorse weakness on her left side especially her legs with walking, feels unsteady. Denies any falls. Patient with good understanding after her conversation with neurology. Denies issues with eating or voiding. Discussed next steps including need for PT/OT/SLP evaluation.   Objective:  Vital signs in last 24 hours: Vitals:   09/13/23 0334 09/13/23 0517 09/13/23 0749 09/13/23 1209  BP: (!) 145/75  (!) 163/79 (!) 155/71  Pulse: 72  68 70  Resp: 18  17 17   Temp: 98.1 F (36.7 C)  97.7 F (36.5 C) 97.8 F (36.6 C)  TempSrc: Oral  Oral Oral  SpO2: 98%  99% 97%  Weight:  82.3 kg    Height:          Latest Ref Rng & Units 09/13/2023    4:55 AM 09/12/2023    1:08 PM 09/12/2023   12:53 PM  CBC  WBC 4.0 - 10.5 K/uL 10.0   11.3   Hemoglobin 12.0 - 15.0 g/dL 9.7  29.5  62.1   Hematocrit 36.0 - 46.0 % 29.7  34.0  32.9   Platelets 150 - 400 K/uL 395   445        Latest Ref Rng & Units 09/13/2023    4:55 AM 09/12/2023    1:08 PM 09/12/2023   12:53 PM  BMP  Glucose 70 - 99 mg/dL 308  657  846   BUN 8 - 23 mg/dL 20  23  20    Creatinine 0.44 - 1.00 mg/dL 9.62  9.52  8.41   Sodium 135 - 145 mmol/L 138  141  137   Potassium 3.5 - 5.1 mmol/L 3.7  4.1  4.0   Chloride 98 - 111 mmol/L 103  102  101   CO2 22 - 32 mmol/L 27   25   Calcium 8.9 -  10.3 mg/dL 9.0   9.5     HIV screen non-reactive Hgb A1c 8.5  Lipid panel HDL 37, LDL 68  Physical Exam Constitutional: Patient is sitting on the edge of the bed in no acute distress, conversing appropriately.  CV: Regular rate and rhythm without murmurs on auscultation. No LE edema.  Pulmonary/Respiratory: Normal respiratory effort on room air.  Neuro: Alert and oriented to name, place, situation, and date. Bilateral upper and lower extremity strength 5/5, sensation intact. Patient with facial symmetry, minimally dysarthric speech that is understandable by unfamiliar listeners. Gait not assessed due patient stating she can feel off balance with standing and ambulation, will wait for PT to evaluate.  Skin: Warm and dry. Psych: Normal mood, flat affect.   Assessment/Plan:  Principal Problem:   Stroke [facility])  Gal Chmiel is a 67 yo with history of right PCA stroke with residual left homonymous hemianopsia, T2DM on insulin, HTN, and atrial fibrillation on anticoagulation who presents after a week  of left arm and leg weakness.   Subacute left cerebellar infarct Patient with CT and MRI brain concerning for stroke that correlates with deficits. CTA head and neck completed today, final report pending. Echo to be completed today. Patient with stable deficits on physical exam. Patient has expressed understanding regarding the importance of taking her Eliquis BID consistently, as well as her other medications. OT has evaluated patient, PT/SLP evaluations pending.  Plan:  - Continue atorvastatin 100 mg daily, Zetia 10 mg daily, amlodipine 10 mg daily, carvedilol 12.5 BID with meals, losartan 100 mg daily, spironolactone 25 mg daily, Eliquis 5 mg BID, and Jardiance 25 mg daily  - Appreciate neurology input - PT, SLP evals pending  - Follow-up CTA head and neck and Echo final reports    Urinary urgency Continues to deny any issues. Will continue to monitor and if there are any signs of infection will  order repeat UA.    T2DM Hgb A1c on admission 8.5%, previous 6.7% on 03/12/2023.  Home regimen includes Lantus 10 units every morning, metformin 1000 mg twice daily, and Trulicity 0.75 mg weekly. Will need outpatient follow up for improved control.  - Lantus 10 units daily and moderate SSI   Hypertension Blood pressures here 120s-190s/79s-110s.  Patient outside the window for permissive hypertension. Resumed home antihypertensives 12/5. Will monitor and adjust as needed.  - Continue amlodipine 10 mg daily, carvedilol 12.5 mg BID, losartan 100 mg daily, and spironolactone 25 mg daily    Atrial fibrillation Stable, resumed home anticoagulation and beta blocker.  - Continue Eliquis 5 mg BID and carvedilol 12.5 mg BID with meals    Diet: Carb-Modified VTE: DOAC IVF: None, Code: Full  Prior to Admission Living Arrangement: Home Anticipated Discharge Location: Pending PT/OT/SLP evaluation Barriers to Discharge: Medical management  Dispo: Anticipated discharge in approximately 1-2 day(s).   Philomena Doheny, MD, PGY-1 09/13/2023, 3:35 PM Pager: [phone] After 5pm on weekdays and 1pm on weekends: On Call pager [phone]

## 2023-09-13 NOTE — Evaluation (Signed)
Occupational Therapy Evaluation Patient Details Name: Cheyenne Valenzuela MRN: [id number] DOB: [birth year] Today's Date: 09/13/2023   History of Present Illness Pt is a 67 year old female who presents 09/12/23 after a week of left arm and leg weakness. NIHSS 3; MRI brain numerous small acute infarctions bil cerebellum, R temporal lobe, R and L thalamus,  PMH- right PCA stroke with residual left homonymous hemianopsia, T2DM on insulin, HTN, and atrial fibrillation on anticoagulation   Clinical Impression   This 67 yo female admitted with above presents to acute OT with PLOF of being independent with basic ADLs and IADLs but not driving due to pre-existing homonymous hemianopsia. Currently she has mild LUE coordination issues and decreased LLE strength but does manage to get around hospital room without issues and without AD. She will benefit from one more session of acute OT to start work on LUE and then [facility] is recommended.      If plan is discharge home, recommend the following: A little help with bathing/dressing/bathroom;Assistance with cooking/housework;Assist for transportation    Functional Status Assessment  Patient has had a recent decline in their functional status and demonstrates the ability to make significant improvements in function in a reasonable and predictable amount of time.  Equipment Recommendations  None recommended by OT       Precautions / Restrictions Precautions Precautions: Fall Restrictions Weight Bearing Restrictions: No      Mobility Bed Mobility Overal bed mobility: Independent                  Transfers Overall transfer level: Needs assistance   Transfers: Sit to/from Stand Sit to Stand: Supervision           General transfer comment: for just basic ambulation in her room she did show any issues with balance--higher level balance to be tested by PT      Balance Overall balance assessment: Mild deficits observed, not formally tested                                          ADL either performed or assessed with clinical judgement   ADL                                         General ADL Comments: overall at a S level due to left sided weakness     Vision Ability to See in Adequate Light: 2 Moderately impaired Patient Visual Report: No change from baseline Vision Assessment?: Yes Eye Alignment: Within Functional Limits Ocular Range of Motion: Within Functional Limits Alignment/Gaze Preference: Within Defined Limits Tracking/Visual Pursuits: Able to track stimulus in all quads without difficulty Convergence: Within functional limits Visual Fields: Left homonymous hemianopsia (pre-existing)            Pertinent Vitals/Pain Pain Assessment Pain Assessment: No/denies pain     Extremity/Trunk Assessment Upper Extremity Assessment Upper Extremity Assessment: Left hand dominant;LUE deficits/detail LUE Deficits / Details: mild decreased coordination when visually seen, but it is very apparant to patient due to she is left handed LUE Coordination: decreased fine motor;decreased gross motor           Communication Communication Communication: No apparent difficulties   Cognition Arousal: Alert Behavior During Therapy: WFL for tasks assessed/performed Overall Cognitive Status: Within Functional Limits for tasks assessed  Home Living Family/patient expects to be discharged to:: Private residence Living Arrangements: Children Available Help at Discharge: Family;Available PRN/intermittently Type of Home: House Home Access: Stairs to enter [employer] of Steps: 3 Entrance Stairs-Rails: Right;Left;Can reach both Home Layout: Two level;Laundry or work area in basement;Able to live on main level with bedroom/bathroom     Bathroom Shower/Tub: Tub/shower unit;Door   [employer] Toilet: Standard          Additional Comments: No DME at home, works part time taking care of another person, dtr drives her where she needs to go (hasn't driven sink her prior stroke where she lost left sided vision      Prior Functioning/Environment Prior Level of Function : Independent/Modified Independent                        OT Problem List: Impaired balance (sitting and/or standing);Decreased coordination;Impaired UE functional use      OT Treatment/Interventions: Self-care/ADL training;Therapeutic exercise;Therapeutic activities;Patient/family education    OT Goals(Current goals can be found in the care plan section) Acute Rehab OT Goals Patient Stated Goal: to go home today  OT Frequency: Min 1X/week       AM-PAC OT "6 Clicks" Daily Activity     Outcome Measure Help from another person eating meals?: None Help from another person taking care of personal grooming?: A Little Help from another person toileting, which includes using toliet, bedpan, or urinal?: A Little Help from another person bathing (including washing, rinsing, drying)?: A Little Help from another person to put on and taking off regular upper body clothing?: A Little Help from another person to put on and taking off regular lower body clothing?: A Little 6 Click Score: 19   End of Session Equipment Utilized During Treatment: Gait belt  Activity Tolerance: Patient tolerated treatment well Patient left: in bed;with call bell/phone within reach;with bed alarm set;with family/visitor present  OT Visit Diagnosis: Other abnormalities of gait and mobility (R26.89);Muscle weakness (generalized) (M62.81);Hemiplegia and hemiparesis Hemiplegia - Right/Left: Left Hemiplegia - dominant/non-dominant: Non-Dominant                Time: 5784-6962 OT Time Calculation (min): 18 min Charges:  OT General Charges $OT Visit: 1 Visit OT Evaluation $OT Eval Moderate Complexity: 1 Mod Cathy L. OT Acute Rehabilitation Services Office  [phone]    Evette Georges 09/13/2023, 3:12 PM

## 2023-09-13 NOTE — TOC Initial Note (Signed)
Transition of Care [facility]) - Initial/Assessment Note    Patient Details  Name: Cheyenne Valenzuela MRN: [id number] Date of Birth: 09-07-1956  Transition of [facility]) CM/SW Contact:    Kermit Balo, RN Phone Number: 09/13/2023, 11:19 AM  Clinical Narrative:                  Pt is from home with her daughter that can provide supervision/ assist at home. No DME.  Pt denies issues with transportation. She also denies any issues with home medications. Awaiting therapy evals.  TOC following.  Expected Discharge Plan: Home/Self Care Barriers to Discharge: Continued Medical Work up   Patient Goals and CMS Choice            Expected Discharge Plan and Services   Discharge Planning Services: CM Consult   Living arrangements for the past 2 months: Single Family Home                                      Prior Living Arrangements/Services Living arrangements for the past 2 months: Single Family Home Lives with:: Adult Children Patient language and need for interpreter reviewed:: Yes Do you feel safe going back to the place where you live?: Yes        Care giver support system in place?: Yes (comment)   Criminal Activity/Legal Involvement Pertinent to Current Situation/Hospitalization: No - Comment as needed  Activities of Daily Living   ADL Screening (condition at time of admission) Independently performs ADLs?: Yes (appropriate for developmental age) Is the patient deaf or have difficulty hearing?: No Does the patient have difficulty seeing, even when wearing glasses/contacts?: No Does the patient have difficulty concentrating, remembering, or making decisions?: No  Permission Sought/Granted                  Emotional Assessment Appearance:: Appears stated age Attitude/Demeanor/Rapport: Engaged Affect (typically observed): Accepting Orientation: : Oriented to Self, Oriented to Place, Oriented to  Time, Oriented to Situation   Psych Involvement: No  (comment)  Admission diagnosis:  Weakness [R53.1] Stroke [facility]) [I63.9] Patient Active Problem List   Diagnosis Date Noted   Stroke [facility]) 09/12/2023   Right arm pain 11/27/2022   Cataract 10/26/2022   Weight loss 08/15/2022   Atrial fibrillation (HCC) 05/12/2022   Hx of ischemic right PCA stroke 02/09/2022   Healthcare maintenance 02/04/2021   Type 2 diabetes mellitus with hyperlipidemia (HCC) 02/04/2021   Intracranial atherosclerosis 02/04/2021   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   Hyperlipidemia 01/23/2021   Migraine headache 01/23/2021   PCP:  Carmina Miller, DO Pharmacy:   [id number] - [place], [place] - [address] [place] [address] Phone: [phone] Fax: [phone]     Social Determinants of Health (SDOH) Social History: SDOH Screenings   Food Insecurity: No Food Insecurity (09/12/2023)  Housing: Low Risk  (09/12/2023)  Transportation Needs: No Transportation Needs (09/12/2023)  Utilities: Not At Risk (09/12/2023)  Alcohol Screen: Low Risk  (07/04/2023)  Depression (PHQ2-9): Low Risk  (07/04/2023)  Financial Resource Strain: Low Risk  (07/04/2023)  Physical Activity: Insufficiently Active (07/04/2023)  Social Connections: Moderately Integrated (07/04/2023)  Stress: No Stress Concern Present (07/04/2023)  Tobacco Use: Low Risk  (09/12/2023)  Recent Concern: Tobacco Use - Medium Risk (08/30/2023)   Received from [facility]  Health Literacy: Adequate Health  Literacy (07/04/2023)   SDOH Interventions:     Readmission Risk Interventions     No data to display

## 2023-09-13 NOTE — Progress Notes (Signed)
PT Cancellation Note  Patient Details Name: Cheyenne Valenzuela MRN: [id number] DOB: November 17, 1955   Cancelled Treatment:    Reason Eval/Treat Not Completed: Patient at procedure or test/unavailable  Patient off the floor.    Jerolyn Center, PT Acute Rehabilitation Services  Office [phone]  Zena Amos 09/13/2023, 3:35 PM

## 2023-09-14 ENCOUNTER — Other Ambulatory Visit (HOSPITAL_COMMUNITY): Payer: Self-pay

## 2023-09-14 DIAGNOSIS — E1169 Type 2 diabetes mellitus with other specified complication: Secondary | ICD-10-CM | POA: Diagnosis not present

## 2023-09-14 DIAGNOSIS — Z7984 Long term (current) use of oral hypoglycemic drugs: Secondary | ICD-10-CM | POA: Diagnosis not present

## 2023-09-14 DIAGNOSIS — I6349 Cerebral infarction due to embolism of other cerebral artery: Secondary | ICD-10-CM

## 2023-09-14 DIAGNOSIS — E785 Hyperlipidemia, unspecified: Secondary | ICD-10-CM | POA: Diagnosis not present

## 2023-09-14 DIAGNOSIS — I1A0 Resistant hypertension: Secondary | ICD-10-CM | POA: Diagnosis not present

## 2023-09-14 DIAGNOSIS — I639 Cerebral infarction, unspecified: Secondary | ICD-10-CM | POA: Diagnosis not present

## 2023-09-14 LAB — CBC
HCT: 31 % — ABNORMAL LOW (ref 36.0–46.0)
Hemoglobin: 10.1 g/dL — ABNORMAL LOW (ref 12.0–15.0)
MCH: 28.3 pg (ref 26.0–34.0)
MCHC: 32.6 g/dL (ref 30.0–36.0)
MCV: 86.8 fL (ref 80.0–100.0)
Platelets: 388 10*3/uL (ref 150–400)
RBC: 3.57 MIL/uL — ABNORMAL LOW (ref 3.87–5.11)
RDW: 15.8 % — ABNORMAL HIGH (ref 11.5–15.5)
WBC: 11.1 10*3/uL — ABNORMAL HIGH (ref 4.0–10.5)
nRBC: 0 % (ref 0.0–0.2)

## 2023-09-14 LAB — GLUCOSE, CAPILLARY
Glucose-Capillary: 193 mg/dL — ABNORMAL HIGH (ref 70–99)
Glucose-Capillary: 177 mg/dL — ABNORMAL HIGH (ref 70–99)

## 2023-09-14 LAB — BASIC METABOLIC PANEL
Anion gap: 11 (ref 5–15)
BUN: 31 mg/dL — ABNORMAL HIGH (ref 8–23)
CO2: 25 mmol/L (ref 22–32)
Calcium: 9.2 mg/dL (ref 8.9–10.3)
Chloride: 102 mmol/L (ref 98–111)
Creatinine, Ser: 1.33 mg/dL — ABNORMAL HIGH (ref 0.44–1.00)
GFR, Estimated: 44 mL/min — ABNORMAL LOW (ref >60)
Glucose, Bld: 215 mg/dL — ABNORMAL HIGH (ref 70–99)
Potassium: 3.8 mmol/L (ref 3.5–5.1)
Sodium: 138 mmol/L (ref 135–145)

## 2023-09-14 MED ORDER — AMLODIPINE BESYLATE 10 MG PO TABS
10.0000 mg | ORAL_TABLET | Freq: Every day | ORAL | 0 refills | Status: AC
  Filled 2023-09-14: qty 30, 30d supply, fill #0

## 2023-09-14 NOTE — TOC Transition Note (Signed)
Transition of Care [facility]) - CM/SW Discharge Note   Patient Details  Name: Cheyenne Valenzuela MRN: [id number] Date of Birth: [birth year]  Transition of Care [facility]) CM/SW Contact:  Kermit Balo, RN Phone Number: 09/14/2023, 11:01 AM   Clinical Narrative:     Pt is discharging home with outpatient therapy through [facility]. Information sent to [facility] and on AVS.  Walker delivered to the room per [employer].  Pt has transportation home.  Final next level of care: OP Rehab Barriers to Discharge: No Barriers Identified   Patient Goals and CMS Choice   Choice offered to / list presented to : Patient  Discharge Placement                         Discharge Plan and Services Additional resources added to the After Visit Summary for     Discharge Planning Services: CM Consult            DME Arranged: Dan Humphreys rolling DME Agency: [employer] Date DME Agency Contacted: 09/14/23   Representative spoke with at DME Agency: paper with d/c lounge            Social Determinants of Health (SDOH) Interventions SDOH Screenings   Food Insecurity: No Food Insecurity (09/12/2023)  Housing: Low Risk  (09/12/2023)  Transportation Needs: No Transportation Needs (09/12/2023)  Utilities: Not At Risk (09/12/2023)  Alcohol Screen: Low Risk  (07/04/2023)  Depression (PHQ2-9): Low Risk  (07/04/2023)  Financial Resource Strain: Low Risk  (07/04/2023)  Physical Activity: Insufficiently Active (07/04/2023)  Social Connections: Moderately Integrated (07/04/2023)  Stress: No Stress Concern Present (07/04/2023)  Tobacco Use: Low Risk  (09/12/2023)  Recent Concern: Tobacco Use - Medium Risk (08/30/2023)   Received from [facility]  Health Literacy: Adequate Health Literacy (07/04/2023)     Readmission Risk Interventions     No data to display

## 2023-09-14 NOTE — Care Management Obs Status (Signed)
MEDICARE OBSERVATION STATUS NOTIFICATION   Patient Details  Name: Cheyenne Valenzuela MRN: [id number] Date of Birth: 04-16-1956   Medicare Observation Status Notification Given:  Yes    Kermit Balo, RN 09/14/2023, 10:58 AM

## 2023-09-14 NOTE — Evaluation (Signed)
Clinical/Bedside Swallow Evaluation Patient Details  Name: Cheyenne Valenzuela MRN: [id number] Date of Birth: [birth year]  Today's Date: 09/14/2023 Time: SLP Start Time (ACUTE ONLY): 1044 SLP Stop Time (ACUTE ONLY): 1053 SLP Time Calculation (min) (ACUTE ONLY): 9 min  Past Medical History:  Past Medical History:  Diagnosis Date   Cough 06/24/2021   Hypercholesteremia    Hypertension    Influenza A 11/21/2021   Stroke [facility])    Past Surgical History:  Past Surgical History:  Procedure Laterality Date   ABDOMINAL HYSTERECTOMY     APPENDECTOMY     HPI:  Cheyenne Valenzuela is a 67 yo female presenting to ED 12/4 with one week history of L arm and leg weakness. MRI Brain with numerous small acute infarctions in bilateral cerebellum, R temporal lobe, R and L thalamus. 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 Oral motor exam WFL. All trials of thin liquids and solids noted without s/s of dysphagia or aspiration. Continue current diet without further SLP f/u. SLP Visit Diagnosis: Dysphagia, unspecified (R13.10)    Aspiration Risk  No limitations    Diet Recommendation Regular;Thin liquid    Liquid Administration via: Cup;Straw Medication Administration: Whole meds with liquid Supervision: Patient able to self feed Compensations: Slow rate;Small sips/bites Postural Changes: Seated upright at 90 degrees    Other  Recommendations Oral Care Recommendations: Oral care BID    Recommendations for follow up therapy are one component of a multi-disciplinary discharge planning process, led by the attending physician.  Recommendations may be updated based on patient status, additional functional criteria and insurance authorization.  Follow up Recommendations No SLP follow up      Assistance Recommended at Discharge     Functional Status Assessment Patient has not had a recent decline in their functional status  Frequency and Duration            Prognosis Prognosis for improved oropharyngeal function: Good      Swallow Study   General HPI: Cheyenne Valenzuela is a 67 yo female presenting to ED 12/4 with one week history of L arm and leg weakness. MRI Brain with numerous small acute infarctions in bilateral cerebellum, R temporal lobe, R and L thalamus. 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, T2DM, HTN, A-fib on anticoagulation Type of Study: Bedside Swallow Evaluation Previous Swallow Assessment: none in chart Diet Prior to this Study: Regular;Thin liquids (Level 0) Temperature Spikes Noted: No Respiratory Status: Room air History of Recent Intubation: No Behavior/Cognition: Alert;Cooperative;Pleasant mood Oral Cavity Assessment: Within Functional Limits Oral Care Completed by SLP: No Oral Cavity - Dentition: Adequate natural dentition Vision: Functional for self-feeding Self-Feeding Abilities: Able to feed self Patient Positioning: Upright in bed Baseline Vocal Quality: Normal Volitional Cough: Strong Volitional Swallow: Able to elicit    Oral/Motor/Sensory Function Overall Oral Motor/Sensory Function: Within functional limits   Ice Chips Ice chips: Not tested   Thin Liquid Thin Liquid: Within functional limits Presentation: Straw;Self Fed    Nectar Thick Nectar Thick Liquid: Not tested   Honey Thick Honey Thick Liquid: Not tested   Puree Puree: Not tested   Solid     Solid: Within functional limits Presentation: Self Fed      Gwynneth Aliment, M.A., CF-SLP Speech Language Pathology, Acute Rehabilitation Services  Secure Chat preferred [phone]  09/14/2023,11:13 AM

## 2023-09-14 NOTE — Evaluation (Signed)
Physical Therapy Evaluation Patient Details Name: Cheyenne Valenzuela MRN: [id number] DOB: Nov 30, 1955 Today's Date: 09/14/2023  History of Present Illness  Pt is a 67 year old female who presents 09/12/23 after a week of left arm and leg weakness. NIHSS 3; MRI brain numerous small acute infarctions bil cerebellum, R temporal lobe, R and L thalamus,  PMH- right PCA stroke with residual left homonymous hemianopsia, T2DM on insulin, HTN, and atrial fibrillation on anticoagulation  Clinical Impression  PTA pt living with her children on first floor with 3 steps to enter. Pt was independent with mobility, and ADLs, has not driven since her last stroke. Pt is limited in safe mobility by L LE weakness, distal>proximal, continued L hemianopsia, and decreased balance. Pt is currently independent for bed mobility, supervision for transfers, CGA for ambulation with RW and min A for ascent/descent of steps with B UE support. PT recommending OP Neuro PT at discharge. PT will continue to follow acutely.       If plan is discharge home, recommend the following: A little help with walking and/or transfers;A little help with bathing/dressing/bathroom;Assistance with cooking/housework;Assist for transportation;Help with stairs or ramp for entrance   Can travel by private vehicle    Yes    Equipment Recommendations Rolling walker (2 wheels)     Functional Status Assessment Patient has had a recent decline in their functional status and demonstrates the ability to make significant improvements in function in a reasonable and predictable amount of time.     Precautions / Restrictions Precautions Precautions: Fall Restrictions Weight Bearing Restrictions: No      Mobility  Bed Mobility Overal bed mobility: Independent                  Transfers Overall transfer level: Needs assistance Equipment used: Rolling walker (2 wheels) Transfers: Sit to/from Stand Sit to Stand: Supervision            General transfer comment: vc for hand placement to stand with RW, good power up and self steady in RW    Ambulation/Gait Ambulation/Gait assistance: Contact guard assist Gait Distance (Feet): 150 Feet Assistive device: Rolling walker (2 wheels) Gait Pattern/deviations: Step-through pattern, Decreased dorsiflexion - left, Decreased weight shift to left, Decreased stance time - left Gait velocity: slowed Gait velocity interpretation: <1.8 ft/sec, indicate of risk for recurrent falls   General Gait Details: contact guard for stability with higher level balance with walking, pt with increased dizziness with head turns and head up and down, resulting in minor instability, no overt LoB  Stairs Stairs: Yes Stairs assistance: Min assist Stair Management: Two rails, Step to pattern, Forwards Number of Stairs: 2 General stair comments: pt requiring light min A for stabilizing with ascent/descent of 2 steps, vc for sequencing for up with good down with bad      Balance Overall balance assessment: Mild deficits observed, not formally tested                                           Pertinent Vitals/Pain Pain Assessment Pain Assessment: No/denies pain    Home Living Family/patient expects to be discharged to:: Private residence Living Arrangements: Children Available Help at Discharge: Family;Available PRN/intermittently Type of Home: House Home Access: Stairs to enter Entrance Stairs-Rails: Right;Left;Can reach both Entrance Stairs-Number of Steps: 3   Home Layout: Two level;Laundry or work area in basement;Able to live on main level  with bedroom/bathroom   Additional Comments: No DME at home, works part time taking care of another person, dtr drives her where she needs to go (hasn't driven sink her prior stroke where she lost left sided vision    Prior Function Prior Level of Function : Independent/Modified Independent                      Extremity/Trunk Assessment   Upper Extremity Assessment Upper Extremity Assessment: Defer to OT evaluation LUE Deficits / Details: mild decreased coordination when visually seen, but it is very apparant to patient due to she is left handed LUE Coordination: decreased fine motor;decreased gross motor    Lower Extremity Assessment Lower Extremity Assessment: LLE deficits/detail LLE Deficits / Details: L LE AAROM WFL, strength grossly 3/5 distally to 4/5 proximally LLE Sensation: WNL LLE Coordination: decreased fine motor       Communication   Communication Communication: No apparent difficulties  Cognition Arousal: Alert Behavior During Therapy: WFL for tasks assessed/performed Overall Cognitive Status: Within Functional Limits for tasks assessed                                          General Comments General comments (skin integrity, edema, etc.): after exhibiting dizziness with head turns and looking up and down with ambulation, performed vestibular screen, pt with increased dizziness with head turns and finger follow, pt reports she has had dizziness with head turns prior to hospitalization, PT recommended pt to inform OP Neuro PT when she goes for assessment        Assessment/Plan    PT Assessment Patient needs continued PT services  PT Problem List Decreased strength;Decreased range of motion;Decreased balance;Decreased mobility;Decreased coordination;Decreased safety awareness;Decreased knowledge of use of DME       PT Treatment Interventions DME instruction;Gait training;Stair training;Functional mobility training;Therapeutic activities;Therapeutic exercise;Balance training;Neuromuscular re-education;Cognitive remediation;Patient/family education    PT Goals (Current goals can be found in the Care Plan section)  Acute Rehab PT Goals PT Goal Formulation: With patient/family Time For Goal Achievement: 09/28/23 Potential to Achieve Goals: Good     Frequency Min 1X/week        AM-PAC PT "6 Clicks" Mobility  Outcome Measure Help needed turning from your back to your side while in a flat bed without using bedrails?: None Help needed moving from lying on your back to sitting on the side of a flat bed without using bedrails?: None Help needed moving to and from a bed to a chair (including a wheelchair)?: A Little Help needed standing up from a chair using your arms (e.g., wheelchair or bedside chair)?: A Little Help needed to walk in hospital room?: A Little Help needed climbing 3-5 steps with a railing? : A Little 6 Click Score: 20    End of Session Equipment Utilized During Treatment: Gait belt Activity Tolerance: Patient tolerated treatment well Patient left: in bed;with call bell/phone within reach;with family/visitor present;with nursing/sitter in room Nurse Communication: Mobility status PT Visit Diagnosis: Unsteadiness on feet (R26.81);Muscle weakness (generalized) (M62.81);Other abnormalities of gait and mobility (R26.89);Other symptoms and signs involving the nervous system (R29.898);Dizziness and giddiness (R42)    Time: 1610-9604 PT Time Calculation (min) (ACUTE ONLY): 19 min   Charges:   PT Evaluation $PT Eval Moderate Complexity: 1 Mod   PT General Charges $$ ACUTE PT VISIT: 1 Visit  [place] Grayer B. Beverely Risen PT, DPT Acute Rehabilitation Services Please use secure chat or  Call Office [phone]   Elon Alas [facility] 09/14/2023, 10:35 AM

## 2023-09-14 NOTE — Discharge Summary (Signed)
Name: Cheyenne Valenzuela MRN: [id number] DOB: [birth year] 67 y.o. PCP: Carmina Miller, DO  Date of Admission: 09/12/2023 11:53 AM Date of Discharge: 09/14/2023 2:50 PM Attending Physician: Dr. Lafonda Mosses  Discharge Diagnosis: Principal Problem:   Stroke [facility])    Discharge Medications: Allergies as of 09/14/2023       Reactions   Lisinopril Swelling        Medication List     STOP taking these medications    gabapentin 100 MG capsule Commonly known as: NEURONTIN       TAKE these medications    Accu-Chek Guide w/Device Kit Check blood sugar 3 times per day.   amLODipine 10 MG tablet Commonly known as: NORVASC Take 1 tablet (10 mg total) by mouth daily. What changed:  See the new instructions. Another medication with the same name was removed. Continue taking this medication, and follow the directions you see here.   apixaban 5 MG Tabs tablet Commonly known as: Eliquis Take 1 tablet (5 mg total) by mouth 2 (two) times daily.   atorvastatin 80 MG tablet Commonly known as: Lipitor Take 1 tablet (80 mg total) by mouth daily.   carvedilol 12.5 MG tablet Commonly known as: COREG TAKE 1 TABLET(12.5 MG) BY MOUTH TWICE DAILY WITH A MEAL   Dexcom G7 Sensor Misc Change one sensor every 10 days   empagliflozin 25 MG Tabs tablet Commonly known as: Jardiance Take 1 tablet (25 mg total) by mouth daily before breakfast.   ezetimibe 10 MG tablet Commonly known as: Zetia Take 1 tablet (10 mg total) by mouth daily.   losartan 100 MG tablet Commonly known as: Cozaar Take 1 tablet (100 mg total) by mouth daily.   metFORMIN 1000 MG tablet Commonly known as: GLUCOPHAGE Take 1 tablet (1,000 mg total) by mouth 2 (two) times daily with a meal.   OneTouch Delica Plus Lancet33G Misc Check blood sugar up to 3 times a day   OneTouch Verio test strip Generic drug: glucose blood Check blood sugar up to 3 times a day.   Pen Needles 32G X 4 MM Misc 1 Needle by Does not apply route  daily.   Repatha SureClick 140 MG/ML Soaj Generic drug: Evolocumab Inject 140 mg into the skin every 14 (fourteen) days.   spironolactone 25 MG tablet Commonly known as: Aldactone Take 1 tablet (25 mg total) by mouth daily.   Trulicity 0.75 MG/0.5ML Soaj Generic drug: Dulaglutide ADMINISTER 0.75 MG UNDER THE SKIN 1 TIME A WEEK               Durable Medical Equipment  (From admission, onward)           Start     Ordered   09/14/23 1045  For home use only DME Walker rolling  Once       Question Answer Comment  Walker: With 5 Inch Wheels   Patient needs a walker to treat with the following condition Stroke (HCC)      09/14/23 1044   09/14/23 0000  For home use only DME 4 wheeled rolling walker with seat       Question Answer Comment  Patient needs a walker to treat with the following condition Stroke [facility])   Patient needs a walker to treat with the following condition Balance problems      09/14/23 1140            Disposition and follow-up:   [name] was discharged from [facility] in  Stable condition.  At the hospital follow up visit please address:  1.  Follow-up:  Subacute left cerebellar infarct - Patient with significant imaging findings that correlate with her deficits. Should be getting outpatient PT/OT and have RW at home. Please ensure left sided weakness is improving and no new issues. Patient received education regarding signs of stroke. Please ensure patient is taking her Eliquis 5 mg BID and home cholesterol medications for stroke prevention.   T2DM - Patient with elevated Hgb A1c  from 6.7% in June to 8.5% on admission on home regimen (Lantus 10 units every morning, metformin 1000 mg twice daily, and Trulicity 0.75 mg weekly), please adjust T2DM regimen for better glucose control.   Hypertension - BP intermittently elevated on day of discharge. Patient resumed on home antihypertensives on hospital day #1. Please recheck  BP and ensure patient is taking antihypertensives consistently (amlodipine 10 mg daily, carvedilol 12.5 mg BID, losartan 100 mg daily, and spironolactone 25 mg daily).     Atrial fibrillation  - Stable, patient asymptomatic during admission. Please ensure patient is taking Eliquis 5 mg BID and carvedilol 12.5 mg BID consistently and has refills.    2.  Labs / imaging needed at time of follow-up: BMP, CBC  3.  Pending labs/ test needing follow-up: n/a  4.  Medication Changes  STOPPED  - Gabapentin (patient not taking)    ADDED  - n/a   MODIFIED  - n/a  Follow-up Appointments:  Follow-up Information     [facility] Follow up.   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    Subacute left cerebellar infarct Patient with CT and MRI brain concerning for stroke that correlates with deficits. Neurology consulted in ED, patient outside window of interventions. 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 Echo 12/5 with LVEF 60-65%, left atrial size moderately dilated, no evidence of mitral or aortic stenosis. Neurology recommended patient continue with Eliquis 5 mg BID and home cholesterol medications.   Patient with stable deficits on daily physical exams. OT and PT recommending outpatient PT/OT, SLP determined  swallowing function WNL and did not recommend any follow up outpatient. Patient has expressed understanding regarding the importance of taking her Eliquis BID consistently, as well as her other home medications including atorvastatin 100 mg daily, Zetia 10 mg daily, amlodipine 10 mg daily, carvedilol 12.5 BID with meals, losartan 100 mg daily, spironolactone 25 mg daily, and Jardiance 25 mg daily which were resumed on admission. Ezetimibe was continued on discharge.   Urinary urgency Mild leukocytosis but no fevers, bacteria or white blood cells on urinalysis. Denied any issues during admission. Did not repeat UA on admission.    T2DM Hgb A1c on admission 8.5%, previous 6.7% on 03/12/2023.  Home regimen includes Lantus 10 units every  morning, metformin 1000 mg twice daily, and Trulicity 0.75 mg weekly. Placed on Lantus 10 units daily and moderate SSI during admission. Will need outpatient follow up for improved control.    Hypertension Blood pressures here 120s-190s/79s-110s. Patient outside the window for permissive hypertension. Resumed home antihypertensives 12/5.  Continued amlodipine 10 mg daily, carvedilol 12.5 mg BID, losartan 100 mg daily, and spironolactone 25 mg daily on admission.    Atrial fibrillation Stable, resumed home anticoagulation and beta blocker. Continued Eliquis 5 mg BID and carvedilol 12.5 mg BID with meals.    Discharge Subjective: Day #2. Patient endorses feeling well today, no new acute complaints. Daughter in room, discussed improvement in overall symptoms, still concerned about intermittent slurring of her words. Reassured that patient would be evaluated by SLP and would receive outpatient therapy if necessary. Patient ok with home therapy services, endorsed improvement in balance while ambulating with RW. Discussed importance of taking her medications consistently at home as well as coming to her hospital follow-up appointment at [facility] in a few weeks. Patient and daughter  both endorsed understanding.   Discharge Exam:   Blood pressure 133/62, pulse 65, temperature 97.8 F (36.6 C), temperature source Oral, resp. rate 17, height 5\' 3"  (1.6 m), weight 79.2 kg, SpO2 98%.  Constitutional: patient is sitting up in bed in no acute distress, answering and asking questions appropriately.  HENT: normocephalic atraumatic, mucous membranes moist. Cardiovascular: regular rate and rhythm, no m/r/g, no lower extremity edema.  Pulmonary/Chest: normal work of breathing on room air.  Abdominal: soft, non-tender, non-distended.  Neurological: alert and oriented to self, place, situation, and date. Unchanged physical exam- BUE and BLE strength 5/5, sensation intact; speech intelligible; patient with normal facial symmetry. Psych: Normal mood, flat affect.  Pertinent Labs, Studies, and Procedures:     Latest Ref Rng & Units 09/14/2023    5:31 AM 09/13/2023    4:55 AM 09/12/2023    1:08 PM  CBC  WBC 4.0 - 10.5 K/uL 11.1  10.0    Hemoglobin 12.0 - 15.0 g/dL 62.1  9.7  30.8   Hematocrit 36.0 - 46.0 % 31.0  29.7  34.0   Platelets 150 - 400 K/uL 388  395         Latest Ref Rng & Units 09/14/2023    5:31 AM 09/13/2023    4:55 AM 09/12/2023    1:08 PM  CMP  Glucose 70 - 99 mg/dL 657  846  962   BUN 8 - 23 mg/dL 31  20  23    Creatinine 0.44 - 1.00 mg/dL 9.52  8.41  3.24   Sodium 135 - 145 mmol/L 138  138  141   Potassium 3.5 - 5.1 mmol/L 3.8  3.7  4.1   Chloride 98 - 111 mmol/L 102  103  102   CO2 22 - 32 mmol/L 25  27    Calcium 8.9 - 10.3 mg/dL 9.2  9.0     ECHOCARDIOGRAM COMPLETE BUBBLE STUDY  Result Date: 09/13/2023    ECHOCARDIOGRAM REPORT   Patient Name:   Cheyenne Valenzuela Date of Exam: 09/13/2023 Medical Rec #:  [id number]   Height:       63.0 in Accession #:    [id number]  Weight:       181.4 lb Date of Birth:  Nov 14, 1955    BSA:          1.855 m Patient Age:    67 years    BP:  155/71 mmHg Patient Gender: F           HR:           80 bpm. Exam Location:  Inpatient  Procedure: 2D Echo, Cardiac Doppler and Color Doppler Indications:    Stroke  History:        Patient has prior history of Echocardiogram examinations, most                 recent 02/04/2022. Arrythmias:Atrial Fibrillation; Risk                 Factors:Hypertension, Diabetes and Dyslipidemia.  Sonographer:    Melton Krebs RDCS, FE, PE Referring Phys: [phone] DENISE A WOLFE IMPRESSIONS  1. Left ventricular ejection fraction, by estimation, is 60 to 65%. The left ventricle has normal function. The left ventricle has no regional wall motion abnormalities. There is mild concentric left ventricular hypertrophy. Left ventricular diastolic function could not be evaluated.  2. Right ventricular systolic function is normal. The right ventricular size is normal.  3. Left atrial size was moderately dilated.  4. The mitral valve is normal in structure. Trivial mitral valve regurgitation. No evidence of mitral stenosis.  5. The aortic valve is tricuspid. There is mild calcification of the aortic valve. Aortic valve regurgitation is not visualized. Aortic valve sclerosis/calcification is present, without any evidence of aortic stenosis.  6. The inferior vena cava is normal in size with greater than 50% respiratory variability, suggesting right atrial pressure of 3 mmHg.  7. Agitated saline contrast bubble study was negative, with no evidence of any interatrial shunt. FINDINGS  Left Ventricle: Left ventricular ejection fraction, by estimation, is 60 to 65%. The left ventricle has normal function. The left ventricle has no regional wall motion abnormalities. The left ventricular internal cavity size was normal in size. There is  mild concentric left ventricular hypertrophy. Left ventricular diastolic function could not be evaluated due to atrial fibrillation. Left ventricular diastolic function could not be evaluated. Right Ventricle: The right ventricular size is normal. No increase in right ventricular wall thickness. Right  ventricular systolic function is normal. Left Atrium: Left atrial size was moderately dilated. Right Atrium: Right atrial size was normal in size. Pericardium: There is no evidence of pericardial effusion. Mitral Valve: The mitral valve is normal in structure. Trivial mitral valve regurgitation. No evidence of mitral valve stenosis. Tricuspid Valve: The tricuspid valve is normal in structure. Tricuspid valve regurgitation is trivial. No evidence of tricuspid stenosis. Aortic Valve: The aortic valve is tricuspid. There is mild calcification of the aortic valve. Aortic valve regurgitation is not visualized. Aortic valve sclerosis/calcification is present, without any evidence of aortic stenosis. Aortic valve mean gradient measures 4.0 mmHg. Aortic valve peak gradient measures 8.3 mmHg. Aortic valve area, by VTI measures 2.31 cm. Pulmonic Valve: The pulmonic valve was normal in structure. Pulmonic valve regurgitation is trivial. No evidence of pulmonic stenosis. Aorta: The aortic root is normal in size and structure. Venous: The inferior vena cava is normal in size with greater than 50% respiratory variability, suggesting right atrial pressure of 3 mmHg. IAS/Shunts: No atrial level shunt detected by color flow Doppler. Agitated saline contrast was given intravenously to evaluate for intracardiac shunting. Agitated saline contrast bubble study was negative, with no evidence of any interatrial shunt.  LEFT VENTRICLE PLAX 2D LVIDd:         3.75 cm   Diastology LVIDs:         2.60 cm   LV e'  medial:    5.98 cm/s LV PW:         1.10 cm   LV E/e' medial:  20.7 LV IVS:        1.35 cm   LV e' lateral:   9.90 cm/s LVOT diam:     2.00 cm   LV E/e' lateral: 12.5 LV SV:         57 LV SV Index:   31 LVOT Area:     3.14 cm  RIGHT VENTRICLE RV S prime:     9.90 cm/s TAPSE (M-mode): 1.6 cm LEFT ATRIUM             Index        RIGHT ATRIUM          Index LA Vol (A2C):   61.2 ml 32.99 ml/m  RA Area:     9.72 cm LA Vol (A4C):   67.6  ml 36.44 ml/m  RA Volume:   18.00 ml 9.70 ml/m LA Biplane Vol: 66.4 ml 35.79 ml/m  AORTIC VALVE AV Area (Vmax):    2.15 cm AV Area (Vmean):   2.04 cm AV Area (VTI):     2.31 cm AV Vmax:           144.00 cm/s AV Vmean:          94.400 cm/s AV VTI:            0.247 m AV Peak Grad:      8.3 mmHg AV Mean Grad:      4.0 mmHg LVOT Vmax:         98.50 cm/s LVOT Vmean:        61.200 cm/s LVOT VTI:          0.182 m LVOT/AV VTI ratio: 0.74  AORTA Ao Root diam: 2.80 cm MITRAL VALVE                TRICUSPID VALVE MV Area (PHT): 5.46 cm     TR Peak grad:   21.3 mmHg MV Decel Time: 139 msec     TR Vmax:        231.00 cm/s MV E velocity: 124.00 cm/s MV A velocity: 62.70 cm/s   SHUNTS MV E/A ratio:  1.98         Systemic VTI:  0.18 m                             Systemic Diam: 2.00 cm Arvilla Meres MD Electronically signed by Arvilla Meres MD Signature Date/Time: 09/13/2023/4:38:06 PM    Final    CT ANGIO HEAD NECK W WO CM  Result Date: 09/13/2023 CLINICAL DATA:  Neuro deficit, acute, stroke suspected Stroke/TIA, determine embolic source EXAM: CT ANGIOGRAPHY HEAD AND NECK WITH AND WITHOUT CONTRAST TECHNIQUE: Multidetector CT imaging of the head and neck was performed using the standard protocol during bolus administration of intravenous contrast. Multiplanar CT image reconstructions and MIPs were obtained to evaluate the vascular anatomy. Carotid stenosis measurements (when applicable) are obtained utilizing NASCET criteria, using the distal internal carotid diameter as the denominator. RADIATION DOSE REDUCTION: This exam was performed according to the departmental dose-optimization program which includes automated exposure control, adjustment of the mA and/or kV according to patient size and/or use of iterative reconstruction technique. CONTRAST:  75mL OMNIPAQUE IOHEXOL 350 MG/ML SOLN COMPARISON:  CTA head/neck 05/14/23 FINDINGS: CT HEAD FINDINGS Brain: No hemorrhage. No hydrocephalus. No extra-axial fluid collection.  No  mass effect. No mass lesion. Redemonstrated is chronic right PCA territory infarct with an acute infarcts in the right thalamus. This is better assessed on recent prior brain MRI. No CT evidence of a new cortical infarct. Vascular: No hyperdense vessel or unexpected calcification. Skull: Normal. Negative for fracture or focal lesion. Sinuses/Orbits: No middle ear or mastoid effusion. Paranasal sinuses are clear. Bilateral lens replacement. Orbits are otherwise unremarkable. Other: None. Review of the MIP images confirms the above findings CTA NECK FINDINGS Aortic arch: Standard branching. Imaged portion shows no evidence of aneurysm or dissection. No significant stenosis of the major arch vessel origins. Right carotid system: No evidence of dissection, stenosis (50% or greater), or occlusion. Left carotid system: No evidence of dissection, stenosis (50% or greater), or occlusion. Vertebral arteries: Codominant. No evidence of dissection. Unchanged moderate stenosis of the origin of the left vertebral artery. 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 Skeleton: Negative. Other neck: Negative. Upper chest: Negative. Review of the MIP images confirms the above findings CTA HEAD FINDINGS Anterior circulation: No significant stenosis, proximal occlusion, aneurysm, or vascular malformation. Posterior circulation: The right PCA is newly occluded at the P1 segment. Left PCA is normal in appearance. Venous sinuses: As permitted by contrast timing, patent. Anatomic variants: Fetal PCA on the left. Review of the MIP images confirms the above findings IMPRESSION: 1. New occlusion of the right PCA at the P1 segment (compared to 05/14/23). 2. 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 3. Unchanged moderate stenosis of the origin of the left vertebral artery. 4. Redemonstrated is chronic right PCA territory infarct with an acute infarcts in the right thalamus. This  is better assessed on recent prior brain MRI. No CT evidence of a new cortical infarct. Electronically Signed   By: Lorenza Cambridge M.D.   On: 09/13/2023 16:27   MR BRAIN WO CONTRAST  Result Date: 09/12/2023 CLINICAL DATA:  Neuro deficit, acute, stroke suspected. Left lower extremity numbness and weakness. EXAM: MRI HEAD WITHOUT CONTRAST TECHNIQUE: Multiplanar, multiecho pulse sequences of the brain and surrounding structures were obtained without intravenous contrast. COMPARISON:  Head CT same day. MRI 05/14/2023. CT angiography 02/03/2022 and 05/14/2023. FINDINGS: Brain: Diffusion imaging shows numerous small acute infarctions scattered within both cerebellar hemispheres. Acute infarction affects the mesial temporal lobe focally on the right. Focal infarctions are present within the right thalamus. Punctate acute infarction present within the left thalamus. No other supra tentorial acute insult. Old infarctions are noted affecting the cerebellum as well. Old infarction in the right occipital lobe and posteromedial temporal lobe. Old infarctions of the right thalamus. Mild chronic small-vessel ischemic change otherwise affecting the cerebral hemispheric white matter. Findings are consistent with recurrent posterior circulation infarctions. 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 Vascular: Major vessels at the base of the brain show flow. Skull and upper cervical spine: Negative Sinuses/Orbits: Clear/normal Other: None IMPRESSION: 1. Numerous small acute infarctions scattered within both cerebellar hemispheres. Acute infarction affects the mesial temporal lobe focally on the right. Focal acute infarctions are present within the right thalamus. Punctate acute infarction present within the left thalamus. Findings are consistent with recurrent posterior circulation infarctions. 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 2. Old infarctions of the cerebellum, right  occipital lobe, right thalamus and right posteromedial temporal lobe. Electronically Signed   By: Paulina Fusi M.D.   On: 09/12/2023 18:01   CT HEAD WO CONTRAST  Result Date: 09/12/2023 CLINICAL DATA:  Weakness and numbness, stroke suspected EXAM: CT HEAD WITHOUT CONTRAST TECHNIQUE: Contiguous axial images were obtained from the base of the skull through the vertex without intravenous contrast. RADIATION DOSE REDUCTION: This exam was performed according to the departmental dose-optimization program which includes automated exposure control, adjustment of the mA and/or kV according to patient size and/or use of iterative reconstruction technique. COMPARISON:  05/14/2023 CT head FINDINGS: Brain: Hypodensity in the superior left cerebellum (series 4, image 18 and series 5, image 36) is new from the prior exam but technically age indeterminate. No evidence of acute hemorrhage, mass, mass effect, or midline shift. No hydrocephalus or extra-axial fluid collection. Redemonstrated right PCA territory, right cerebellar, and right thalamic infarcts. Vascular: No hyperdense vessel. Skull: Negative for fracture or focal lesion. Sinuses/Orbits: No acute finding. Other: The mastoid air cells are well aerated. IMPRESSION: 1. Hypodensity in the superior left cerebellum is new from the prior exam but technically age indeterminate. MRI is recommended for further evaluation. 2. Redemonstrated remote right PCA territory, right cerebellar, and right thalamic infarcts. Electronically Signed   By: Wiliam Ke M.D.   On: 09/12/2023 14:35     Discharge Instructions: Discharge Instructions     Ambulatory referral to Occupational Therapy   Complete by: As directed    Ambulatory referral to Physical Therapy   Complete by: As directed     Call MD for:   Complete by: As directed    Call MD for:  difficulty breathing, headache or visual disturbances   Complete by: As directed    Call MD for:  extreme fatigue   Complete by: As directed    Call MD for:  hives   Complete by: As directed    Call MD for:  persistant dizziness or light-headedness   Complete by: As directed    Call MD for:  persistant nausea and vomiting   Complete by: As directed    Call MD for:  redness, tenderness, or signs of infection (pain, swelling, redness, odor or green/yellow discharge around incision site)   Complete by: As directed    Call MD for:  severe uncontrolled pain   Complete by: As directed    Call MD for:  temperature >100.4   Complete by: As directed    Diet Carb Modified   Complete by: As directed    For home use only DME 4 wheeled rolling walker with seat   Complete by: As directed    Patient needs a walker to treat with the following condition:  Stroke (HCC) Balance problems     Increase activity slowly   Complete by: As directed       Signed: Yannet Rincon Colbert Coyer, MD [facility] - PGY1 Pager: [phone] 09/14/2023, 2:50 PM    Please contact the on call pager after 5 pm and on weekends at [phone].

## 2023-09-14 NOTE — Progress Notes (Signed)
   09/14/23 1200  Spiritual Encounters  Type of Visit Initial  Care provided to: Pt and family  Conversation partners present during encounter Nurse  Referral source Patient request  Reason for visit Advance directives  OnCall Visit No   Chaplain responded to request for AD. Patient's partner was present at bedside. Chaplain assisted patient with AD education. Patient said she will get it done and bring it back to [facility]. No follow-up needed at this time.

## 2023-09-14 NOTE — Discharge Instructions (Signed)
Cheyenne Valenzuela,  You were recently admitted to [facility] for left leg weakness and difficulty with balance after a stroke in the back part of your brain.  Testing here showed that an artery that supplies that part of the brain has a blockage and the best treatment for this is the medications that you are on.  Please be very diligent about taking your medications and if you are running out or run out of any of them let our clinic know right away.  You will continue to work with occupational therapy and physical therapy after you leave the hospital and we have sent referrals for these.  We have also put in an order to help you get a walker.  If you do not have this by Monday please call our office so that we can help get it.  You should seek further medical care if you have any new or worsening weakness, dizziness, or other concerning symptoms.  We recommend that you see your primary care doctor in about a week to make sure that you continue to improve and you have an appointment in the internal medicine center on 09/27/2023 2:15 PM. We are so glad that you are feeling better.  Sincerely, Rocky Morel, DO

## 2023-09-15 ENCOUNTER — Other Ambulatory Visit: Payer: Self-pay | Admitting: Student

## 2023-09-15 DIAGNOSIS — I4891 Unspecified atrial fibrillation: Secondary | ICD-10-CM

## 2023-09-15 DIAGNOSIS — Z8673 Personal history of transient ischemic attack (TIA), and cerebral infarction without residual deficits: Secondary | ICD-10-CM

## 2023-09-15 MED ORDER — APIXABAN 5 MG PO TABS: 5.0000 mg | ORAL_TABLET | Freq: Two times a day (BID) | ORAL | 11 refills | Status: AC

## 2023-09-17 ENCOUNTER — Telehealth: Payer: Self-pay | Admitting: Pharmacist

## 2023-09-17 DIAGNOSIS — E782 Mixed hyperlipidemia: Secondary | ICD-10-CM

## 2023-09-17 MED ORDER — NEXLIZET 180-10 MG PO TABS: 1.0000 | ORAL_TABLET | Freq: Every day | ORAL | 3 refills | Status: AC

## 2023-09-17 NOTE — Telephone Encounter (Signed)
Discussed lipid lab over the phone. TG  improved, LDLc still above goal  Patient reports she takes Repatha, Lipitor 80 mg and Zetia 10 mg. Never started Nexlizet. We will replace Zetia 10 gm daily to Nexlizet 180/10 mg by mouth daily  In the past tolerated nexletol well (uric acid was WNL)   Flow up fasting lipid  lab due in 3 months

## 2023-09-19 DIAGNOSIS — N2581 Secondary hyperparathyroidism of renal origin: Secondary | ICD-10-CM | POA: Diagnosis not present

## 2023-09-19 DIAGNOSIS — Z6830 Body mass index (BMI) 30.0-30.9, adult: Secondary | ICD-10-CM | POA: Diagnosis not present

## 2023-09-19 DIAGNOSIS — I10 Essential (primary) hypertension: Secondary | ICD-10-CM | POA: Diagnosis not present

## 2023-09-19 DIAGNOSIS — Z79899 Other long term (current) drug therapy: Secondary | ICD-10-CM | POA: Diagnosis not present

## 2023-09-19 DIAGNOSIS — E669 Obesity, unspecified: Secondary | ICD-10-CM | POA: Diagnosis not present

## 2023-09-19 DIAGNOSIS — D6859 Other primary thrombophilia: Secondary | ICD-10-CM | POA: Diagnosis not present

## 2023-09-19 DIAGNOSIS — Z7189 Other specified counseling: Secondary | ICD-10-CM | POA: Diagnosis not present

## 2023-09-19 DIAGNOSIS — Z8679 Personal history of other diseases of the circulatory system: Secondary | ICD-10-CM | POA: Diagnosis not present

## 2023-09-19 DIAGNOSIS — I509 Heart failure, unspecified: Secondary | ICD-10-CM | POA: Diagnosis not present

## 2023-09-19 DIAGNOSIS — E1122 Type 2 diabetes mellitus with diabetic chronic kidney disease: Secondary | ICD-10-CM | POA: Diagnosis not present

## 2023-09-19 DIAGNOSIS — Z8673 Personal history of transient ischemic attack (TIA), and cerebral infarction without residual deficits: Secondary | ICD-10-CM | POA: Diagnosis not present

## 2023-09-19 DIAGNOSIS — N183 Chronic kidney disease, stage 3 unspecified: Secondary | ICD-10-CM | POA: Diagnosis not present

## 2023-09-25 DIAGNOSIS — Z79899 Other long term (current) drug therapy: Secondary | ICD-10-CM | POA: Diagnosis not present

## 2023-09-27 ENCOUNTER — Ambulatory Visit (INDEPENDENT_AMBULATORY_CARE_PROVIDER_SITE_OTHER): Payer: Medicare (Managed Care) | Admitting: Student

## 2023-09-27 VITALS — BP 121/63 | HR 66 | Temp 97.5°F | Ht 63.0 in | Wt 175.6 lb

## 2023-09-27 DIAGNOSIS — Z794 Long term (current) use of insulin: Secondary | ICD-10-CM | POA: Diagnosis not present

## 2023-09-27 DIAGNOSIS — Z79899 Other long term (current) drug therapy: Secondary | ICD-10-CM

## 2023-09-27 DIAGNOSIS — I4891 Unspecified atrial fibrillation: Secondary | ICD-10-CM

## 2023-09-27 DIAGNOSIS — E785 Hyperlipidemia, unspecified: Secondary | ICD-10-CM | POA: Diagnosis not present

## 2023-09-27 DIAGNOSIS — Z7984 Long term (current) use of oral hypoglycemic drugs: Secondary | ICD-10-CM

## 2023-09-27 DIAGNOSIS — Z8673 Personal history of transient ischemic attack (TIA), and cerebral infarction without residual deficits: Secondary | ICD-10-CM | POA: Diagnosis not present

## 2023-09-27 DIAGNOSIS — Z7985 Long-term (current) use of injectable non-insulin antidiabetic drugs: Secondary | ICD-10-CM

## 2023-09-27 DIAGNOSIS — I6349 Cerebral infarction due to embolism of other cerebral artery: Secondary | ICD-10-CM

## 2023-09-27 DIAGNOSIS — I1A0 Resistant hypertension: Secondary | ICD-10-CM | POA: Diagnosis not present

## 2023-09-27 DIAGNOSIS — E1169 Type 2 diabetes mellitus with other specified complication: Secondary | ICD-10-CM

## 2023-09-27 DIAGNOSIS — E119 Type 2 diabetes mellitus without complications: Secondary | ICD-10-CM

## 2023-09-27 MED ORDER — ACCU-CHEK GUIDE W/DEVICE KIT
PACK | 1 refills | Status: AC
Start: 2023-09-27 — End: ?

## 2023-09-27 MED ORDER — SPIRONOLACTONE 25 MG PO TABS: 25.0000 mg | ORAL_TABLET | Freq: Every day | ORAL | 11 refills | Status: AC

## 2023-09-27 MED ORDER — EMPAGLIFLOZIN 25 MG PO TABS: 25.0000 mg | ORAL_TABLET | Freq: Every day | ORAL | 11 refills | Status: AC

## 2023-09-27 MED ORDER — METFORMIN HCL 1000 MG PO TABS: 1000.0000 mg | ORAL_TABLET | Freq: Two times a day (BID) | ORAL | 2 refills | Status: AC

## 2023-09-27 MED ORDER — ATORVASTATIN CALCIUM 80 MG PO TABS: 80.0000 mg | ORAL_TABLET | Freq: Every day | ORAL | 2 refills | Status: AC

## 2023-09-27 MED ORDER — CARVEDILOL 12.5 MG PO TABS: 12.5000 mg | ORAL_TABLET | Freq: Two times a day (BID) | ORAL | 0 refills | Status: AC

## 2023-09-27 NOTE — Assessment & Plan Note (Signed)
RRR in office today.  -Continue Eliquis

## 2023-09-27 NOTE — Assessment & Plan Note (Addendum)
LDL 09/2023 = 68 with < 55 goal for secondary prevention. Patient has been taking Repatha regularly and Lipitor intermittently.  -Refilled Lipitor

## 2023-09-27 NOTE — Assessment & Plan Note (Deleted)
Discharged earlier this month after suffering subacute left cerebellar infarct. Patient endorses slightly improved left-sided weakness but continues to have tingling in left fingertips/left foot. Physical exam unremarkable aside from slowed gait from weakness. Patient will start working with PT tomorrow. See plan for hypertension/HLD/T2DM for medication management regarding stroke risk factors.

## 2023-09-27 NOTE — Assessment & Plan Note (Signed)
Discharged earlier this month after suffering subacute left cerebellar infarct. Patient endorses slightly improved left-sided weakness but continues to have tingling in left fingertips/left foot. Physical exam unremarkable aside from slowed gait from weakness. Patient will start working with PT tomorrow. See plan for hypertension/HLD/T2DM for medication management regarding stroke risk factors.

## 2023-09-27 NOTE — Assessment & Plan Note (Addendum)
BP in office wnl. Patient had taken Amlodipine and one other medication (unsure which) for her BP.  Patient takes Coreg 12.5 ID, Spironolactone 25, Amlodipine 10, and Losartan 100 -Refilled Coreg and Spironolactone as patient had run out

## 2023-09-27 NOTE — Patient Instructions (Signed)
I have sent refills to your pharmacy as well as a referral to help with medication management.   Please return in 3 months for a follow-up.

## 2023-09-27 NOTE — Assessment & Plan Note (Addendum)
Patient with elevated A1c from 6.7 in June 2024 to 8.5 on admission earlier this month. Patient is prescribed Lantus 10 units every morning, Metformin 1000 mg twice daily, and Trulicity 0.75 mg weekly, but has not been taking above medication regularly. In regards to non-adherence, patient states stroke served as a wake-up call but also endorses some confusion regarding managing everything. Patient instructed on the importance of medication adherence and is given referral to pharmacy to help with medication management.  -Refilled Metformin -Order BMP -Order UACR

## 2023-09-27 NOTE — Progress Notes (Addendum)
CC: Admission Follow-up for subacute left cerebellar infarct  HPI:  [name] is a 67 y.o. female living with a history stated below and presents today for follow-up regarding above. Please see problem based assessment and plan for additional details.  Past Medical History:  Diagnosis Date   Cough 06/24/2021   Hypercholesteremia    Hypertension    Influenza A 11/21/2021   Stroke [facility])     Current Outpatient Medications on File Prior to Visit  Medication Sig Dispense Refill   amLODipine (NORVASC) 10 MG tablet Take 1 tablet (10 mg total) by mouth daily. 30 tablet 0   apixaban (ELIQUIS) 5 MG TABS tablet Take 1 tablet (5 mg total) by mouth 2 (two) times daily. 60 tablet 11   Bempedoic Acid-Ezetimibe (NEXLIZET) 180-10 MG TABS Take 1 tablet by mouth daily. 90 tablet 3   Continuous Glucose Sensor (DEXCOM G7 SENSOR) MISC Change one sensor every 10 days 9 each 3   Evolocumab (REPATHA SURECLICK) 140 MG/ML SOAJ Inject 140 mg into the skin every 14 (fourteen) days. 6 mL 3   glucose blood (ONETOUCH VERIO) test strip Check blood sugar up to 3 times a day. 300 each 3   Insulin Pen Needle (PEN NEEDLES) 32G X 4 MM MISC 1 Needle by Does not apply route daily. 200 each 1   Lancets (ONETOUCH DELICA PLUS LANCET33G) MISC Check blood sugar up to 3 times a day 300 each 3   losartan (COZAAR) 100 MG tablet Take 1 tablet (100 mg total) by mouth daily. 30 tablet 11   TRULICITY 0.75 MG/0.5ML SOPN ADMINISTER 0.75 MG UNDER THE SKIN 1 TIME A WEEK 2 mL 2   No current facility-administered medications on file prior to visit.    Family History  Problem Relation Age of Onset   Diabetes Mother    Diabetes Sister     Social History   Socioeconomic History   Marital status: Single    Spouse name: Not on file   Number of children: 3   Years of education: Not on file   Highest education level: Some college, no degree  Occupational History   Occupation: Retired  Tobacco Use   Smoking status: Never     Passive exposure: Never   Smokeless tobacco: Never  Vaping Use   Vaping status: Not on file  Substance and Sexual Activity   Alcohol use: No   Drug use: Never   Sexual activity: Not on file  Other Topics Concern   Not on file  Social History Narrative   Lives w daughter   Left handed   Caffeine: rarely, mainly juice      _____________________________________________         Breakfast - oatmeal with sugar, fruit, coffee or tea   Lunch - usually no lunch   Dinner - chicken or fish or [place], vegetables with broccoli and cheese, green beans, tomatoes, corn, butter, sometimes rice   Snacks - fruit   Drinks - water or tea, juice   Exercise - walking most days, 2.5 miles      Social Drivers of [employer] Strain: Low Risk  (07/04/2023)   Overall Financial Resource Strain (CARDIA)    Difficulty of Paying Living Expenses: Not very hard  Food Insecurity: No Food Insecurity (09/12/2023)   Hunger Vital Sign    Worried About Running Out of Food in the Last Year: Never true    Ran Out of Food in the Last Year: Never true  Transportation Needs: No Transportation Needs (09/12/2023)   PRAPARE - Administrator, Civil Service (Medical): No    Lack of Transportation (Non-Medical): No  Physical Activity: Insufficiently Active (07/04/2023)   Exercise Vital Sign    Days of Exercise per Week: 3 days    Minutes of Exercise per Session: 30 min  Stress: No Stress Concern Present (07/04/2023)   [employer] of Occupational Health - Occupational Stress Questionnaire    Feeling of Stress : Not at all  Social Connections: Moderately Integrated (07/04/2023)   Social Connection and Isolation Panel [NHANES]    Frequency of Communication with Friends and Family: More than three times a week    Frequency of Social Gatherings with Friends and Family: Once a week    Attends Religious Services: More than 4 times per year    Active Member of [employer] or Organizations: Yes     Attends Banker Meetings: Never    Marital Status: Widowed  Intimate Partner Violence: Not At Risk (09/13/2023)   Humiliation, Afraid, Rape, and Kick questionnaire    Fear of Current or Ex-Partner: No    Emotionally Abused: No    Physically Abused: No    Sexually Abused: No    Review of Systems: ROS negative except for what is noted on the assessment and plan.  Vitals:   09/27/23 1355  BP: 121/63  Pulse: 66  Temp: (!) 97.5 F (36.4 C)  TempSrc: Oral  SpO2: 100%  Weight: 175 lb 9.6 oz (79.7 kg)  Height: 5\' 3"  (1.6 m)    Physical Exam: Constitutional: well-appearing, sitting in chair, in no acute distress Cardiovascular: regular rate and rhythm, no m/r/g Pulmonary/Chest: normal work of breathing on room air, lungs clear to auscultation bilaterally MSK: normal bulk and tone Neurological: 5/5 strength in bilateral U/LE, sensation intact bilaterally, CN II-VII intact, mildly slowed gait Skin: warm and dry Psych: normal mood and behavior  Assessment & Plan:     Patient discussed with Dr. Mayford Knife  Type 2 diabetes mellitus with hyperlipidemia [facility]) Patient with elevated A1c from 6.7 in June 2024 to 8.5 on admission earlier this month. Patient is prescribed Lantus 10 units every morning, Metformin 1000 mg twice daily, and Trulicity 0.75 mg weekly, but has not been taking above medication regularly. In regards to non-adherence, patient states stroke served as a wake-up call but also endorses some confusion regarding managing everything. Patient instructed on the importance of medication adherence and is given referral to pharmacy to help with medication management.  -Refilled Metformin -Order BMP -Order UACR  Hx of ischemic right PCA stroke Discharged earlier this month after suffering subacute left cerebellar infarct. Patient endorses slightly improved left-sided weakness but continues to have tingling in left fingertips/left foot. Physical exam unremarkable aside  from slowed gait from weakness. Patient will start working with PT tomorrow. See plan for hypertension/HLD/T2DM for medication management regarding stroke risk factors.   Hyperlipidemia LDL 09/2023 = 68 with < 55 goal for secondary prevention. Patient has been taking Repatha regularly and Lipitor intermittently.  -Refilled Lipitor  Atrial fibrillation (HCC) RRR in office today.  -Continue Eliquis  Resistant hypertension BP in office wnl. Patient had taken Amlodipine and one other medication (unsure which) for her BP.  Patient takes Coreg 12.5 ID, Spironolactone 25, Amlodipine 10, and Losartan 100 -Refilled Coreg and Spironolactone as patient had run out  [employer], D.O. [facility] Internal Medicine, PGY-1 Phone: [phone] Date 09/27/2023 Time 3:35 PM

## 2023-09-28 LAB — CBC
Hematocrit: 34 % (ref 34.0–46.6)
Hemoglobin: 11.3 g/dL (ref 11.1–15.9)
MCH: 29 pg (ref 26.6–33.0)
MCHC: 33.2 g/dL (ref 31.5–35.7)
MCV: 87 fL (ref 79–97)
Platelets: 503 10*3/uL — ABNORMAL HIGH (ref 150–450)
RBC: 3.89 x10E6/uL (ref 3.77–5.28)
RDW: 15.2 % (ref 11.7–15.4)
WBC: 11.2 10*3/uL — ABNORMAL HIGH (ref 3.4–10.8)

## 2023-09-28 LAB — MICROALBUMIN / CREATININE URINE RATIO
Creatinine, Urine: 110.9 mg/dL
Microalb/Creat Ratio: 6 mg/g{creat} (ref 0–29)
Microalbumin, Urine: 6.5 ug/mL

## 2023-09-28 LAB — BASIC METABOLIC PANEL
BUN/Creatinine Ratio: 22 (ref 12–28)
BUN: 32 mg/dL — ABNORMAL HIGH (ref 8–27)
CO2: 22 mmol/L (ref 20–29)
Calcium: 10.2 mg/dL (ref 8.7–10.3)
Chloride: 104 mmol/L (ref 96–106)
Creatinine, Ser: 1.48 mg/dL — ABNORMAL HIGH (ref 0.57–1.00)
Glucose: 137 mg/dL — ABNORMAL HIGH (ref 70–99)
Potassium: 4.9 mmol/L (ref 3.5–5.2)
Sodium: 141 mmol/L (ref 134–144)
eGFR: 39 mL/min/{1.73_m2} — ABNORMAL LOW (ref >59)

## 2023-10-04 DIAGNOSIS — E1165 Type 2 diabetes mellitus with hyperglycemia: Secondary | ICD-10-CM | POA: Diagnosis not present

## 2023-10-04 DIAGNOSIS — Z8673 Personal history of transient ischemic attack (TIA), and cerebral infarction without residual deficits: Secondary | ICD-10-CM | POA: Diagnosis not present

## 2023-10-04 DIAGNOSIS — H35411 Lattice degeneration of retina, right eye: Secondary | ICD-10-CM | POA: Diagnosis not present

## 2023-10-04 DIAGNOSIS — H5211 Myopia, right eye: Secondary | ICD-10-CM | POA: Diagnosis not present

## 2023-10-04 DIAGNOSIS — H52202 Unspecified astigmatism, left eye: Secondary | ICD-10-CM | POA: Diagnosis not present

## 2023-10-04 DIAGNOSIS — Z794 Long term (current) use of insulin: Secondary | ICD-10-CM | POA: Diagnosis not present

## 2023-10-04 DIAGNOSIS — H53462 Homonymous bilateral field defects, left side: Secondary | ICD-10-CM | POA: Diagnosis not present

## 2023-10-04 DIAGNOSIS — H524 Presbyopia: Secondary | ICD-10-CM | POA: Diagnosis not present

## 2023-10-04 DIAGNOSIS — Z961 Presence of intraocular lens: Secondary | ICD-10-CM | POA: Diagnosis not present

## 2023-10-04 DIAGNOSIS — H40003 Preglaucoma, unspecified, bilateral: Secondary | ICD-10-CM | POA: Diagnosis not present

## 2023-10-04 DIAGNOSIS — H26491 Other secondary cataract, right eye: Secondary | ICD-10-CM | POA: Diagnosis not present

## 2023-10-04 LAB — HM DIABETES EYE EXAM

## 2023-10-08 NOTE — Progress Notes (Signed)
Internal Medicine Clinic Attending  I was physically present during the key portions of the resident provided service and participated in the medical decision making of patient's management care. I reviewed pertinent patient test results.  The assessment, diagnosis, and plan were formulated together and I agree with the documentation in the resident's note.  Williams, Julie Anne, MD  

## 2023-10-11 ENCOUNTER — Other Ambulatory Visit: Payer: Self-pay | Admitting: Pharmacist

## 2023-10-11 NOTE — Progress Notes (Signed)
   10/11/2023  Patient ID: Cheyenne Valenzuela, female   DOB: July 23, 1956, 68 y.o.   MRN: [id number]  Spoke with the patient on the phone today briefly. She was NOT at home currently, so we could not complete a medication review. Planned to re-schedule call for Monday at 4PM instead.   For preparation: Missing: Repatha , Losartan , insulin  (based on fill history) Add: Gabapentin   A1c was 9% at 10/04/23 visit and 8.5% on 09/13/23  Dexcom Clarity information attached  Aloysius Breeding, PharmD [facility] Phone Number: [phone]

## 2023-10-15 ENCOUNTER — Other Ambulatory Visit: Payer: Self-pay | Admitting: Pharmacist

## 2023-10-15 ENCOUNTER — Telehealth: Payer: Self-pay

## 2023-10-15 DIAGNOSIS — E1169 Type 2 diabetes mellitus with other specified complication: Secondary | ICD-10-CM

## 2023-10-15 NOTE — Progress Notes (Addendum)
 10/15/2023 Name: Cheyenne Valenzuela MRN: [id number] DOB: 25-May-1956  Chief Complaint  Patient presents with   Diabetes    Cheyenne Valenzuela is a 68 y.o. year old female who presented for a telephone visit.   They were referred to the pharmacist by their PCP for assistance in managing diabetes.    Subjective:  Care Team: Primary Care Provider: Marylu Gee, DO ; Next Scheduled Visit: 12/14/23 Clinical Pharmacist: Aloysius Breeding, PharmD  Medication Access/Adherence  Current Pharmacy:  [id number] - [place], [place] - [address] [place] [address] Phone: [phone] Fax: [phone]  [facility] [address] [place] [address] Phone: [phone] Fax: [phone]   Patient reports affordability concerns with their medications: No  Patient reports access/transportation concerns to their pharmacy: No  Patient reports adherence concerns with their medications:  No     Diabetes:  Current medications:  Medications tried in the past: Jardiance  25mg  daily, Tresiba  8 units if BG >180 (avg of 3 days/wk), Metformin  1000mg  (max dose of 2000mg  daily), Trulicity  0.75mg  weekly  Dexcom G7 with phone app      Patient denies hypoglycemic s/sx including dizziness, shakiness, sweating. Patient denies hyperglycemic symptoms including polyuria, polydipsia, polyphagia, nocturia, neuropathy, blurred vision.  Current medication access support: Cigna Advantage  A1c of 9% on 10/04/23   Objective:  Lab Results  Component Value Date   HGBA1C 8.5 (H) 09/13/2023    Lab Results  Component Value Date   CREATININE 1.48 (H) 09/27/2023   BUN 32 (H) 09/27/2023   NA 141 09/27/2023   K 4.9 09/27/2023   CL 104 09/27/2023   CO2 22 09/27/2023    Lab Results  Component Value Date   CHOL 129 09/13/2023   HDL 37 (L) 09/13/2023   LDLCALC 68 09/13/2023   TRIG 122 09/13/2023   CHOLHDL 3.5  09/13/2023    Medications Reviewed Today     Reviewed by Breeding Aloysius HERO, RPH (Pharmacist) on 10/15/23 at 1627  Med List Status: <None>   Medication Order Taking? Sig Documenting Provider Last Dose Status Informant  amLODipine  (NORVASC ) 10 MG tablet [id number] Yes Take 1 tablet (10 mg total) by mouth daily. Jolaine Pac, DO Taking Active   apixaban  (ELIQUIS ) 5 MG TABS tablet [id number] Yes Take 1 tablet (5 mg total) by mouth 2 (two) times daily. Jolaine Pac, DO Taking Active   atorvastatin  (LIPITOR ) 80 MG tablet [id number] Yes Take 1 tablet (80 mg total) by mouth daily. Marylu Gee, DO Taking Active   Bempedoic Acid -Ezetimibe  (NEXLIZET ) 180-10 MG TABS [id number] Yes Take 1 tablet by mouth daily. Cindie Ole DASEN, MD Taking Active   Blood Glucose Monitoring Suppl (ACCU-CHEK GUIDE) w/Device KIT [id number] Yes Check blood sugar 3 times per day. Marylu Gee, DO Taking Active   carvedilol  (COREG ) 12.5 MG tablet [id number] Yes Take 1 tablet (12.5 mg total) by mouth 2 (two) times daily with a meal. Marylu Gee, DO Taking Active   Continuous Glucose Sensor (DEXCOM G7 SENSOR) MISC [id number] Yes Change one sensor every 10 days Gomez-Caraballo, Maria, MD Taking Active Self, Pharmacy Records  empagliflozin  (JARDIANCE ) 25 MG TABS tablet [id number] Yes Take 1 tablet (25 mg total) by mouth daily before breakfast. Marylu Gee, DO Taking Active   Evolocumab  (REPATHA  SURECLICK) 140 MG/ML SOAJ [id number] Yes Inject 140 mg into the skin every 14 (fourteen) days. Cindie Ole DASEN, MD Taking Active Self, Pharmacy Records  Med Note (POWERS, Taiya Nutting M   Mon Oct 15, 2023  4:06 PM) Injects on Thursdays  glucose blood [facility] VERIO) test strip [id number] Yes Check blood sugar up to 3 times a day. Rudy Sieving, MD Taking Active Self, Pharmacy Records  insulin  degludec (TRESIBA  [facility]) 100 UNIT/ML FlexTouch Pen [id number] Yes Inject 8 Units into the skin daily. [provider] Taking Active             Med Note ELENA, Celita Aron M   Mon Oct 15, 2023  4:11 PM) If BG >180; usually about 3 days/week  Insulin  Pen Needle (PEN NEEDLES) 32G X 4 MM MISC [id number] Yes 1 Needle by Does not apply route daily. Emmy Justus DEL, MD Taking Active Self, Pharmacy Records  Lancets [facility] PLUS [place]) [place] [id number] Yes Check blood sugar up to 3 times a day Rudy Sieving, MD Taking Active Self, Pharmacy Records  losartan  (COZAAR ) 100 MG tablet [id number] Yes Take 1 tablet (100 mg total) by mouth daily. Stephanie Freund, MD Taking Active Self, Pharmacy Records  metFORMIN  (GLUCOPHAGE ) 1000 MG tablet [id number] Yes Take 1 tablet (1,000 mg total) by mouth 2 (two) times daily with a meal. Marylu Gee, DO Taking Active   spironolactone  (ALDACTONE ) 25 MG tablet [id number] Yes Take 1 tablet (25 mg total) by mouth daily. Marylu Gee, DO Taking Active   TRULICITY  0.75 MG/0.5ML NELMA [id number] Yes ADMINISTER 0.75 MG UNDER THE SKIN 1 TIME A WEEK Rudy Sieving, MD Taking Active Self, Pharmacy Records           Med Note Longton, Oluwademilade Mckiver M   Mon Oct 15, 2023  4:08 PM) Inject on Mondays              Assessment/Plan:   Diabetes: - Currently controlled - Reviewed long term cardiovascular and renal outcomes of uncontrolled blood sugar - Reviewed goal A1c, goal fasting, and goal 2 hour post prandial glucose - Recommend to check glucose with Dexcom G7    Follow Up Plan:  - Follow-up in 3 weeks to see if Freestyle Libre 3 started and new information; for future, would consider 3 month follow-up due to adherence issues prior - Ask Dr. Marylu about switching Dexcom G7 to [place] 3 due to inaccuracy with readings - Of note, patient had a fall yesterday due to legs giving out randomly  - EMS came and helped her up  - Gabapentin  made her sick so she stopped it  - Reports going to PT currently but is not helped  - Believes it is related to nerve issues, but NOT interested in new medication   Update from  10/18/23:  - Rx submitted for [employer] 3 as patient does NOT need PA according to CoverMyMeds  Update from 10/22/23: - Pharmacy reports Freestyle [place] sensors were sold yesterday on 10/21/23 - Follow-up on 11/05/23 will give us  2 weeks of data   Aloysius Breeding, PharmD [facility] Phone Number: [phone]

## 2023-10-15 NOTE — Telephone Encounter (Signed)
 FMLA paperwork has been completed and ready for pick up. Unable to reach pt and unable to LVM. Forms will be left at the front desk.

## 2023-10-18 MED ORDER — FREESTYLE LIBRE 3 SENSOR MISC: 11 refills | Status: AC

## 2023-10-22 ENCOUNTER — Encounter: Payer: Self-pay | Admitting: Dietician

## 2023-11-05 ENCOUNTER — Other Ambulatory Visit: Payer: Self-pay | Admitting: Pharmacist

## 2023-11-05 NOTE — Progress Notes (Signed)
   11/05/2023 Name: Cheyenne Valenzuela MRN: [id number] DOB: 06/30/1956  Chief Complaint  Patient presents with   Diabetes    Cheyenne Valenzuela is a 68 y.o. year old female who presented for a telephone visit.   They were referred to the pharmacist by their PCP for assistance in managing diabetes.    Subjective:  Care Team: Primary Care Provider: Carmina Miller, DO ; Next Scheduled Visit: 12/14/23 Clinical Pharmacist: Cheyenne Valenzuela, PharmD  Medication Access/Adherence  Current Pharmacy:  [facility] #[id number] - [place] - [address] [place] [address] Phone: [phone] Fax: [phone]  [facility] [address] [place] [address] Phone: [phone] Fax: [phone]   Patient reports affordability concerns with their medications: No  Patient reports access/transportation concerns to their pharmacy: No  Patient reports adherence concerns with their medications:  No     Diabetes:  Current medications:  Medications tried in the past: Jardiance 25mg  daily, Tresiba 8 units if BG >180 (avg of 3 days/wk), Metformin 1000mg  (max dose of 2000mg  daily), Trulicity 0.75mg  weekly        Patient denies hypoglycemic s/sx including dizziness, shakiness, sweating. Patient denies hyperglycemic symptoms including polyuria, polydipsia, polyphagia, nocturia, neuropathy, blurred vision.  Current medication access support: Cigna Advantage  A1c of 9% on 10/04/23   Objective:  Lab Results  Component Value Date   HGBA1C 8.5 (H) 09/13/2023    Lab Results  Component Value Date   CREATININE 1.48 (H) 09/27/2023   BUN 32 (H) 09/27/2023   NA 141 09/27/2023   K 4.9 09/27/2023   CL 104 09/27/2023   CO2 22 09/27/2023    Lab Results  Component Value Date   CHOL 129 09/13/2023   HDL 37 (L) 09/13/2023   LDLCALC 68 09/13/2023   TRIG 122 09/13/2023   CHOLHDL 3.5 09/13/2023     Medications Reviewed Today   Medications were not reviewed in this encounter       Assessment/Plan:   Diabetes: - Currently controlled - Reviewed long term cardiovascular and renal outcomes of uncontrolled blood sugar - Reviewed goal A1c, goal fasting, and goal 2 hour post prandial glucose - Recommend to check glucose with Dexcom G7 or Freestyle Libre 3 going forward    Follow Up Plan:  - No follow-up needed at this time as both CGM devices have shown BG well controlled and prior A1c was 6.7%- potentially something in error with the A1c test - Last A1c was checked at time of stroke and she had anemia which will falsely elevate her A1c - Task set to review next A1c and reach out if needed    Cheyenne Valenzuela, PharmD [facility] Phone Number: [phone]

## 2023-11-20 ENCOUNTER — Telehealth: Payer: Self-pay | Admitting: Student

## 2023-11-20 NOTE — Telephone Encounter (Signed)
FMLA paperwork for daughter sent.

## 2023-11-22 ENCOUNTER — Telehealth: Payer: Self-pay | Admitting: *Deleted

## 2023-11-22 NOTE — Telephone Encounter (Signed)
Call from Jan Communication Relations Manager [facility] [employer].  This agency has been working with patient since 09/28/2023.  Patient had a recent Stroke  and is already doing OT and PT with the Agency.  Requesting to add Speech Therapy as patient is having problems with speech.  Verbal approval was given. Will forward to PCP for approval or denial. Fax for Company if needed is [phone]. Company may send over fax for the services.

## 2023-11-26 ENCOUNTER — Other Ambulatory Visit: Payer: Self-pay | Admitting: Student

## 2023-11-26 DIAGNOSIS — I6349 Cerebral infarction due to embolism of other cerebral artery: Secondary | ICD-10-CM

## 2023-12-05 ENCOUNTER — Ambulatory Visit
Admission: RE | Admit: 2023-12-05 | Discharge: 2023-12-05 | Disposition: A | Discharge status: Home / Self Care | Payer: No Typology Code available for payment source | Source: Ambulatory Visit | Attending: Internal Medicine | Admitting: Internal Medicine

## 2023-12-05 ENCOUNTER — Inpatient Hospital Stay: Admission: RE | Admit: 2023-12-05 | Payer: No Typology Code available for payment source | Source: Ambulatory Visit

## 2023-12-05 ENCOUNTER — Ambulatory Visit
Admission: RE | Admit: 2023-12-05 | Discharge: 2023-12-05 | Disposition: A | Discharge status: Home / Self Care | Payer: Medicare (Managed Care) | Source: Ambulatory Visit | Attending: Internal Medicine | Admitting: Internal Medicine

## 2023-12-05 ENCOUNTER — Other Ambulatory Visit: Payer: Self-pay | Admitting: Internal Medicine

## 2023-12-05 DIAGNOSIS — N6011 Diffuse cystic mastopathy of right breast: Secondary | ICD-10-CM | POA: Diagnosis not present

## 2023-12-05 DIAGNOSIS — R921 Mammographic calcification found on diagnostic imaging of breast: Secondary | ICD-10-CM | POA: Diagnosis not present

## 2023-12-05 DIAGNOSIS — N6310 Unspecified lump in the right breast, unspecified quadrant: Secondary | ICD-10-CM

## 2023-12-12 DIAGNOSIS — I639 Cerebral infarction, unspecified: Secondary | ICD-10-CM | POA: Diagnosis not present

## 2023-12-12 DIAGNOSIS — R42 Dizziness and giddiness: Secondary | ICD-10-CM | POA: Diagnosis not present

## 2023-12-12 DIAGNOSIS — M6281 Muscle weakness (generalized): Secondary | ICD-10-CM | POA: Diagnosis not present

## 2023-12-12 DIAGNOSIS — R262 Difficulty in walking, not elsewhere classified: Secondary | ICD-10-CM | POA: Diagnosis not present

## 2023-12-12 DIAGNOSIS — I69852 Hemiplegia and hemiparesis following other cerebrovascular disease affecting left dominant side: Secondary | ICD-10-CM | POA: Diagnosis not present

## 2023-12-12 DIAGNOSIS — H5330 Unspecified disorder of binocular vision: Secondary | ICD-10-CM | POA: Diagnosis not present

## 2023-12-12 DIAGNOSIS — R278 Other lack of coordination: Secondary | ICD-10-CM | POA: Diagnosis not present

## 2023-12-12 DIAGNOSIS — R2681 Unsteadiness on feet: Secondary | ICD-10-CM | POA: Diagnosis not present

## 2023-12-14 ENCOUNTER — Encounter: Payer: Medicare (Managed Care) | Admitting: Student

## 2023-12-14 DIAGNOSIS — I639 Cerebral infarction, unspecified: Secondary | ICD-10-CM | POA: Diagnosis not present

## 2023-12-14 DIAGNOSIS — I69852 Hemiplegia and hemiparesis following other cerebrovascular disease affecting left dominant side: Secondary | ICD-10-CM | POA: Diagnosis not present

## 2023-12-14 DIAGNOSIS — R2681 Unsteadiness on feet: Secondary | ICD-10-CM | POA: Diagnosis not present

## 2023-12-14 DIAGNOSIS — R262 Difficulty in walking, not elsewhere classified: Secondary | ICD-10-CM | POA: Diagnosis not present

## 2023-12-14 DIAGNOSIS — R42 Dizziness and giddiness: Secondary | ICD-10-CM | POA: Diagnosis not present

## 2023-12-21 ENCOUNTER — Other Ambulatory Visit: Payer: Self-pay | Admitting: Student

## 2023-12-24 ENCOUNTER — Other Ambulatory Visit: Payer: Self-pay | Admitting: Pharmacist

## 2023-12-24 DIAGNOSIS — E782 Mixed hyperlipidemia: Secondary | ICD-10-CM

## 2023-12-25 DIAGNOSIS — Z79899 Other long term (current) drug therapy: Secondary | ICD-10-CM | POA: Diagnosis not present

## 2023-12-25 DIAGNOSIS — D649 Anemia, unspecified: Secondary | ICD-10-CM | POA: Diagnosis not present

## 2023-12-25 DIAGNOSIS — Z8679 Personal history of other diseases of the circulatory system: Secondary | ICD-10-CM | POA: Diagnosis not present

## 2023-12-25 DIAGNOSIS — Z6830 Body mass index (BMI) 30.0-30.9, adult: Secondary | ICD-10-CM | POA: Diagnosis not present

## 2023-12-25 DIAGNOSIS — N184 Chronic kidney disease, stage 4 (severe): Secondary | ICD-10-CM | POA: Diagnosis not present

## 2023-12-25 DIAGNOSIS — E669 Obesity, unspecified: Secondary | ICD-10-CM | POA: Diagnosis not present

## 2023-12-25 DIAGNOSIS — E785 Hyperlipidemia, unspecified: Secondary | ICD-10-CM | POA: Diagnosis not present

## 2023-12-25 DIAGNOSIS — D6859 Other primary thrombophilia: Secondary | ICD-10-CM | POA: Diagnosis not present

## 2023-12-25 DIAGNOSIS — Z8673 Personal history of transient ischemic attack (TIA), and cerebral infarction without residual deficits: Secondary | ICD-10-CM | POA: Diagnosis not present

## 2023-12-25 DIAGNOSIS — E1142 Type 2 diabetes mellitus with diabetic polyneuropathy: Secondary | ICD-10-CM | POA: Diagnosis not present

## 2023-12-25 DIAGNOSIS — G8310 Monoplegia of lower limb affecting unspecified side: Secondary | ICD-10-CM | POA: Diagnosis not present

## 2023-12-25 LAB — LIPID PANEL
Chol/HDL Ratio: 3 ratio (ref 0.0–4.4)
Cholesterol, Total: 128 mg/dL (ref 100–199)
HDL: 43 mg/dL (ref >39)
LDL Chol Calc (NIH): 72 mg/dL (ref 0–99)
Triglycerides: 59 mg/dL (ref 0–149)
VLDL Cholesterol Cal: 13 mg/dL (ref 5–40)

## 2023-12-28 ENCOUNTER — Telehealth: Payer: Self-pay | Admitting: Pharmacist

## 2023-12-28 NOTE — Telephone Encounter (Signed)
 Discussed lipid lab result  TG and LDLc improved. Patient takes Lipitor 80 mg daily and Nexlizet 180/10 mg daily and Repatha Q14d regularly. Advise to continue treatment. Last LDLc 72 which is still above the goal of <55 mg/dl but pt does not want to add any more meds for cholesterol.

## 2024-01-07 DIAGNOSIS — R309 Painful micturition, unspecified: Secondary | ICD-10-CM | POA: Diagnosis not present

## 2024-01-07 DIAGNOSIS — E211 Secondary hyperparathyroidism, not elsewhere classified: Secondary | ICD-10-CM | POA: Diagnosis not present

## 2024-01-07 DIAGNOSIS — E1169 Type 2 diabetes mellitus with other specified complication: Secondary | ICD-10-CM | POA: Diagnosis not present

## 2024-01-07 DIAGNOSIS — I10 Essential (primary) hypertension: Secondary | ICD-10-CM | POA: Diagnosis not present

## 2024-01-07 DIAGNOSIS — I672 Cerebral atherosclerosis: Secondary | ICD-10-CM | POA: Diagnosis not present

## 2024-01-07 DIAGNOSIS — E559 Vitamin D deficiency, unspecified: Secondary | ICD-10-CM | POA: Diagnosis not present

## 2024-01-07 DIAGNOSIS — N179 Acute kidney failure, unspecified: Secondary | ICD-10-CM | POA: Diagnosis not present

## 2024-01-07 DIAGNOSIS — R809 Proteinuria, unspecified: Secondary | ICD-10-CM | POA: Diagnosis not present

## 2024-01-07 DIAGNOSIS — D6489 Other specified anemias: Secondary | ICD-10-CM | POA: Diagnosis not present

## 2024-01-07 DIAGNOSIS — N183 Chronic kidney disease, stage 3 unspecified: Secondary | ICD-10-CM | POA: Diagnosis not present

## 2024-01-09 ENCOUNTER — Telehealth: Payer: Self-pay | Admitting: Pharmacist

## 2024-01-09 NOTE — Progress Notes (Unsigned)
   01/09/2024  Patient ID: Cheyenne Valenzuela, female   DOB: May 07, 1956, 68 y.o.   MRN: [id number]  Was planning to the call patient today to discuss hyperlipidemia concerns, however it appears patient is now seeing Hyacinth Meeker at [facility] [place] instead.   DM meds: Taking metformin + Jardiance 10mg  daily No Trulicity 0.75mg  weekly  HLD meds: Rosuvastatin 40mg  (ran out of Atorvastatin 80mg ), out from 12/23 to 3/28 of Repatha, Nexlizet ran out on 3/10  Out of Carvedilol- need refill  Last saw Hyacinth Meeker on 12/25/23 and next is on 01/14/24. A1c 12/25/23 at 7.20%   *Will send message to pool to discuss calling patient to cancel annual wellness visit, as patient is seeing another PCP now.    Ricka Burdock, PharmD [facility]  Phone Number: [phone]

## 2024-01-14 DIAGNOSIS — I130 Hypertensive heart and chronic kidney disease with heart failure and stage 1 through stage 4 chronic kidney disease, or unspecified chronic kidney disease: Secondary | ICD-10-CM | POA: Diagnosis not present

## 2024-01-14 DIAGNOSIS — N1831 Chronic kidney disease, stage 3a: Secondary | ICD-10-CM | POA: Diagnosis not present

## 2024-01-14 DIAGNOSIS — I700 Atherosclerosis of aorta: Secondary | ICD-10-CM | POA: Diagnosis not present

## 2024-01-14 DIAGNOSIS — Z8673 Personal history of transient ischemic attack (TIA), and cerebral infarction without residual deficits: Secondary | ICD-10-CM | POA: Diagnosis not present

## 2024-01-14 DIAGNOSIS — Z7984 Long term (current) use of oral hypoglycemic drugs: Secondary | ICD-10-CM | POA: Diagnosis not present

## 2024-01-14 DIAGNOSIS — Z139 Encounter for screening, unspecified: Secondary | ICD-10-CM | POA: Diagnosis not present

## 2024-01-14 DIAGNOSIS — E669 Obesity, unspecified: Secondary | ICD-10-CM | POA: Diagnosis not present

## 2024-01-14 DIAGNOSIS — G8310 Monoplegia of lower limb affecting unspecified side: Secondary | ICD-10-CM | POA: Diagnosis not present

## 2024-01-14 DIAGNOSIS — E785 Hyperlipidemia, unspecified: Secondary | ICD-10-CM | POA: Diagnosis not present

## 2024-01-16 DIAGNOSIS — I6389 Other cerebral infarction: Secondary | ICD-10-CM | POA: Diagnosis not present

## 2024-01-16 DIAGNOSIS — I6509 Occlusion and stenosis of unspecified vertebral artery: Secondary | ICD-10-CM | POA: Diagnosis not present

## 2024-01-16 DIAGNOSIS — I651 Occlusion and stenosis of basilar artery: Secondary | ICD-10-CM | POA: Diagnosis not present

## 2024-01-19 DIAGNOSIS — Z79899 Other long term (current) drug therapy: Secondary | ICD-10-CM | POA: Diagnosis not present

## 2024-01-19 DIAGNOSIS — N179 Acute kidney failure, unspecified: Secondary | ICD-10-CM | POA: Diagnosis not present

## 2024-01-19 DIAGNOSIS — R55 Syncope and collapse: Secondary | ICD-10-CM | POA: Diagnosis not present

## 2024-01-19 DIAGNOSIS — R001 Bradycardia, unspecified: Secondary | ICD-10-CM | POA: Diagnosis not present

## 2024-01-19 DIAGNOSIS — I440 Atrioventricular block, first degree: Secondary | ICD-10-CM | POA: Diagnosis not present

## 2024-01-19 DIAGNOSIS — G9389 Other specified disorders of brain: Secondary | ICD-10-CM | POA: Diagnosis not present

## 2024-01-19 DIAGNOSIS — R9431 Abnormal electrocardiogram [ECG] [EKG]: Secondary | ICD-10-CM | POA: Diagnosis not present

## 2024-01-19 DIAGNOSIS — G8190 Hemiplegia, unspecified affecting unspecified side: Secondary | ICD-10-CM | POA: Diagnosis not present

## 2024-01-19 DIAGNOSIS — Z8673 Personal history of transient ischemic attack (TIA), and cerebral infarction without residual deficits: Secondary | ICD-10-CM | POA: Diagnosis not present

## 2024-01-19 DIAGNOSIS — R531 Weakness: Secondary | ICD-10-CM | POA: Diagnosis not present

## 2024-01-19 DIAGNOSIS — R930 Abnormal findings on diagnostic imaging of skull and head, not elsewhere classified: Secondary | ICD-10-CM | POA: Diagnosis not present

## 2024-01-19 DIAGNOSIS — G459 Transient cerebral ischemic attack, unspecified: Secondary | ICD-10-CM | POA: Diagnosis not present

## 2024-01-20 DIAGNOSIS — R55 Syncope and collapse: Secondary | ICD-10-CM | POA: Diagnosis not present

## 2024-01-20 DIAGNOSIS — N179 Acute kidney failure, unspecified: Secondary | ICD-10-CM | POA: Diagnosis not present

## 2024-01-21 DIAGNOSIS — I440 Atrioventricular block, first degree: Secondary | ICD-10-CM | POA: Diagnosis not present

## 2024-01-23 DIAGNOSIS — Z7189 Other specified counseling: Secondary | ICD-10-CM | POA: Diagnosis not present

## 2024-01-23 DIAGNOSIS — E669 Obesity, unspecified: Secondary | ICD-10-CM | POA: Diagnosis not present

## 2024-01-23 DIAGNOSIS — Z6830 Body mass index (BMI) 30.0-30.9, adult: Secondary | ICD-10-CM | POA: Diagnosis not present

## 2024-01-23 DIAGNOSIS — Z79899 Other long term (current) drug therapy: Secondary | ICD-10-CM | POA: Diagnosis not present

## 2024-01-23 DIAGNOSIS — D649 Anemia, unspecified: Secondary | ICD-10-CM | POA: Diagnosis not present

## 2024-01-23 DIAGNOSIS — I10 Essential (primary) hypertension: Secondary | ICD-10-CM | POA: Diagnosis not present

## 2024-01-23 DIAGNOSIS — Z139 Encounter for screening, unspecified: Secondary | ICD-10-CM | POA: Diagnosis not present

## 2024-01-23 DIAGNOSIS — I672 Cerebral atherosclerosis: Secondary | ICD-10-CM | POA: Diagnosis not present

## 2024-01-23 DIAGNOSIS — E1122 Type 2 diabetes mellitus with diabetic chronic kidney disease: Secondary | ICD-10-CM | POA: Diagnosis not present

## 2024-01-23 DIAGNOSIS — Z8673 Personal history of transient ischemic attack (TIA), and cerebral infarction without residual deficits: Secondary | ICD-10-CM | POA: Diagnosis not present

## 2024-01-23 DIAGNOSIS — N183 Chronic kidney disease, stage 3 unspecified: Secondary | ICD-10-CM | POA: Diagnosis not present

## 2024-01-23 DIAGNOSIS — R002 Palpitations: Secondary | ICD-10-CM | POA: Diagnosis not present

## 2024-01-23 DIAGNOSIS — R55 Syncope and collapse: Secondary | ICD-10-CM | POA: Diagnosis not present

## 2024-01-23 DIAGNOSIS — E559 Vitamin D deficiency, unspecified: Secondary | ICD-10-CM | POA: Diagnosis not present

## 2024-01-23 DIAGNOSIS — E1169 Type 2 diabetes mellitus with other specified complication: Secondary | ICD-10-CM | POA: Diagnosis not present

## 2024-01-23 DIAGNOSIS — I130 Hypertensive heart and chronic kidney disease with heart failure and stage 1 through stage 4 chronic kidney disease, or unspecified chronic kidney disease: Secondary | ICD-10-CM | POA: Diagnosis not present

## 2024-02-07 DIAGNOSIS — Z961 Presence of intraocular lens: Secondary | ICD-10-CM | POA: Diagnosis not present

## 2024-02-07 DIAGNOSIS — H53462 Homonymous bilateral field defects, left side: Secondary | ICD-10-CM | POA: Diagnosis not present

## 2024-02-07 DIAGNOSIS — Z8673 Personal history of transient ischemic attack (TIA), and cerebral infarction without residual deficits: Secondary | ICD-10-CM | POA: Diagnosis not present

## 2024-02-07 DIAGNOSIS — H40003 Preglaucoma, unspecified, bilateral: Secondary | ICD-10-CM | POA: Diagnosis not present

## 2024-02-07 DIAGNOSIS — H26493 Other secondary cataract, bilateral: Secondary | ICD-10-CM | POA: Diagnosis not present

## 2024-03-14 DIAGNOSIS — I480 Paroxysmal atrial fibrillation: Secondary | ICD-10-CM | POA: Diagnosis not present

## 2024-03-14 DIAGNOSIS — Z139 Encounter for screening, unspecified: Secondary | ICD-10-CM | POA: Diagnosis not present

## 2024-03-14 DIAGNOSIS — E559 Vitamin D deficiency, unspecified: Secondary | ICD-10-CM | POA: Diagnosis not present

## 2024-03-14 DIAGNOSIS — E1142 Type 2 diabetes mellitus with diabetic polyneuropathy: Secondary | ICD-10-CM | POA: Diagnosis not present

## 2024-03-14 DIAGNOSIS — G8194 Hemiplegia, unspecified affecting left nondominant side: Secondary | ICD-10-CM | POA: Diagnosis not present

## 2024-03-14 DIAGNOSIS — D6859 Other primary thrombophilia: Secondary | ICD-10-CM | POA: Diagnosis not present

## 2024-03-14 DIAGNOSIS — E1122 Type 2 diabetes mellitus with diabetic chronic kidney disease: Secondary | ICD-10-CM | POA: Diagnosis not present

## 2024-03-14 DIAGNOSIS — Z23 Encounter for immunization: Secondary | ICD-10-CM | POA: Diagnosis not present

## 2024-03-14 DIAGNOSIS — I509 Heart failure, unspecified: Secondary | ICD-10-CM | POA: Diagnosis not present

## 2024-03-14 DIAGNOSIS — Z79899 Other long term (current) drug therapy: Secondary | ICD-10-CM | POA: Diagnosis not present

## 2024-03-14 DIAGNOSIS — I10 Essential (primary) hypertension: Secondary | ICD-10-CM | POA: Diagnosis not present

## 2024-03-14 DIAGNOSIS — E669 Obesity, unspecified: Secondary | ICD-10-CM | POA: Diagnosis not present

## 2024-03-18 DIAGNOSIS — H26492 Other secondary cataract, left eye: Secondary | ICD-10-CM | POA: Diagnosis not present

## 2024-03-19 DIAGNOSIS — E559 Vitamin D deficiency, unspecified: Secondary | ICD-10-CM | POA: Diagnosis not present

## 2024-03-19 DIAGNOSIS — I10 Essential (primary) hypertension: Secondary | ICD-10-CM | POA: Diagnosis not present

## 2024-03-19 DIAGNOSIS — Z1231 Encounter for screening mammogram for malignant neoplasm of breast: Secondary | ICD-10-CM | POA: Diagnosis not present

## 2024-03-19 DIAGNOSIS — E1169 Type 2 diabetes mellitus with other specified complication: Secondary | ICD-10-CM | POA: Diagnosis not present

## 2024-03-19 DIAGNOSIS — N183 Chronic kidney disease, stage 3 unspecified: Secondary | ICD-10-CM | POA: Diagnosis not present

## 2024-03-19 DIAGNOSIS — R809 Proteinuria, unspecified: Secondary | ICD-10-CM | POA: Diagnosis not present

## 2024-03-19 DIAGNOSIS — D6489 Other specified anemias: Secondary | ICD-10-CM | POA: Diagnosis not present

## 2024-03-19 DIAGNOSIS — R309 Painful micturition, unspecified: Secondary | ICD-10-CM | POA: Diagnosis not present

## 2024-03-21 DIAGNOSIS — R809 Proteinuria, unspecified: Secondary | ICD-10-CM | POA: Diagnosis not present

## 2024-03-21 DIAGNOSIS — E559 Vitamin D deficiency, unspecified: Secondary | ICD-10-CM | POA: Diagnosis not present

## 2024-03-21 DIAGNOSIS — N183 Chronic kidney disease, stage 3 unspecified: Secondary | ICD-10-CM | POA: Diagnosis not present

## 2024-03-21 DIAGNOSIS — D6489 Other specified anemias: Secondary | ICD-10-CM | POA: Diagnosis not present

## 2024-03-21 DIAGNOSIS — R309 Painful micturition, unspecified: Secondary | ICD-10-CM | POA: Diagnosis not present

## 2024-04-14 DIAGNOSIS — Z6830 Body mass index (BMI) 30.0-30.9, adult: Secondary | ICD-10-CM | POA: Diagnosis not present

## 2024-04-14 DIAGNOSIS — E1142 Type 2 diabetes mellitus with diabetic polyneuropathy: Secondary | ICD-10-CM | POA: Diagnosis not present

## 2024-04-14 DIAGNOSIS — E785 Hyperlipidemia, unspecified: Secondary | ICD-10-CM | POA: Diagnosis not present

## 2024-04-14 DIAGNOSIS — I130 Hypertensive heart and chronic kidney disease with heart failure and stage 1 through stage 4 chronic kidney disease, or unspecified chronic kidney disease: Secondary | ICD-10-CM | POA: Diagnosis not present

## 2024-04-14 DIAGNOSIS — Z79899 Other long term (current) drug therapy: Secondary | ICD-10-CM | POA: Diagnosis not present

## 2024-04-14 DIAGNOSIS — E663 Overweight: Secondary | ICD-10-CM | POA: Diagnosis not present

## 2024-04-14 DIAGNOSIS — E669 Obesity, unspecified: Secondary | ICD-10-CM | POA: Diagnosis not present

## 2024-04-15 DIAGNOSIS — Z961 Presence of intraocular lens: Secondary | ICD-10-CM | POA: Diagnosis not present

## 2024-04-15 DIAGNOSIS — H53462 Homonymous bilateral field defects, left side: Secondary | ICD-10-CM | POA: Diagnosis not present

## 2024-04-15 DIAGNOSIS — Z8673 Personal history of transient ischemic attack (TIA), and cerebral infarction without residual deficits: Secondary | ICD-10-CM | POA: Diagnosis not present

## 2024-05-01 DIAGNOSIS — R309 Painful micturition, unspecified: Secondary | ICD-10-CM | POA: Diagnosis not present

## 2024-05-01 DIAGNOSIS — R809 Proteinuria, unspecified: Secondary | ICD-10-CM | POA: Diagnosis not present

## 2024-05-19 DIAGNOSIS — E785 Hyperlipidemia, unspecified: Secondary | ICD-10-CM | POA: Diagnosis not present

## 2024-05-19 DIAGNOSIS — Z7984 Long term (current) use of oral hypoglycemic drugs: Secondary | ICD-10-CM | POA: Diagnosis not present

## 2024-05-19 DIAGNOSIS — E1142 Type 2 diabetes mellitus with diabetic polyneuropathy: Secondary | ICD-10-CM | POA: Diagnosis not present

## 2024-05-19 DIAGNOSIS — Z79899 Other long term (current) drug therapy: Secondary | ICD-10-CM | POA: Diagnosis not present

## 2024-05-19 DIAGNOSIS — I130 Hypertensive heart and chronic kidney disease with heart failure and stage 1 through stage 4 chronic kidney disease, or unspecified chronic kidney disease: Secondary | ICD-10-CM | POA: Diagnosis not present

## 2024-05-19 DIAGNOSIS — Z6830 Body mass index (BMI) 30.0-30.9, adult: Secondary | ICD-10-CM | POA: Diagnosis not present

## 2024-05-19 DIAGNOSIS — D649 Anemia, unspecified: Secondary | ICD-10-CM | POA: Diagnosis not present

## 2024-05-19 DIAGNOSIS — Z139 Encounter for screening, unspecified: Secondary | ICD-10-CM | POA: Diagnosis not present

## 2024-05-19 DIAGNOSIS — Z0001 Encounter for general adult medical examination with abnormal findings: Secondary | ICD-10-CM | POA: Diagnosis not present

## 2024-05-19 DIAGNOSIS — Z8673 Personal history of transient ischemic attack (TIA), and cerebral infarction without residual deficits: Secondary | ICD-10-CM | POA: Diagnosis not present

## 2024-05-19 DIAGNOSIS — E669 Obesity, unspecified: Secondary | ICD-10-CM | POA: Diagnosis not present

## 2024-05-19 DIAGNOSIS — G8314 Monoplegia of lower limb affecting left nondominant side: Secondary | ICD-10-CM | POA: Diagnosis not present

## 2024-05-26 DIAGNOSIS — R928 Other abnormal and inconclusive findings on diagnostic imaging of breast: Secondary | ICD-10-CM | POA: Diagnosis not present

## 2024-06-10 ENCOUNTER — Telehealth: Payer: Self-pay | Admitting: Pharmacy Technician

## 2024-06-10 NOTE — Telephone Encounter (Signed)
    No longer on nexletol , on nexlizet  since 09/27/23. Cancelled pa

## 2024-06-30 DIAGNOSIS — Z23 Encounter for immunization: Secondary | ICD-10-CM | POA: Diagnosis not present

## 2024-06-30 DIAGNOSIS — G8314 Monoplegia of lower limb affecting left nondominant side: Secondary | ICD-10-CM | POA: Diagnosis not present

## 2024-06-30 DIAGNOSIS — E1142 Type 2 diabetes mellitus with diabetic polyneuropathy: Secondary | ICD-10-CM | POA: Diagnosis not present

## 2024-06-30 DIAGNOSIS — Z139 Encounter for screening, unspecified: Secondary | ICD-10-CM | POA: Diagnosis not present

## 2024-06-30 DIAGNOSIS — I700 Atherosclerosis of aorta: Secondary | ICD-10-CM | POA: Diagnosis not present

## 2024-06-30 DIAGNOSIS — E785 Hyperlipidemia, unspecified: Secondary | ICD-10-CM | POA: Diagnosis not present

## 2024-06-30 DIAGNOSIS — Z79899 Other long term (current) drug therapy: Secondary | ICD-10-CM | POA: Diagnosis not present

## 2024-06-30 DIAGNOSIS — E669 Obesity, unspecified: Secondary | ICD-10-CM | POA: Diagnosis not present

## 2024-06-30 DIAGNOSIS — I130 Hypertensive heart and chronic kidney disease with heart failure and stage 1 through stage 4 chronic kidney disease, or unspecified chronic kidney disease: Secondary | ICD-10-CM | POA: Diagnosis not present

## 2024-07-23 DIAGNOSIS — R200 Anesthesia of skin: Secondary | ICD-10-CM | POA: Diagnosis not present

## 2024-07-23 DIAGNOSIS — I6930 Unspecified sequelae of cerebral infarction: Secondary | ICD-10-CM | POA: Diagnosis not present

## 2024-07-23 DIAGNOSIS — R5383 Other fatigue: Secondary | ICD-10-CM | POA: Diagnosis not present

## 2024-07-23 DIAGNOSIS — I6389 Other cerebral infarction: Secondary | ICD-10-CM | POA: Diagnosis not present

## 2024-07-23 DIAGNOSIS — I69398 Other sequelae of cerebral infarction: Secondary | ICD-10-CM | POA: Diagnosis not present

## 2024-07-25 DIAGNOSIS — I672 Cerebral atherosclerosis: Secondary | ICD-10-CM | POA: Diagnosis not present

## 2024-07-25 DIAGNOSIS — E559 Vitamin D deficiency, unspecified: Secondary | ICD-10-CM | POA: Diagnosis not present

## 2024-07-25 DIAGNOSIS — I10 Essential (primary) hypertension: Secondary | ICD-10-CM | POA: Diagnosis not present

## 2024-07-25 DIAGNOSIS — R309 Painful micturition, unspecified: Secondary | ICD-10-CM | POA: Diagnosis not present

## 2024-07-25 DIAGNOSIS — D649 Anemia, unspecified: Secondary | ICD-10-CM | POA: Diagnosis not present

## 2024-07-25 DIAGNOSIS — N183 Chronic kidney disease, stage 3 unspecified: Secondary | ICD-10-CM | POA: Diagnosis not present

## 2024-07-25 DIAGNOSIS — R809 Proteinuria, unspecified: Secondary | ICD-10-CM | POA: Diagnosis not present

## 2024-07-25 DIAGNOSIS — E1169 Type 2 diabetes mellitus with other specified complication: Secondary | ICD-10-CM | POA: Diagnosis not present

## 2024-08-05 DIAGNOSIS — Z860101 Personal history of adenomatous and serrated colon polyps: Secondary | ICD-10-CM | POA: Diagnosis not present

## 2024-08-05 DIAGNOSIS — K5730 Diverticulosis of large intestine without perforation or abscess without bleeding: Secondary | ICD-10-CM | POA: Diagnosis not present

## 2024-08-05 DIAGNOSIS — Z1211 Encounter for screening for malignant neoplasm of colon: Secondary | ICD-10-CM | POA: Diagnosis not present

## 2024-08-05 DIAGNOSIS — Z09 Encounter for follow-up examination after completed treatment for conditions other than malignant neoplasm: Secondary | ICD-10-CM | POA: Diagnosis not present

## 2024-08-05 DIAGNOSIS — I4891 Unspecified atrial fibrillation: Secondary | ICD-10-CM | POA: Diagnosis not present

## 2024-08-05 DIAGNOSIS — Z538 Procedure and treatment not carried out for other reasons: Secondary | ICD-10-CM | POA: Diagnosis not present

## 2024-08-05 DIAGNOSIS — Z860100 Personal history of colon polyps, unspecified: Secondary | ICD-10-CM | POA: Diagnosis not present

## 2024-08-08 DIAGNOSIS — D122 Benign neoplasm of ascending colon: Secondary | ICD-10-CM | POA: Diagnosis not present

## 2024-08-08 DIAGNOSIS — K635 Polyp of colon: Secondary | ICD-10-CM | POA: Diagnosis not present

## 2024-08-08 DIAGNOSIS — Z09 Encounter for follow-up examination after completed treatment for conditions other than malignant neoplasm: Secondary | ICD-10-CM | POA: Diagnosis not present

## 2024-08-08 DIAGNOSIS — Z1211 Encounter for screening for malignant neoplasm of colon: Secondary | ICD-10-CM | POA: Diagnosis not present

## 2024-08-08 DIAGNOSIS — D123 Benign neoplasm of transverse colon: Secondary | ICD-10-CM | POA: Diagnosis not present

## 2024-08-08 DIAGNOSIS — I4891 Unspecified atrial fibrillation: Secondary | ICD-10-CM | POA: Diagnosis not present

## 2024-08-08 DIAGNOSIS — Z860101 Personal history of adenomatous and serrated colon polyps: Secondary | ICD-10-CM | POA: Diagnosis not present

## 2024-08-08 DIAGNOSIS — D120 Benign neoplasm of cecum: Secondary | ICD-10-CM | POA: Diagnosis not present

## 2024-11-05 ENCOUNTER — Other Ambulatory Visit: Payer: Self-pay | Admitting: Student
